# Patient Record
Sex: Male | Born: 1955 | Race: White | Hispanic: No | Marital: Married | Smoking: Former smoker
Health system: Southern US, Community
[De-identification: ages and names within clinical notes are randomized; demographics above are authoritative.]

## PROBLEM LIST (undated history)

## (undated) DIAGNOSIS — C801 Malignant (primary) neoplasm, unspecified: Secondary | ICD-10-CM

## (undated) DIAGNOSIS — K5150 Left sided colitis without complications: Secondary | ICD-10-CM

## (undated) DIAGNOSIS — K648 Other hemorrhoids: Secondary | ICD-10-CM

## (undated) DIAGNOSIS — K219 Gastro-esophageal reflux disease without esophagitis: Secondary | ICD-10-CM

## (undated) DIAGNOSIS — E559 Vitamin D deficiency, unspecified: Secondary | ICD-10-CM

## (undated) DIAGNOSIS — E785 Hyperlipidemia, unspecified: Secondary | ICD-10-CM

## (undated) DIAGNOSIS — D649 Anemia, unspecified: Secondary | ICD-10-CM

## (undated) HISTORY — DX: Other hemorrhoids: K64.8

## (undated) HISTORY — DX: Left sided colitis without complications: K51.50

## (undated) HISTORY — DX: Vitamin D deficiency, unspecified: E55.9

## (undated) HISTORY — PX: PROSTATE SURGERY: SHX751

## (undated) HISTORY — PX: UPPER GASTROINTESTINAL ENDOSCOPY: SHX188

## (undated) HISTORY — PX: COLONOSCOPY: SHX174

## (undated) HISTORY — DX: Anemia, unspecified: D64.9

## (undated) HISTORY — DX: Hyperlipidemia, unspecified: E78.5

---

## 2001-10-12 ENCOUNTER — Emergency Department (HOSPITAL_COMMUNITY): Admission: EM | Admit: 2001-10-12 | Discharge: 2001-10-13 | Payer: Self-pay

## 2001-10-18 ENCOUNTER — Emergency Department (HOSPITAL_COMMUNITY): Admission: EM | Admit: 2001-10-18 | Discharge: 2001-10-18 | Payer: Self-pay | Admitting: *Deleted

## 2004-05-06 ENCOUNTER — Ambulatory Visit (HOSPITAL_COMMUNITY): Admission: RE | Admit: 2004-05-06 | Discharge: 2004-05-06 | Payer: Self-pay | Admitting: Internal Medicine

## 2004-05-06 IMAGING — CR DG CHEST 2V
2 series · 2 of 2 positions shown · non-contrast
Comparison: none

CLINICAL DATA: Cough.  Routine physical.
 CHEST TWO VIEWS
 No comparisons.
 The heart is not enlarged.  There is mild hyperinflation.  There is no infiltrate.  There is blunting of the left costophrenic angle which is most likely due to some scarring.
 There is a 5 mm density in the right mid lung overlying the right eighth rib posteriorly.  This appears larger than a vessel than would be expected to be in this area.  This could represent a nodule.  I would suggest a CT scan of the chest with attention to this area.  This could be performed without intravenous contrast.  
 IMPRESSION
 1.  Hyperinflation and COPD.
 2.  Blunting of the left costophrenic angle.  
 3.  Possible 5 mm nodule in the right mid lung.  Recommend CT of the chest.

[view not recorded (1 of 2)]
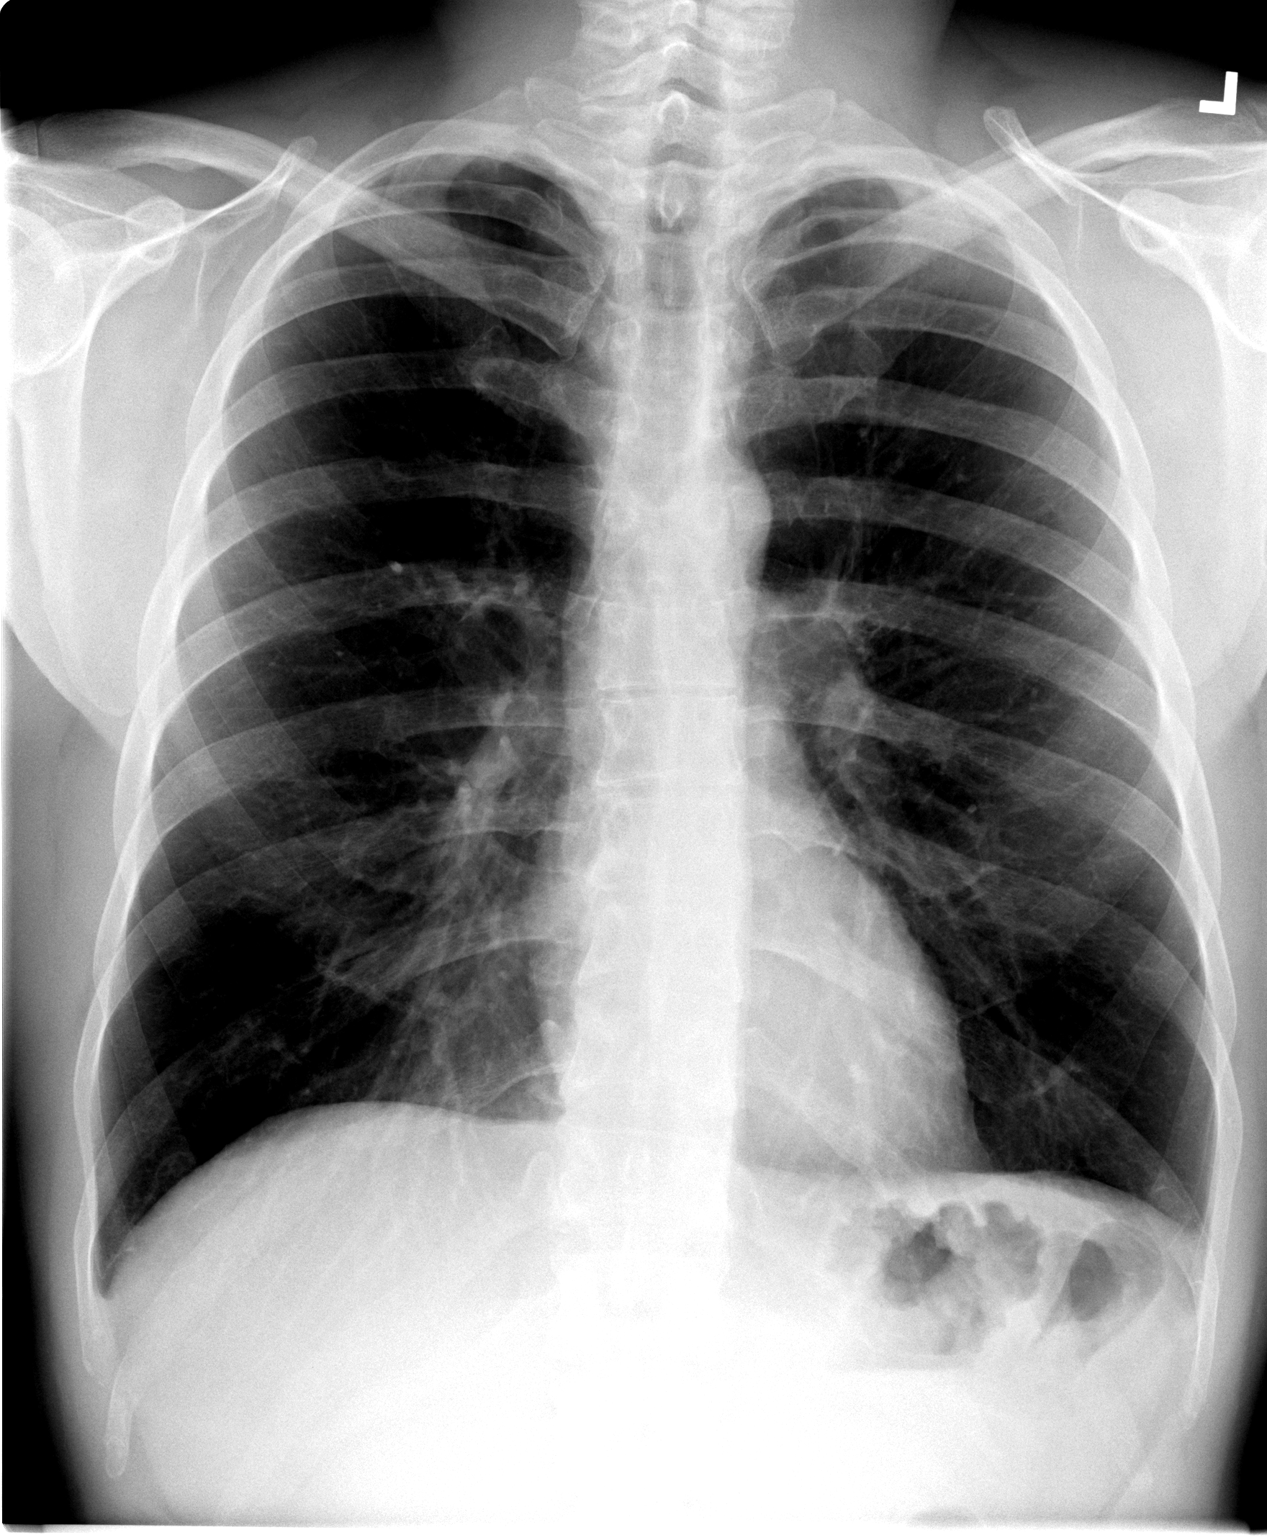

[view not recorded (2 of 2)]
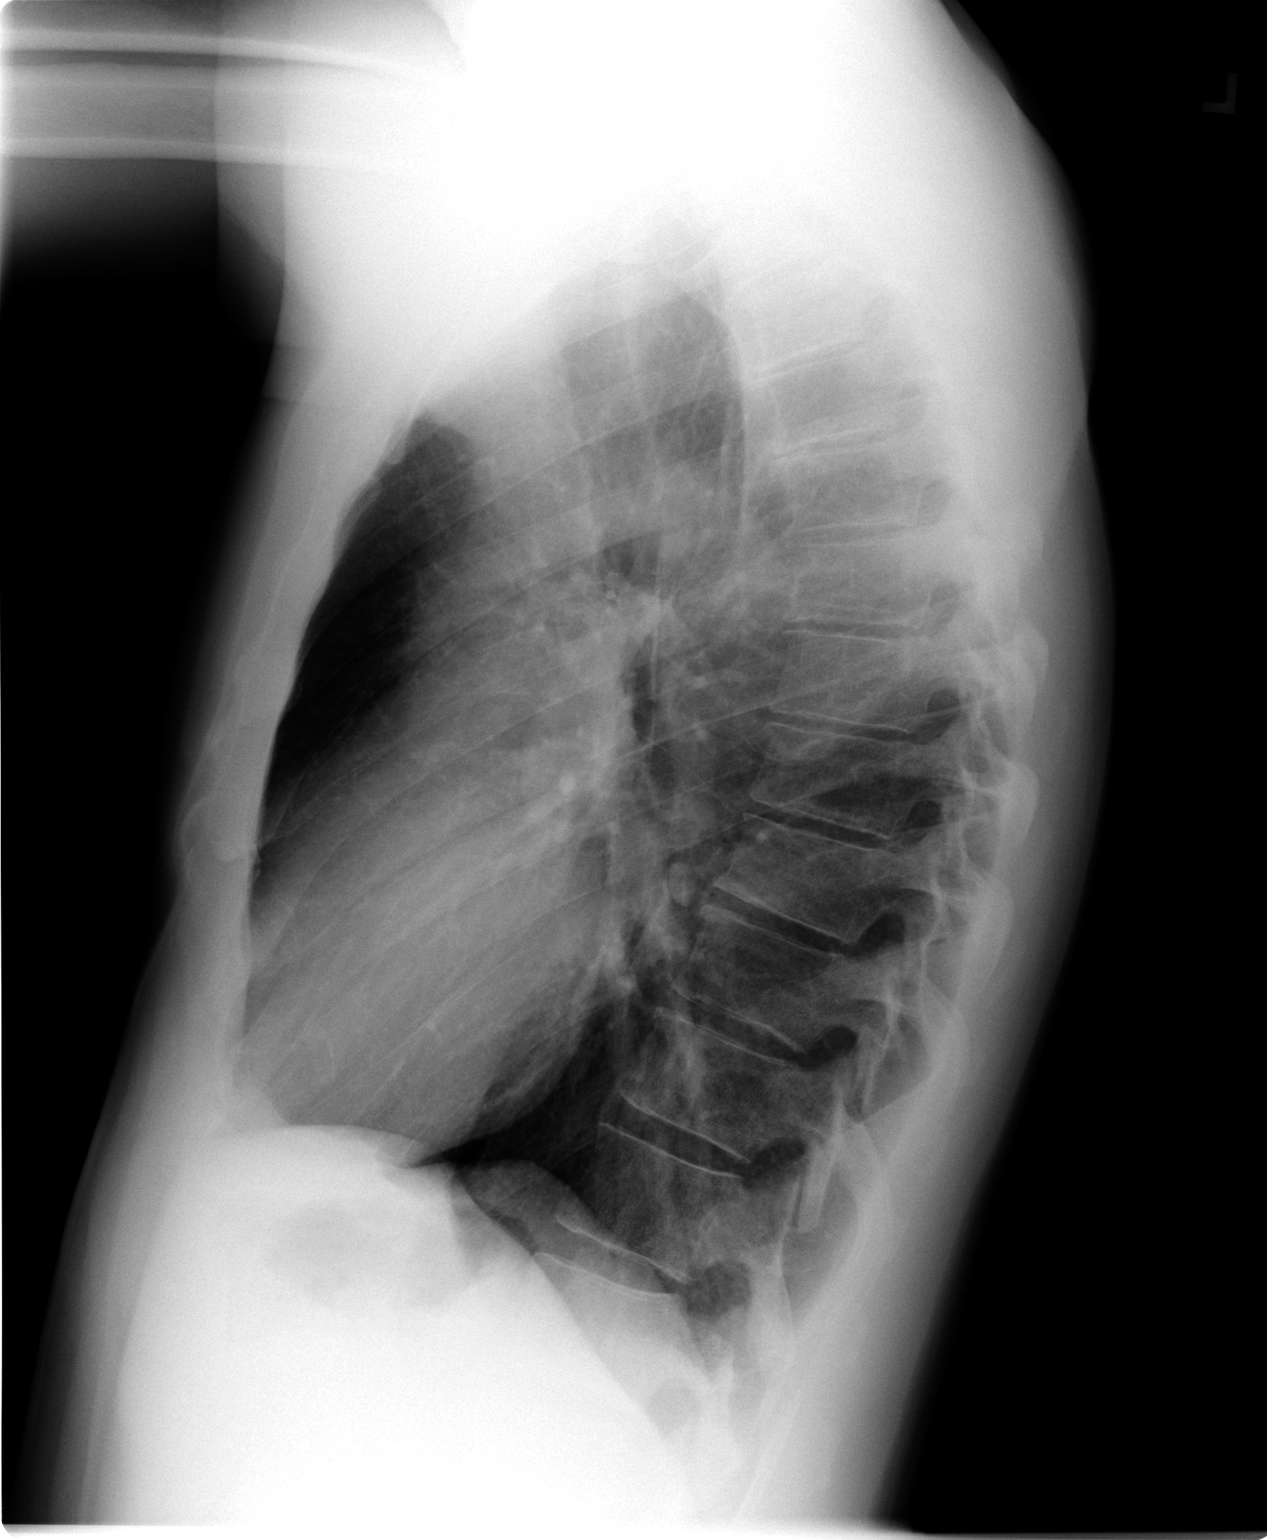

[2 of 2 positions shown; findings below may reference images not displayed]

## 2004-05-09 ENCOUNTER — Ambulatory Visit (HOSPITAL_COMMUNITY): Admission: RE | Admit: 2004-05-09 | Discharge: 2004-05-09 | Payer: Self-pay | Admitting: Internal Medicine

## 2004-05-09 IMAGING — CT CT CHEST W/O CM
1 of 2 series · 14 of 30 positions shown, 18 images · non-contrast
Comparison: none

CLINICAL DATA: Parenchymal nodule in the right mid lung on chest x-ray from [DATE].  
CT OF THE CHEST:
Comparison chest x-ray from [DATE].
Contiguous 5 mm axial images were obtained through the chest without IV contrast.  
There is no evidence of axillary, mediastinal, or hilar lymphadenopathy.  The heart size is normal.  There is no evidence for pericardial or pleural effusion.  
Lung windows demonstrate a 4 mm nodular opacity in the right mid lung, in very close proximity to the minor fissure.  This may actually be associated with the minor fissure itself.  Emphysema is noted in both lung apices.
Bones are unremarkable.

[Series 2: — · axial · 0.62mm/px · z∈[-296,-2]mm · 14 of 69 slices shown, 18 images]
[im 5/69  mediastinal]
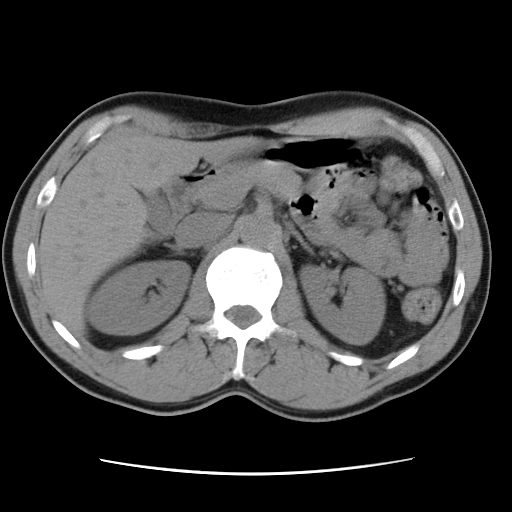
[im 5/69  lung]
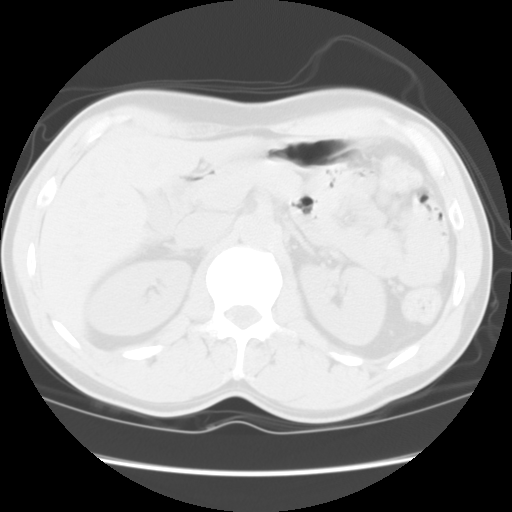
[im 10/69  lung]
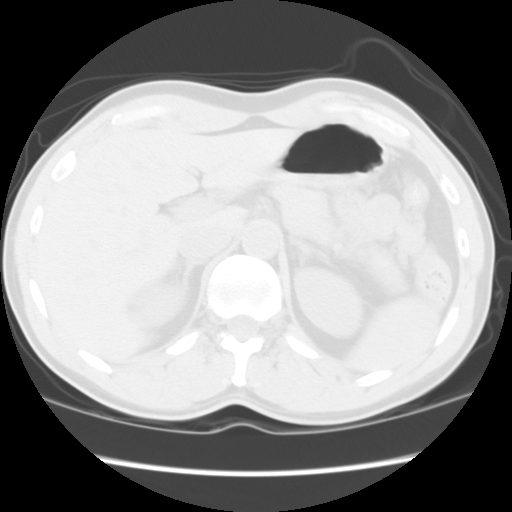
[im 15/69  lung]
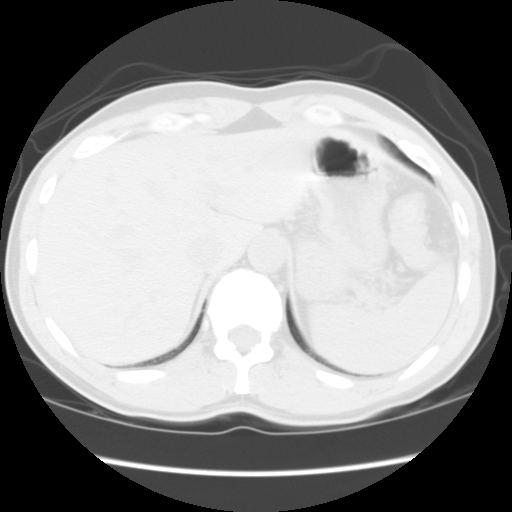
[im 20/69  lung]
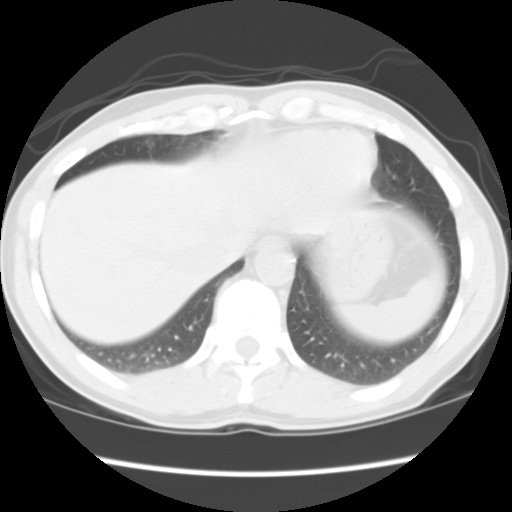
[im 25/69  mediastinal]
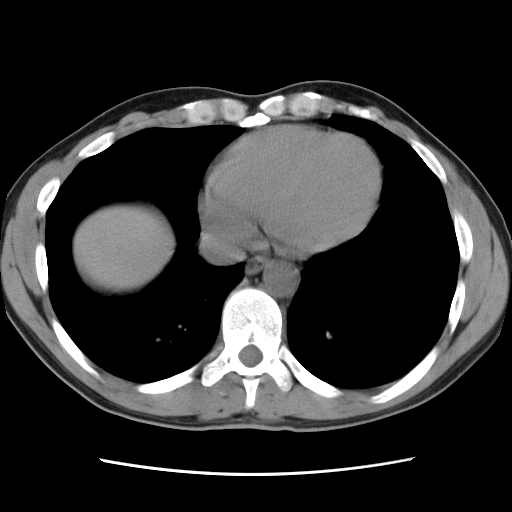
[im 25/69  lung]
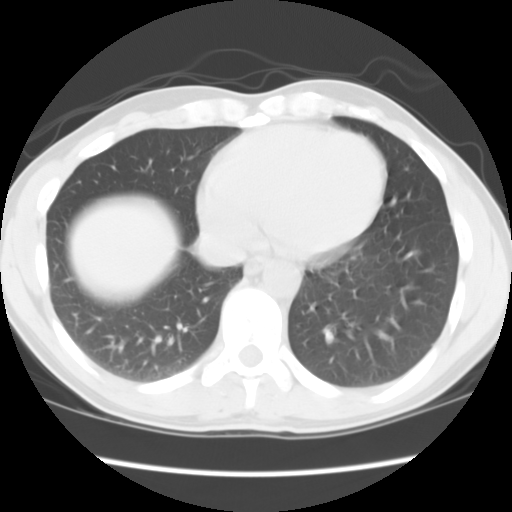
[im 30/69  lung]
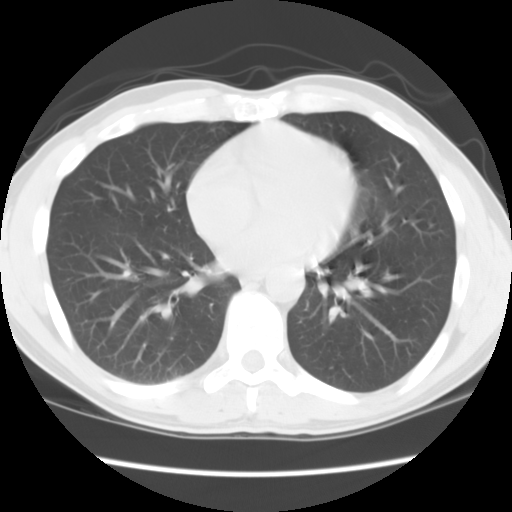
[im 33/69  lung]
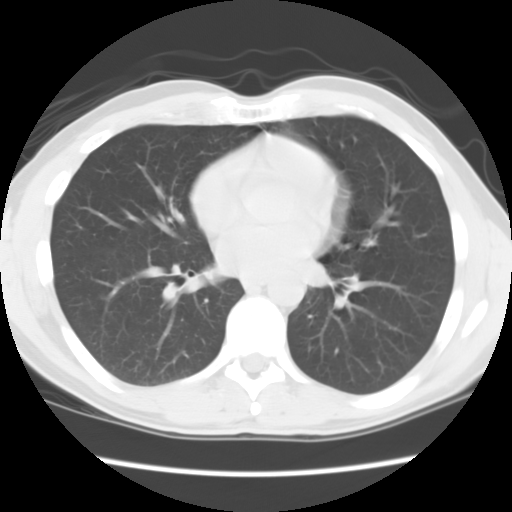
[im 35/69  lung]
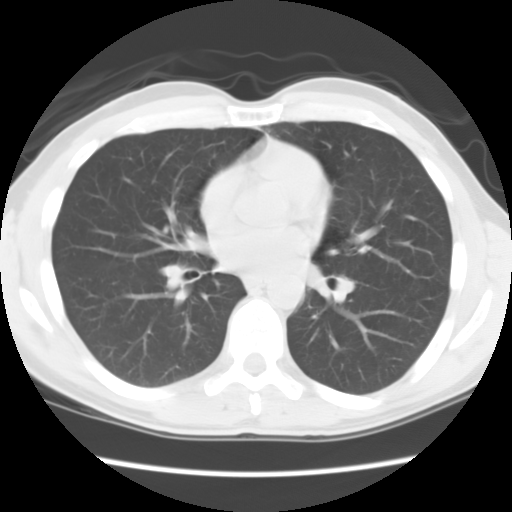
[im 39/69  mediastinal]
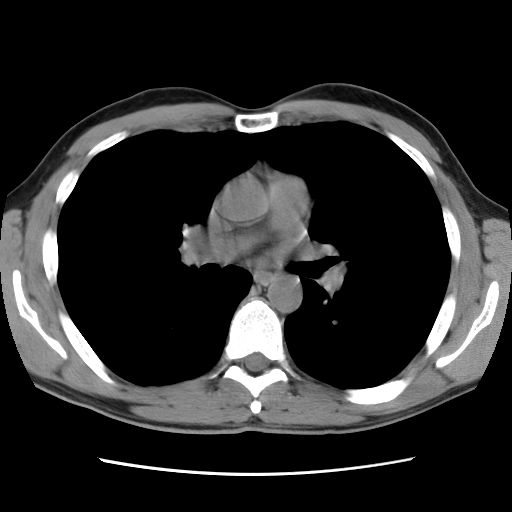
[im 39/69  lung]
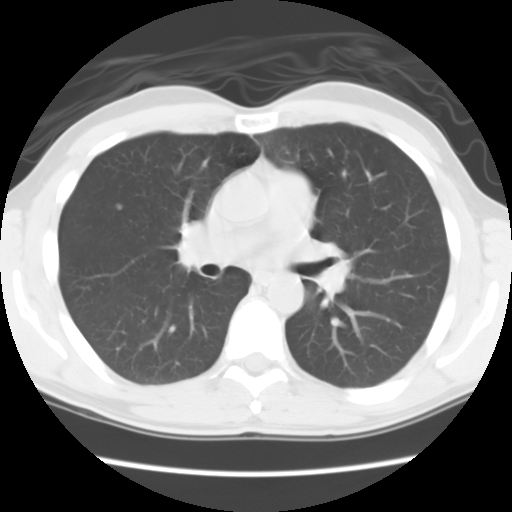
[im 44/69  lung]
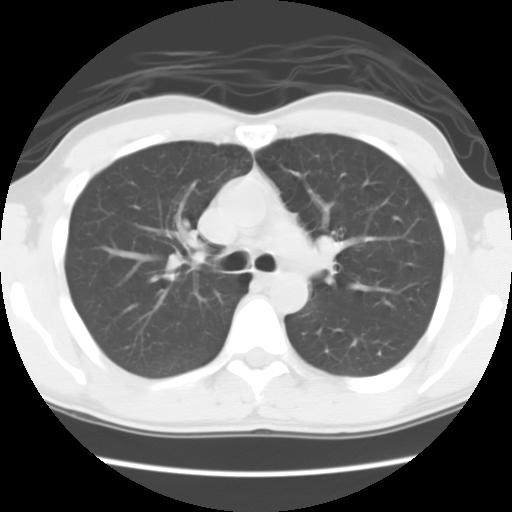
[im 49/69  lung]
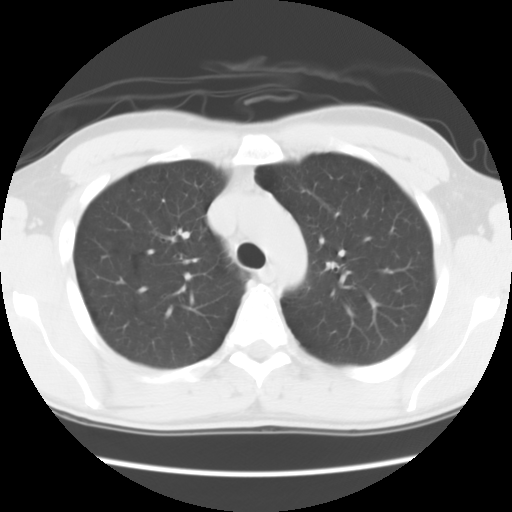
[im 54/69  lung]
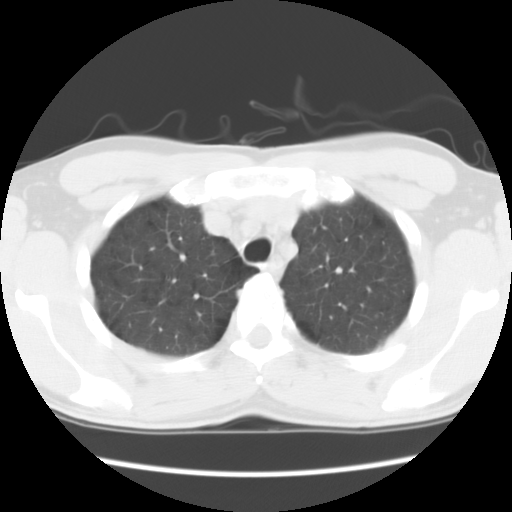
[im 59/69  mediastinal]
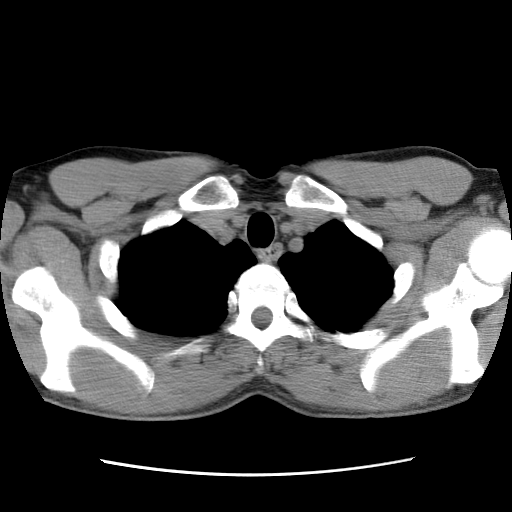
[im 59/69  lung]
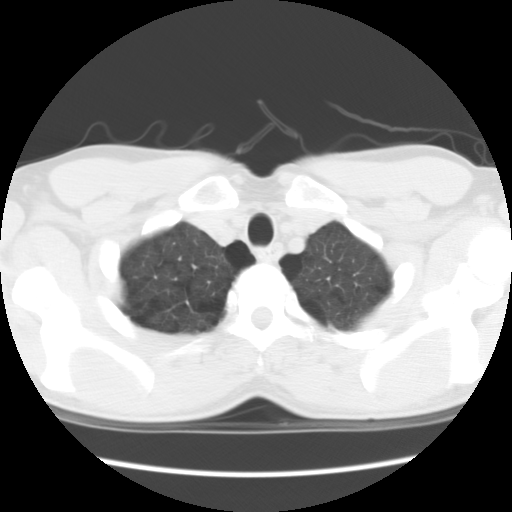
[im 64/69  lung]
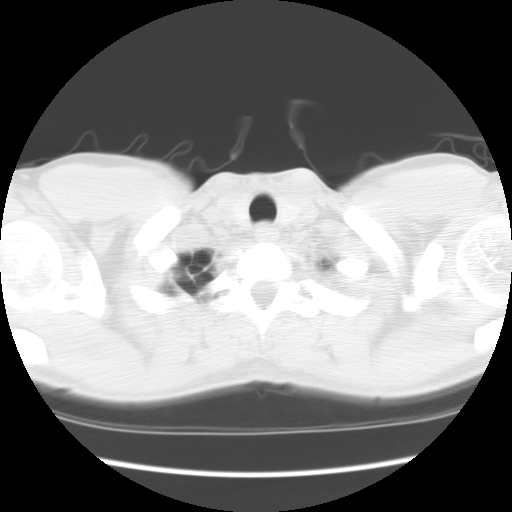

[14 of 30 positions shown; findings below may reference images not displayed]

IMPRESSION: There is a single tiny nodular density in the right mid lung with no associated calcification.  Emphysematous change in the upper lobes suggest a smoking history.  Even in the presence of a smoking history this is most likely benign, related to noncalcified granuloma or subpleural lymph node, but a follow-up CT scan in three months is recommended to insure that it remains stable as early bronchogenic neoplasm cannot be completely excluded.

## 2004-08-07 ENCOUNTER — Ambulatory Visit (HOSPITAL_COMMUNITY): Admission: RE | Admit: 2004-08-07 | Discharge: 2004-08-07 | Payer: Self-pay | Admitting: Internal Medicine

## 2004-08-07 IMAGING — CT CT CHEST W/O CM
1 of 2 series · 14 of 32 positions shown, 18 images · IV contrast (agent unspecified)
Comparison: none

CLINICAL DATA: Follow-up nodule in the right lung.
CHEST CT WITHOUT CONTRAST:
Comparison is made with the previous exam dated [DATE].
Spiral scanning was performed from the lung apex to the base of the diaphragm.  Both soft tissue and lung windows were obtained.
A normal noncontrasted appearance to the mediastinum is seen.  
The lung windows are notable for a focal nodular density identified in the right middle lobe which again appears to be closely opposed to the minor fissure.  On today?s exam this measures 4.4 mm and again shows no obvious calcification.  Given differences in scanning planes the size is felt to have remained stable since the previous exam and again this would be most compatible with a benign finding.  One further three month follow-up would be recommended to confirm stability.  The remainder of the lung fields demonstrate some early emphysematous changes with bleb formation in the upper lung zones bilaterally.  No other parenchymal abnormalities are seen.

[Series 2: — · axial · 0.61mm/px · z∈[-283,-23]mm · 14 of 63 slices shown, 18 images]
[im 5/63  mediastinal]
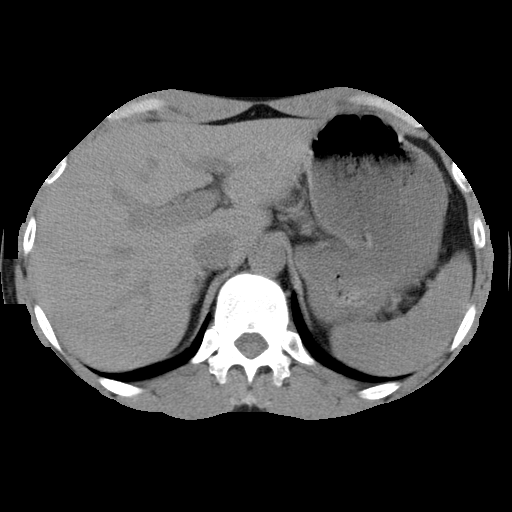
[im 5/63  lung]
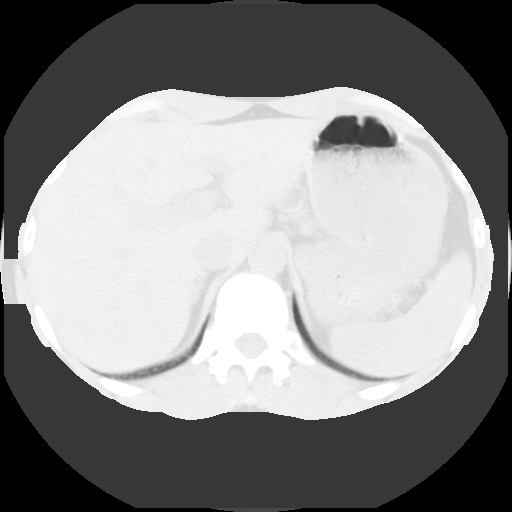
[im 10/63  lung]
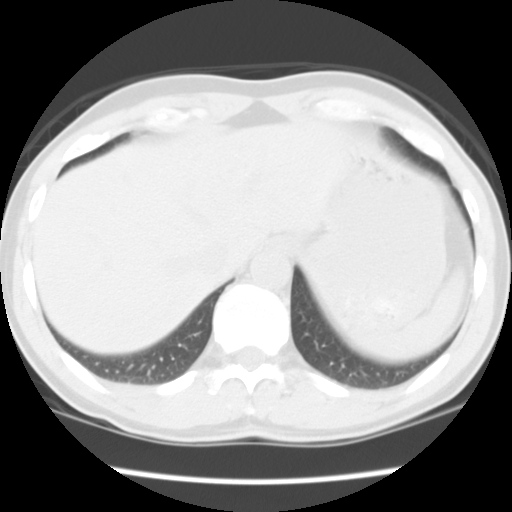
[im 15/63  lung]
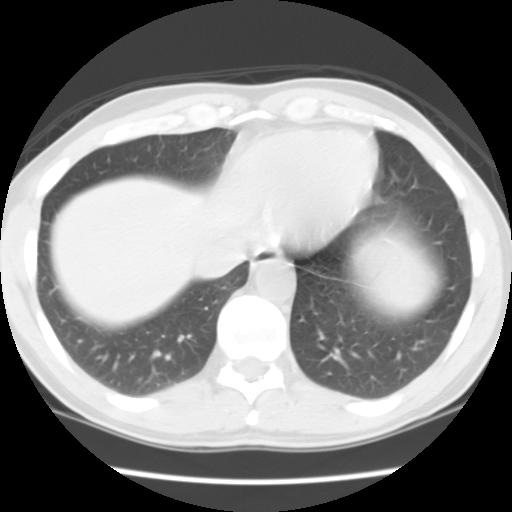
[im 20/63  lung]
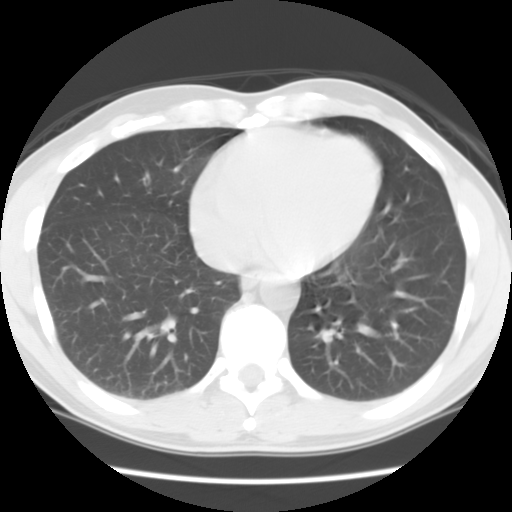
[im 24/63  mediastinal]
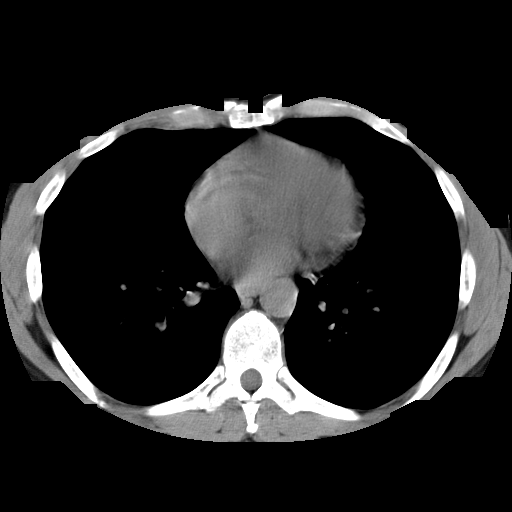
[im 24/63  lung]
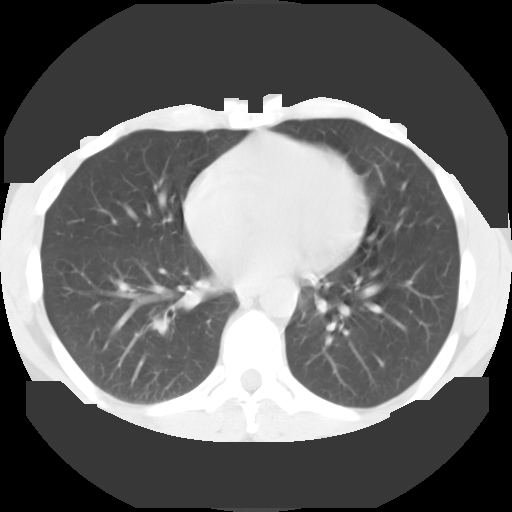
[im 29/63  lung]
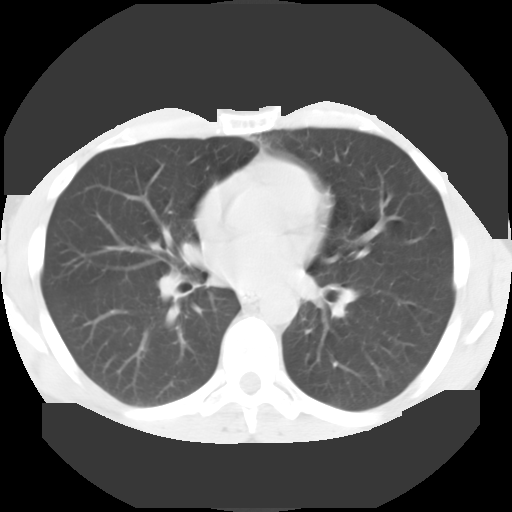
[im 30/63  lung]
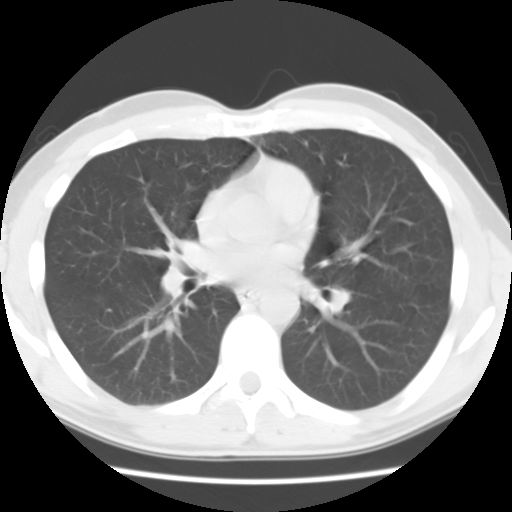
[im 32/63  lung]
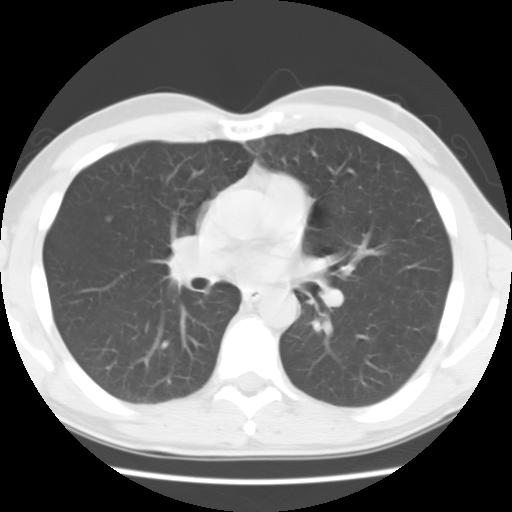
[im 34/63  mediastinal]
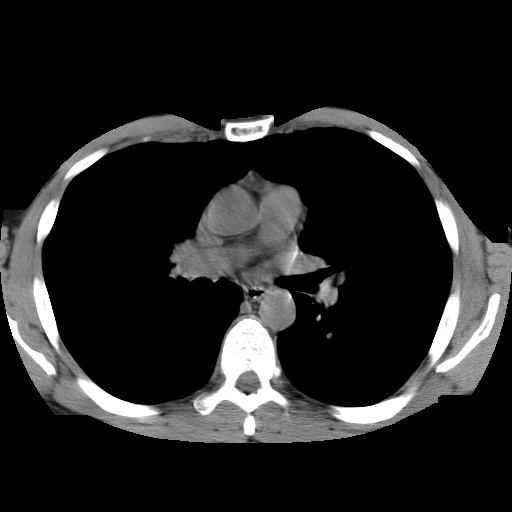
[im 34/63  lung]
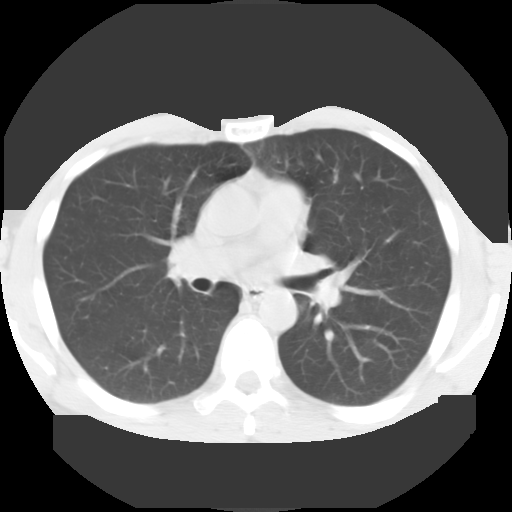
[im 39/63  lung]
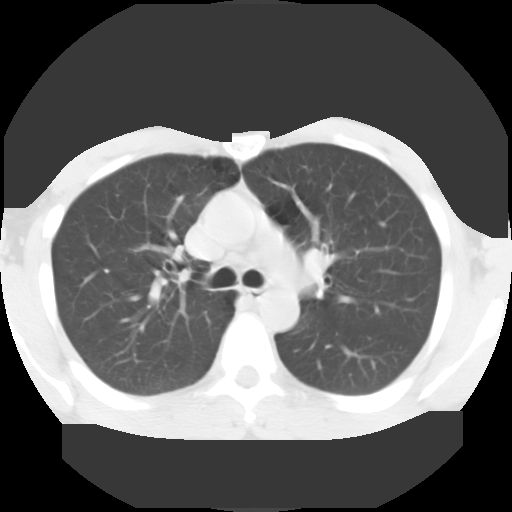
[im 43/63  lung]
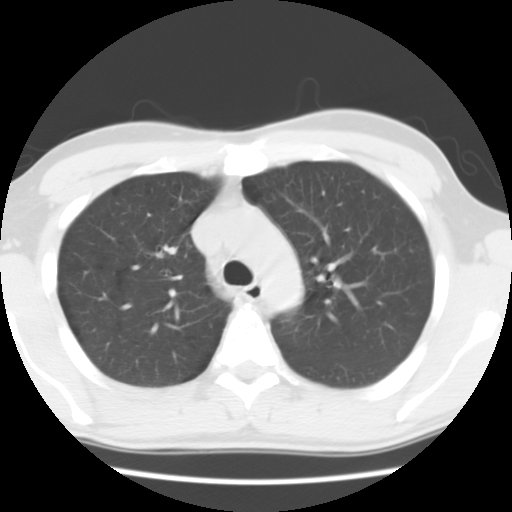
[im 48/63  lung]
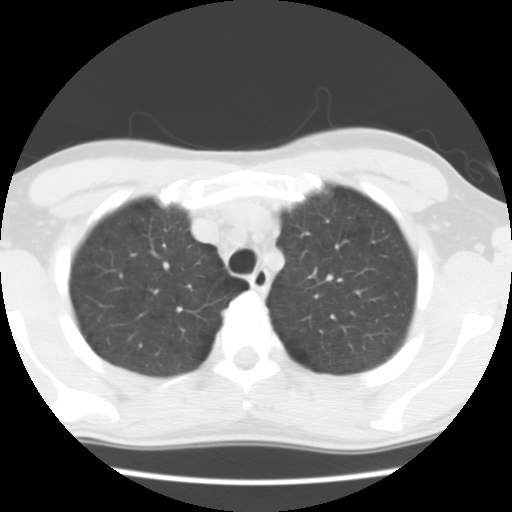
[im 53/63  mediastinal]
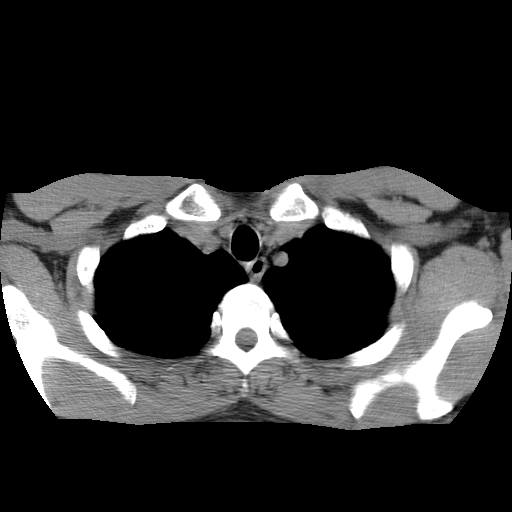
[im 53/63  lung]
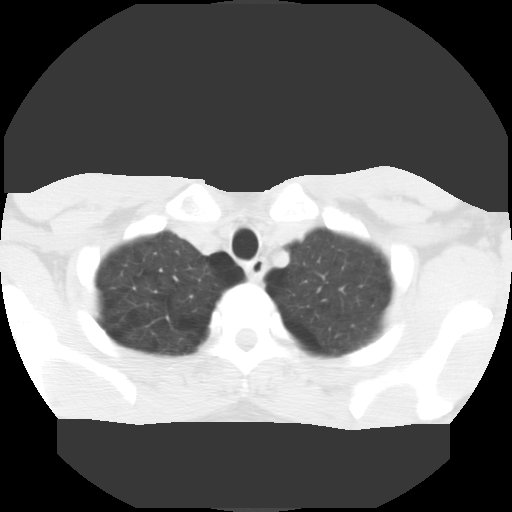
[im 58/63  lung]
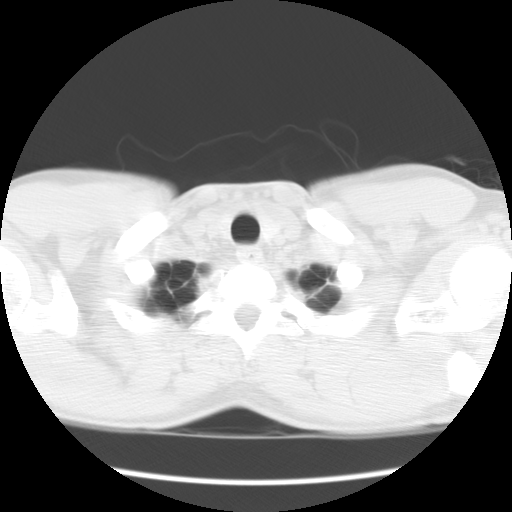

[14 of 32 positions shown; findings below may reference images not displayed]

IMPRESSION: Stable right middle lobe, non-calcified nodule most suggestive of a benign process.  One further three month follow-up is recommended to confirm stability.  Mild apical emphysematous changes.

## 2004-12-05 ENCOUNTER — Ambulatory Visit (HOSPITAL_COMMUNITY): Admission: RE | Admit: 2004-12-05 | Discharge: 2004-12-05 | Payer: Self-pay | Admitting: Internal Medicine

## 2004-12-05 IMAGING — CT CT CHEST W/O CM
1 series · 16 of 33 positions shown, 20 images · IV contrast (agent unspecified)
Comparison: [DATE].

CLINICAL DATA: Follow-up right mid lung nodule.
TECHNIQUE: Multi sized axial images were obtained through the chest, ordered and performed without contrast material.   
CT CHEST WITHOUT CONTRAST:

[Series 2: chest w/0 cm · axial · 0.66mm/px · z∈[-322,-12]mm · 16 of 68 slices shown, 20 images]
[im 3/68  mediastinal]
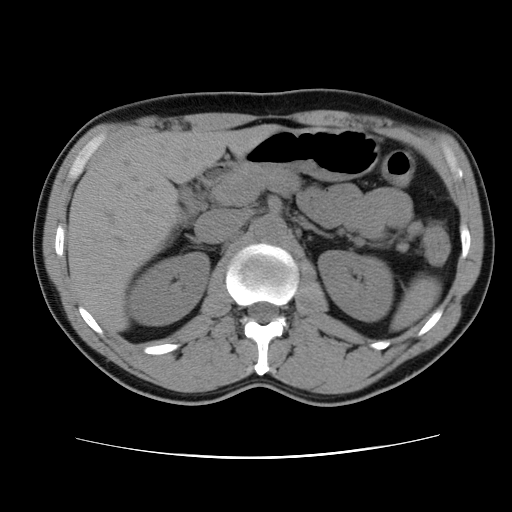
[im 3/68  lung]
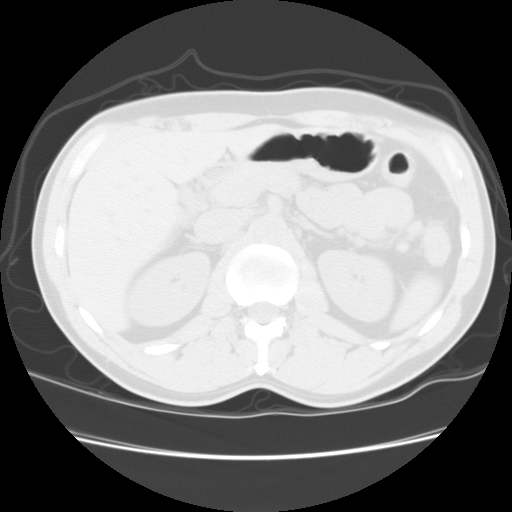
[im 8/68  lung]
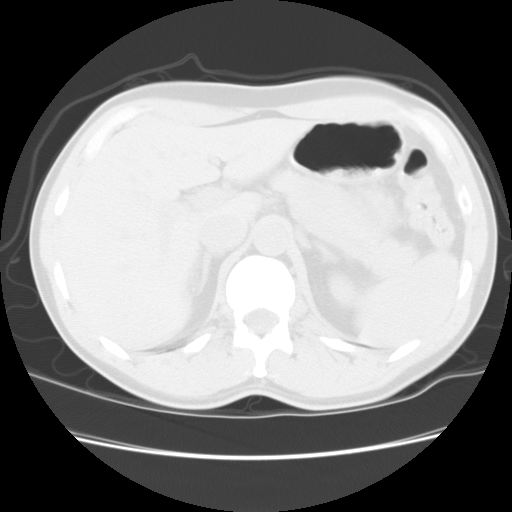
[im 13/68  lung]
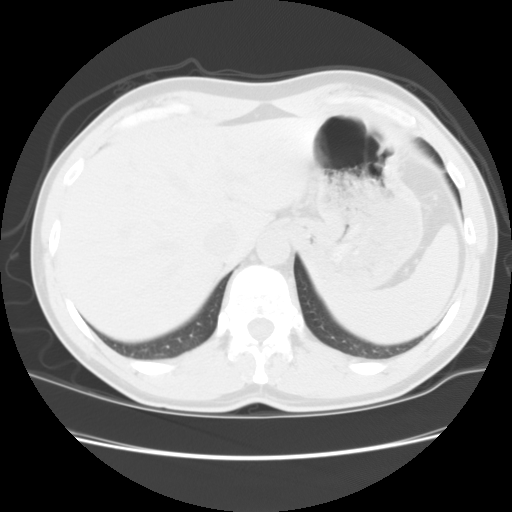
[im 15/68  lung]
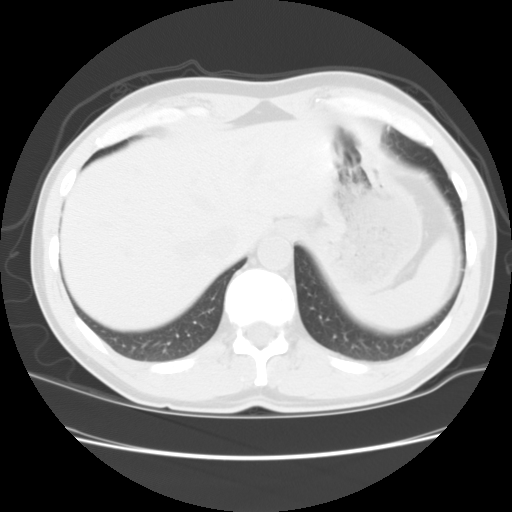
[im 20/68  mediastinal]
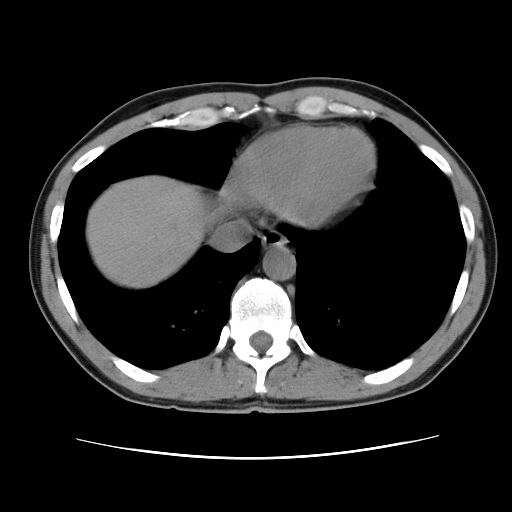
[im 20/68  lung]
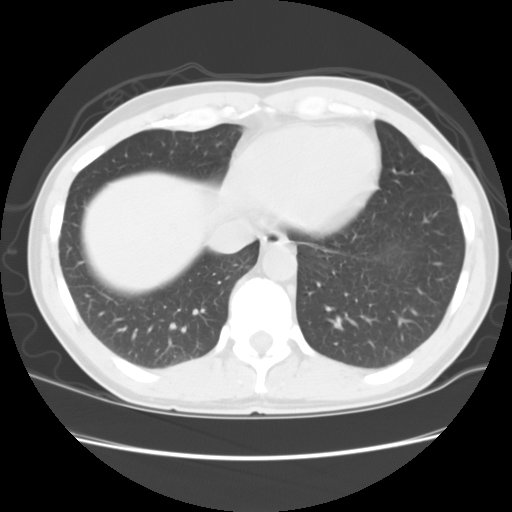
[im 25/68  lung]
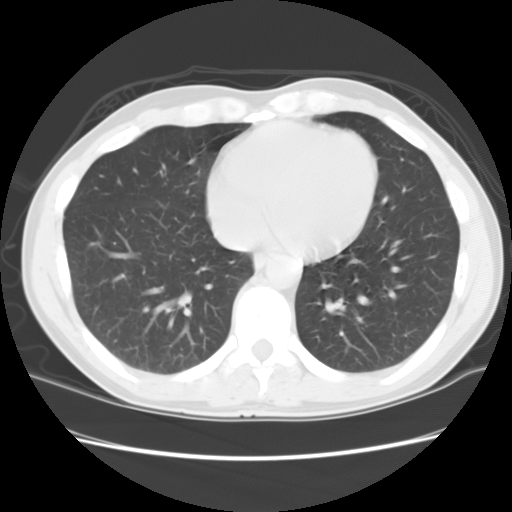
[im 28/68  lung]
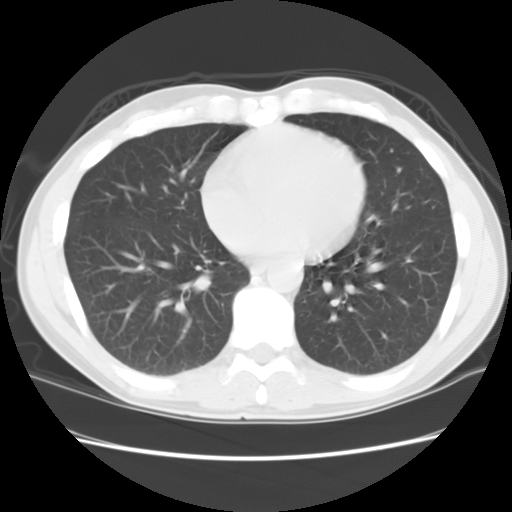
[im 33/68  lung]
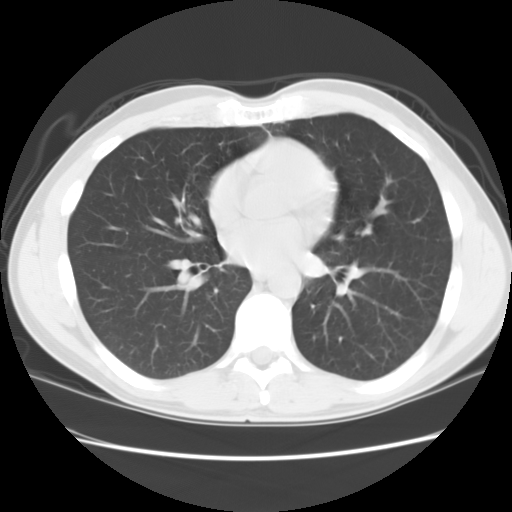
[im 36/68  mediastinal]
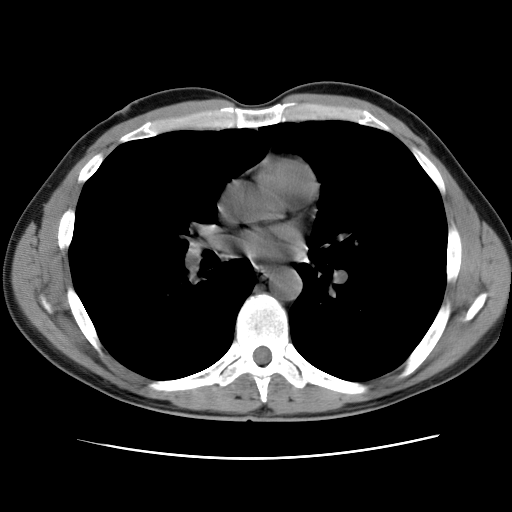
[im 36/68  lung]
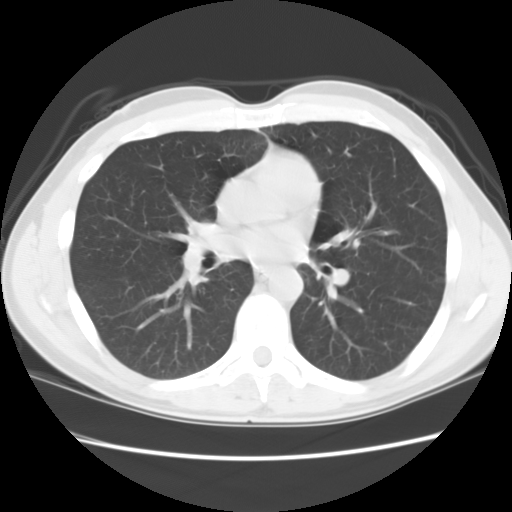
[im 40/68  lung]
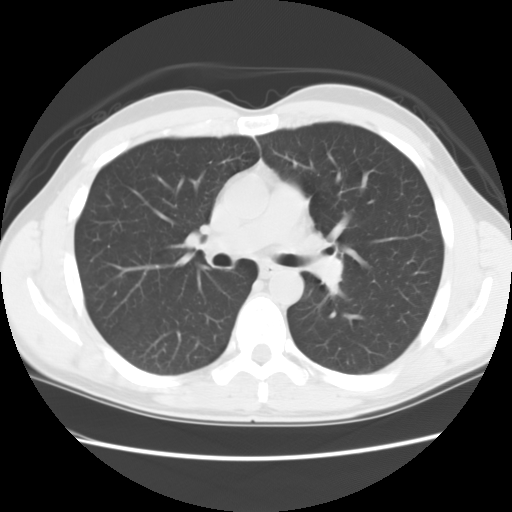
[im 43/68  lung]
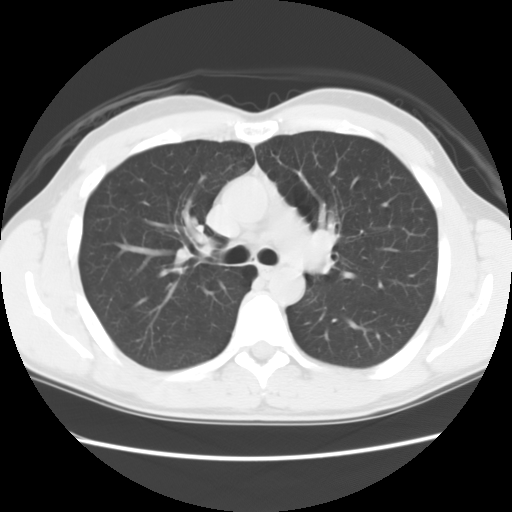
[im 48/68  lung]
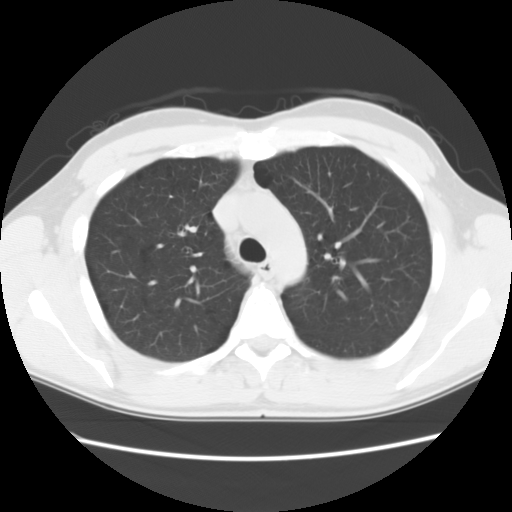
[im 53/68  mediastinal]
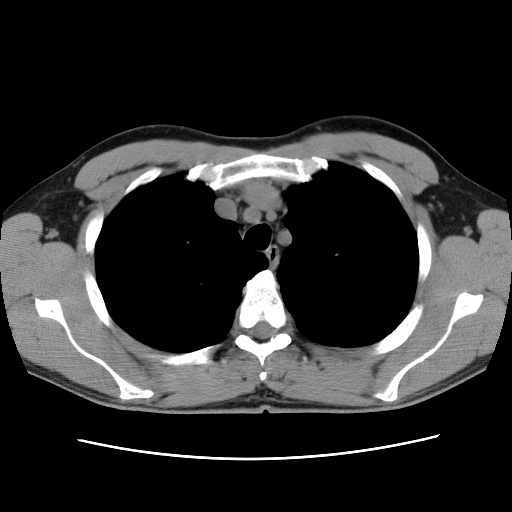
[im 53/68  lung]
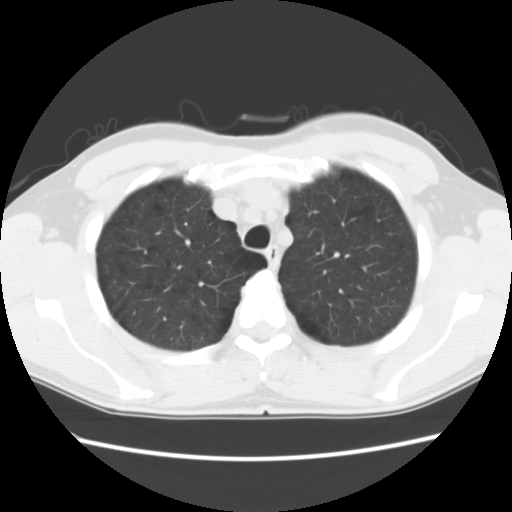
[im 55/68  lung]
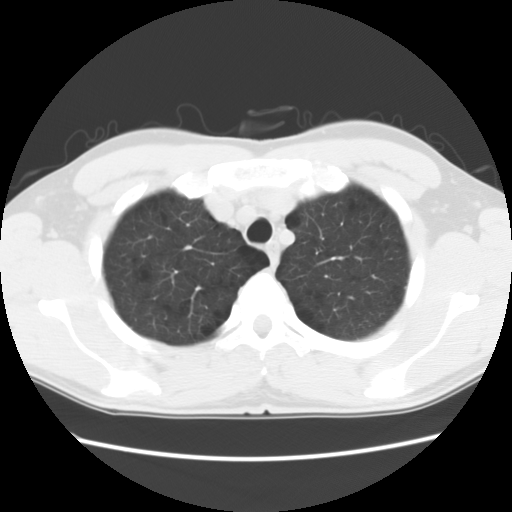
[im 60/68  lung]
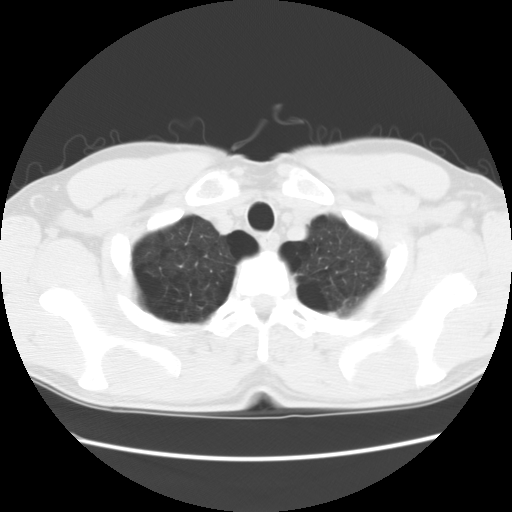
[im 65/68  lung]
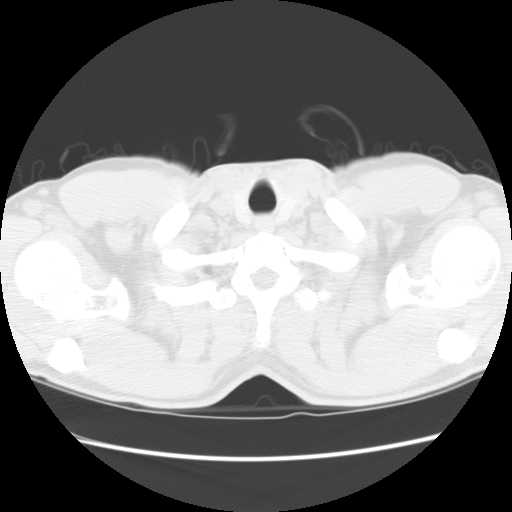

[16 of 33 positions shown; findings below may reference images not displayed]

There is a 4.3 mm noncalcified nodule in the right  mid lung which is unchanged.   No additional nodules are seen bilaterally.  The mediastinum, hilar regions and axilla have an unremarkable noncontrast appearance.   Both lungs are clear of infiltrate and effusion.  The visualized upper abdomen is unremarkable.   The osseous structures have a normal appearance.
IMPRESSION: Stable 4.3 mm noncalcified nodule in the right mid lung.  An additional follow-up with CT in 6 months is recommended for continued documentation of stability.

## 2005-01-20 ENCOUNTER — Ambulatory Visit: Payer: Self-pay | Admitting: Internal Medicine

## 2005-02-02 ENCOUNTER — Ambulatory Visit: Payer: Self-pay | Admitting: Internal Medicine

## 2005-02-02 ENCOUNTER — Encounter (INDEPENDENT_AMBULATORY_CARE_PROVIDER_SITE_OTHER): Payer: Self-pay | Admitting: Specialist

## 2005-05-18 ENCOUNTER — Ambulatory Visit (HOSPITAL_COMMUNITY): Admission: RE | Admit: 2005-05-18 | Discharge: 2005-05-18 | Payer: Self-pay | Admitting: Internal Medicine

## 2005-05-18 IMAGING — CT CT CHEST W/O CM
1 series · 15 of 33 positions shown, 19 images · IV contrast (agent unspecified)
Comparison: none

CLINICAL DATA: Reevaluate right mid-lobe lung nodule. 
CHEST CT WITHOUT CONTRAST:
TECHNIQUE: Multidetector CT imaging of the chest was performed following the standard protocol without IV contrast.
Comparison is made with the previous exam dated [DATE] and [DATE].
Spiral scanning was performed from the lung apex to the dome of the diaphragm.  Both soft tissue and lung windows were obtained.
The mediastinum and hilar regions have a normal noncontrasted appearance.  The adrenal glands, visualized portion of the liver, pancreas, and kidneys also have a normal noncontrasted appearance.  Lung windows demonstrate mild emphysematous changes predominantly in the upper lung zones.  Some mild scarring at the lung apices is seen and is stable.  Again noted in the right middle lobe is a solitary pulmonary nodule which measures 4.3 mm when measured in a similar plane as the previous CTs.  This represents no interval change in size or appearance dating back to [DATE] and would be compatible with a benign process.  No further follow-up is felt necessary.  No other focal parenchymal nodularity is seen.

[Series 2: chest w/ · axial · 0.66mm/px · z∈[-320,-30]mm · 15 of 68 slices shown, 19 images]
[im 5/68  mediastinal]
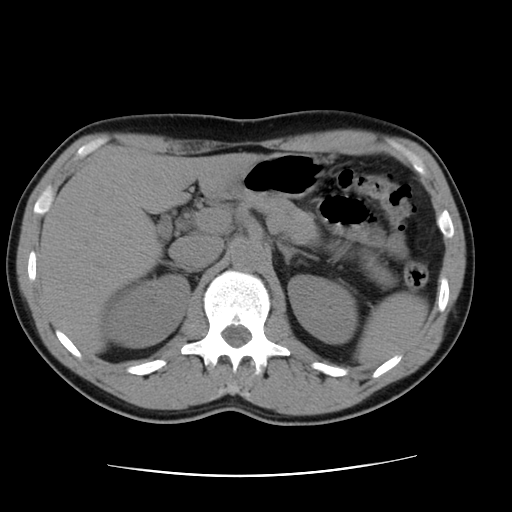
[im 5/68  lung]
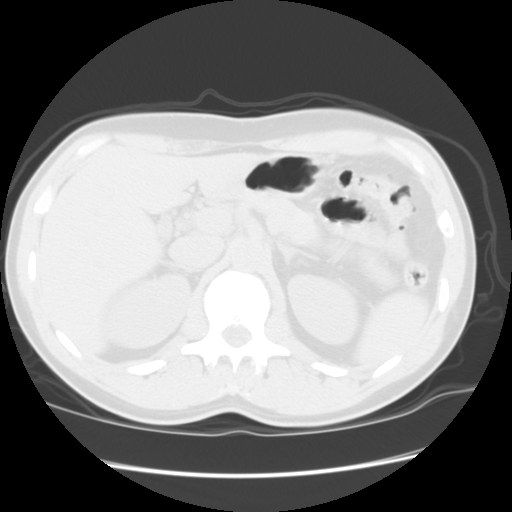
[im 10/68  lung]
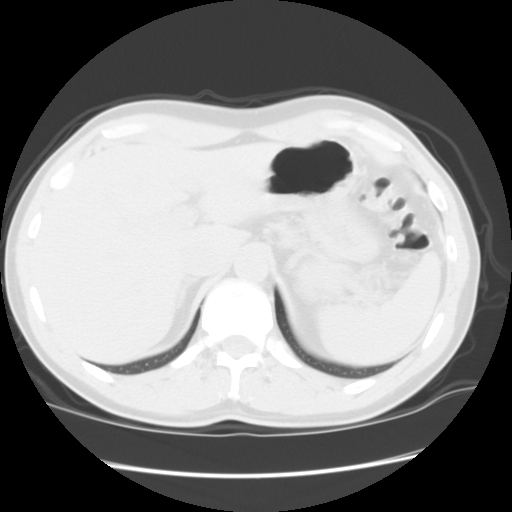
[im 14/68  lung]
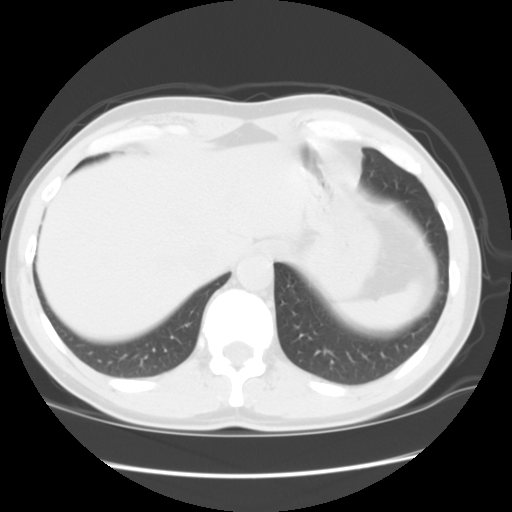
[im 18/68  lung]
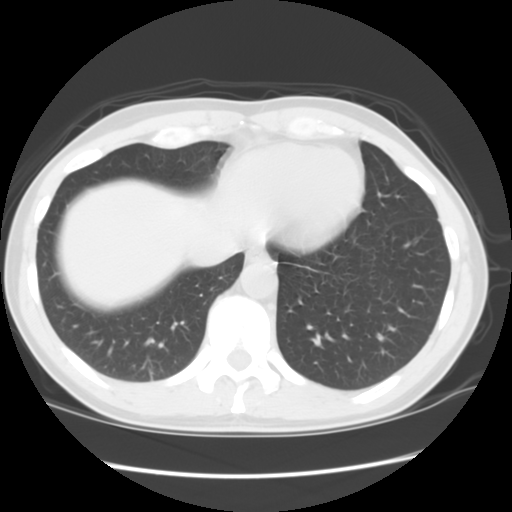
[im 23/68  mediastinal]
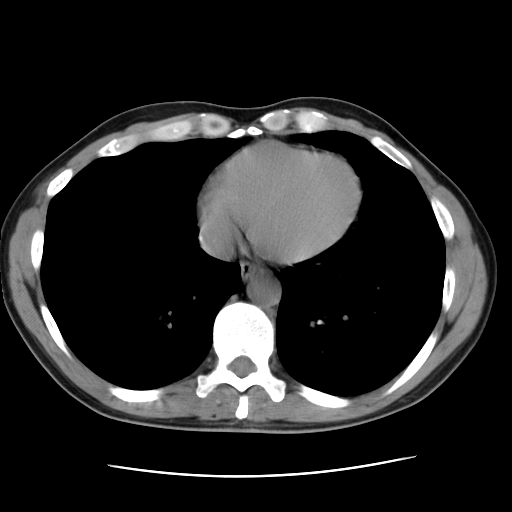
[im 23/68  lung]
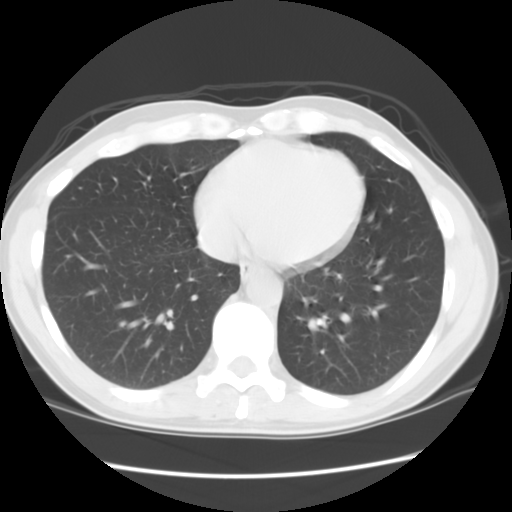
[im 27/68  lung]
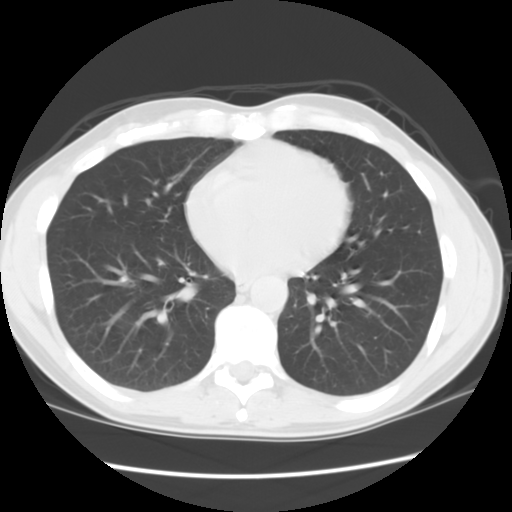
[im 30/68  lung]
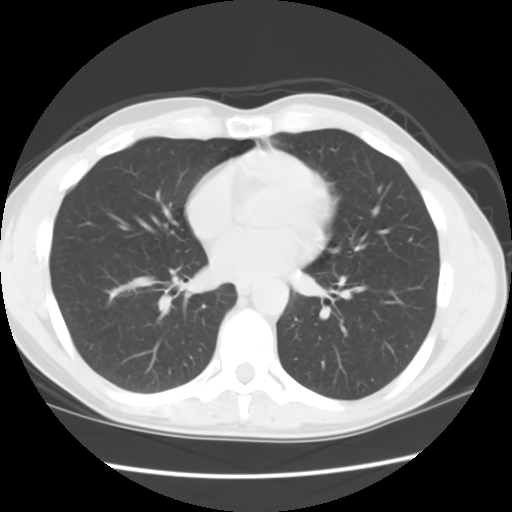
[im 35/68  lung]
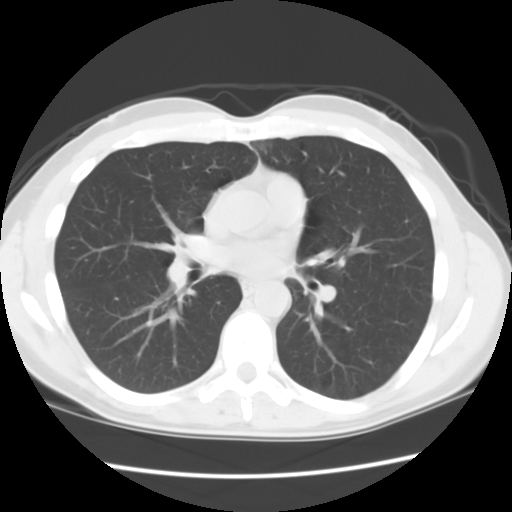
[im 38/68  mediastinal]
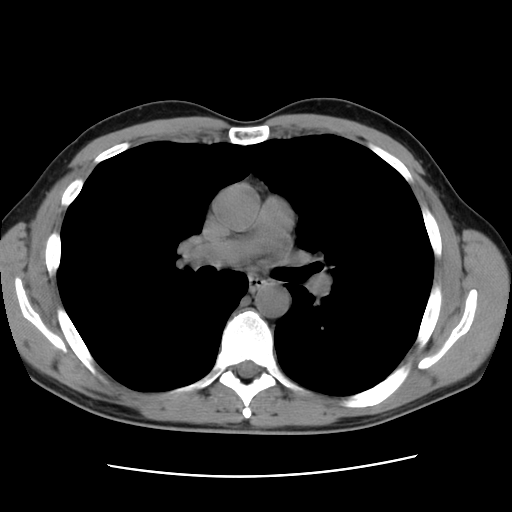
[im 38/68  lung]
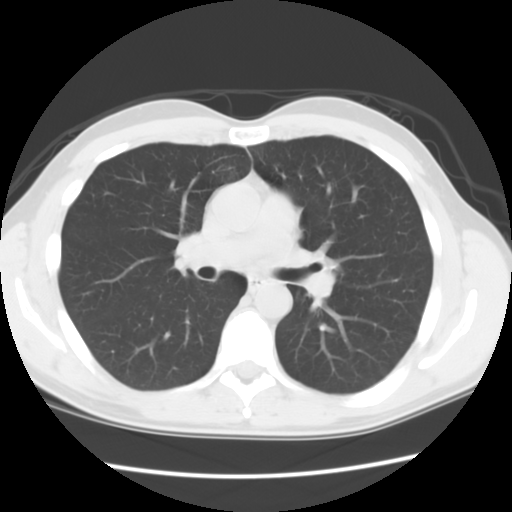
[im 41/68  lung]
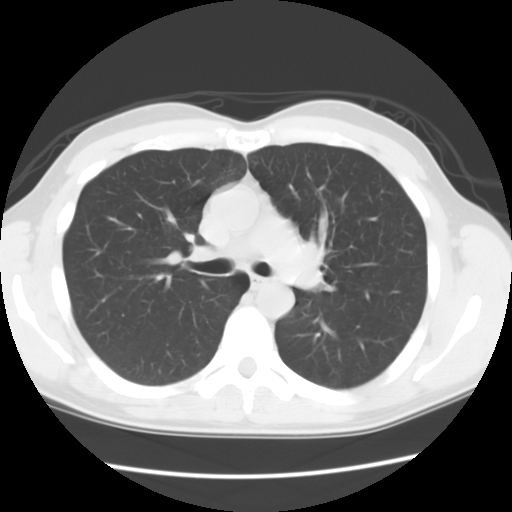
[im 45/68  lung]
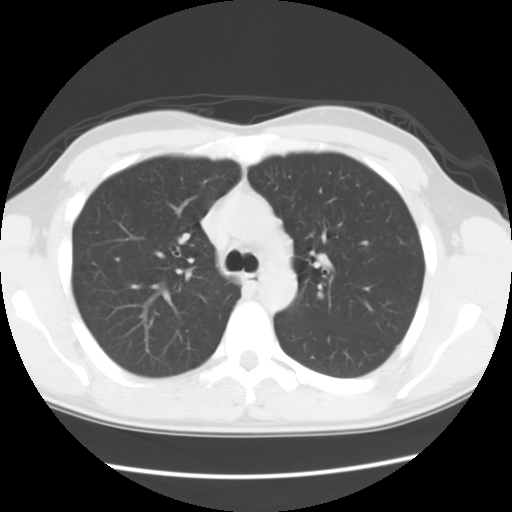
[im 50/68  lung]
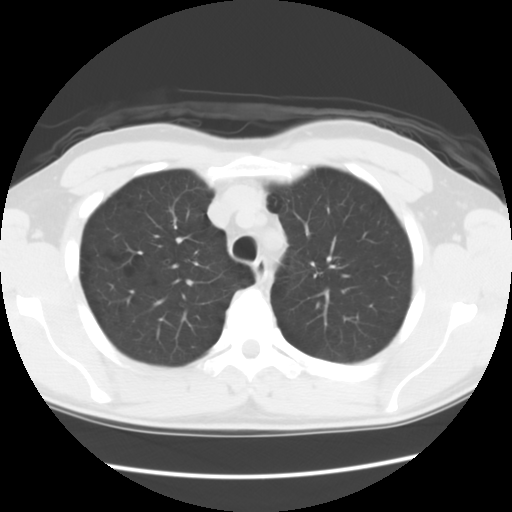
[im 54/68  mediastinal]
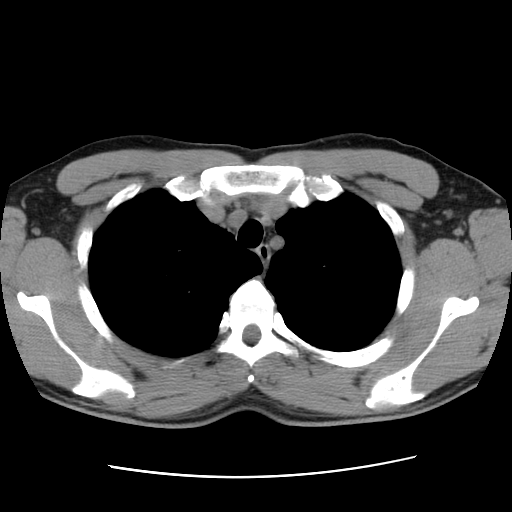
[im 54/68  lung]
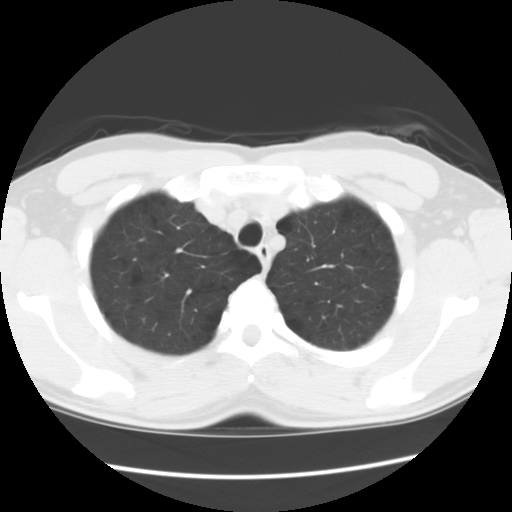
[im 58/68  lung]
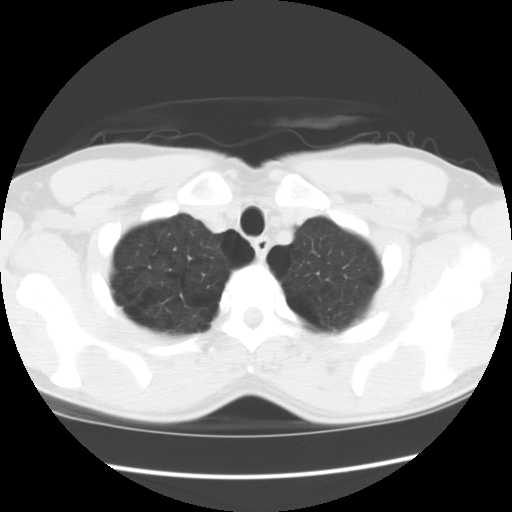
[im 63/68  lung]
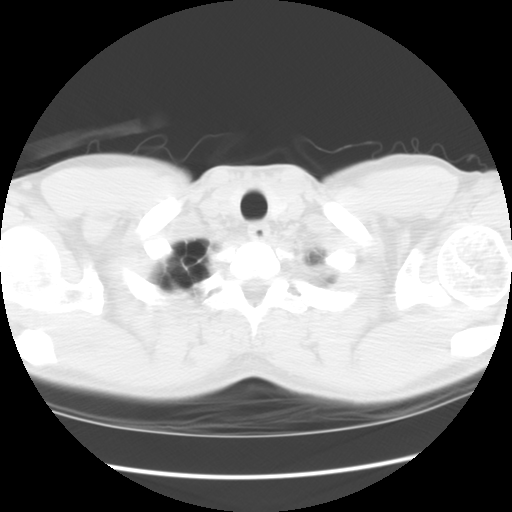

[15 of 33 positions shown; findings below may reference images not displayed]

IMPRESSION: Stable appearance to right middle lobe nodule compatible with a benign etiology.  Stable mild emphysematous changes.

## 2006-09-08 ENCOUNTER — Ambulatory Visit (HOSPITAL_COMMUNITY): Admission: RE | Admit: 2006-09-08 | Discharge: 2006-09-08 | Payer: Self-pay | Admitting: Internal Medicine

## 2007-01-12 IMAGING — CR DG CHEST 2V
2 series · 2 of 2 positions shown · non-contrast
Comparison: [DATE].

CLINICAL DATA: Annual physical.
 CHEST - 2 VIEW:

[view not recorded (1 of 2)]
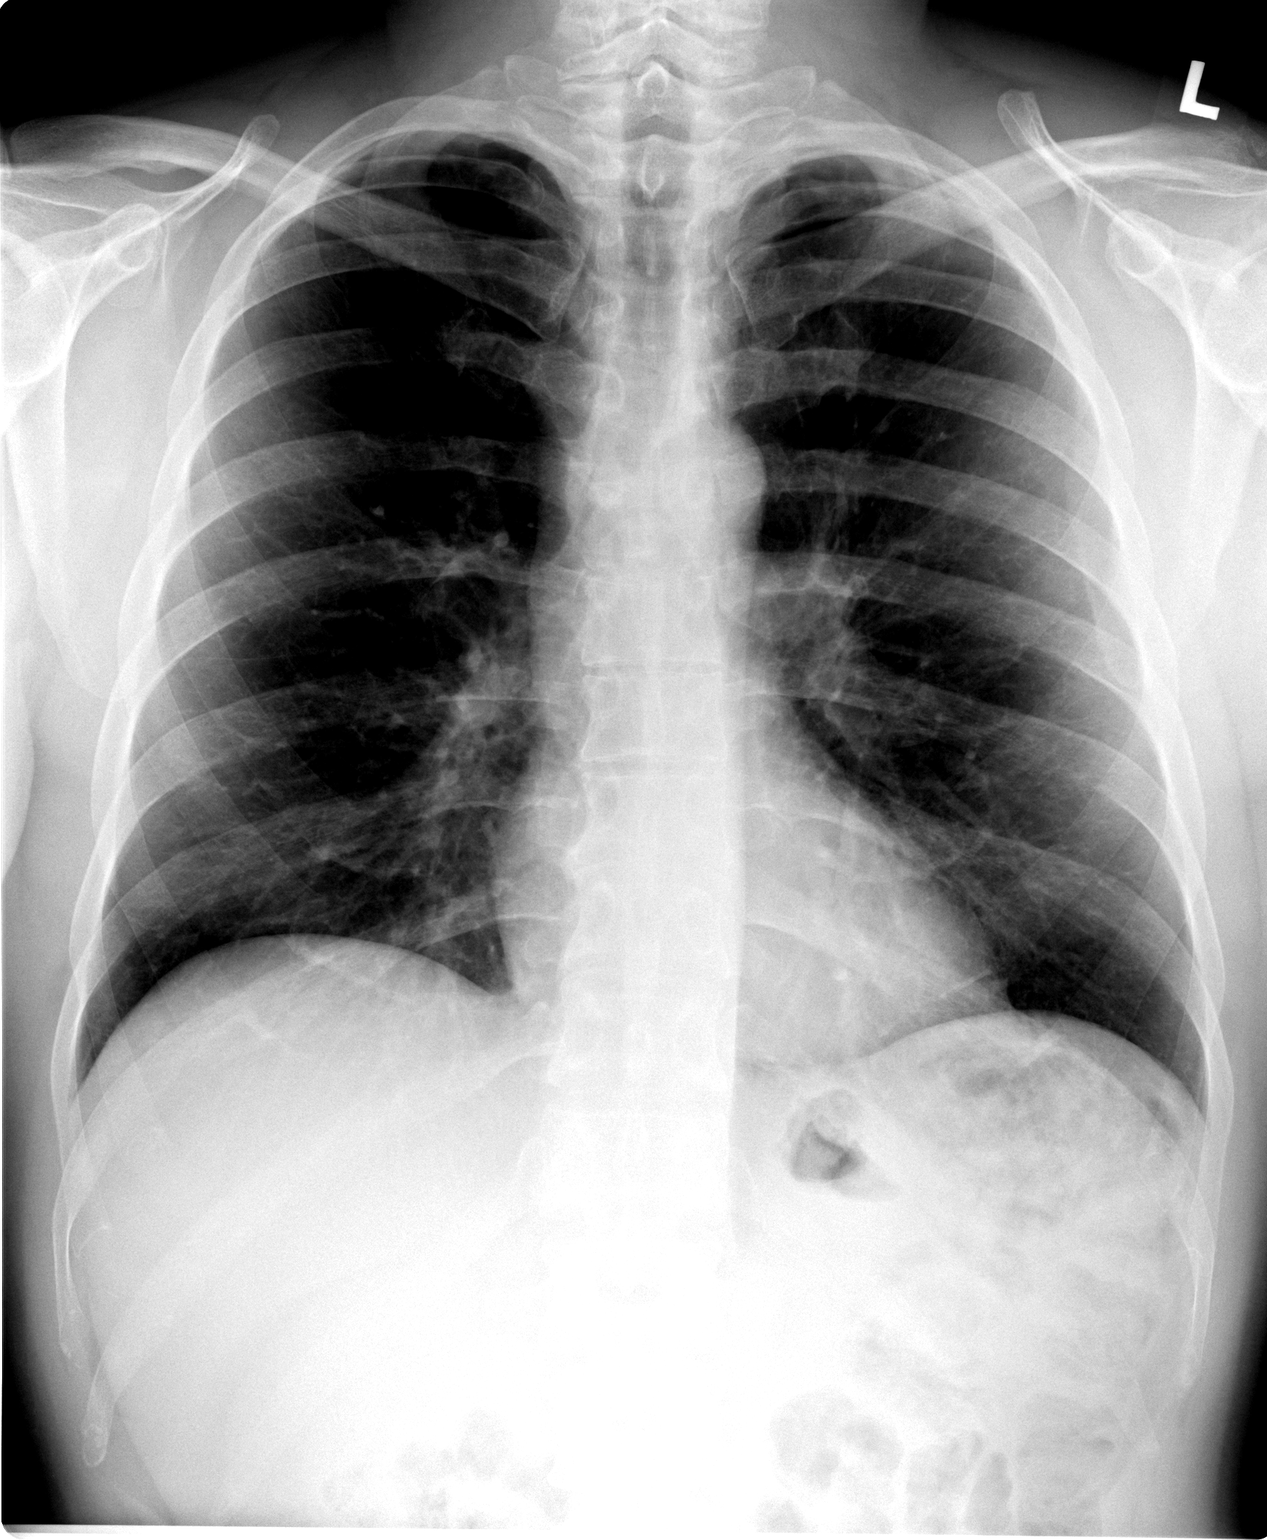

[view not recorded (2 of 2)]
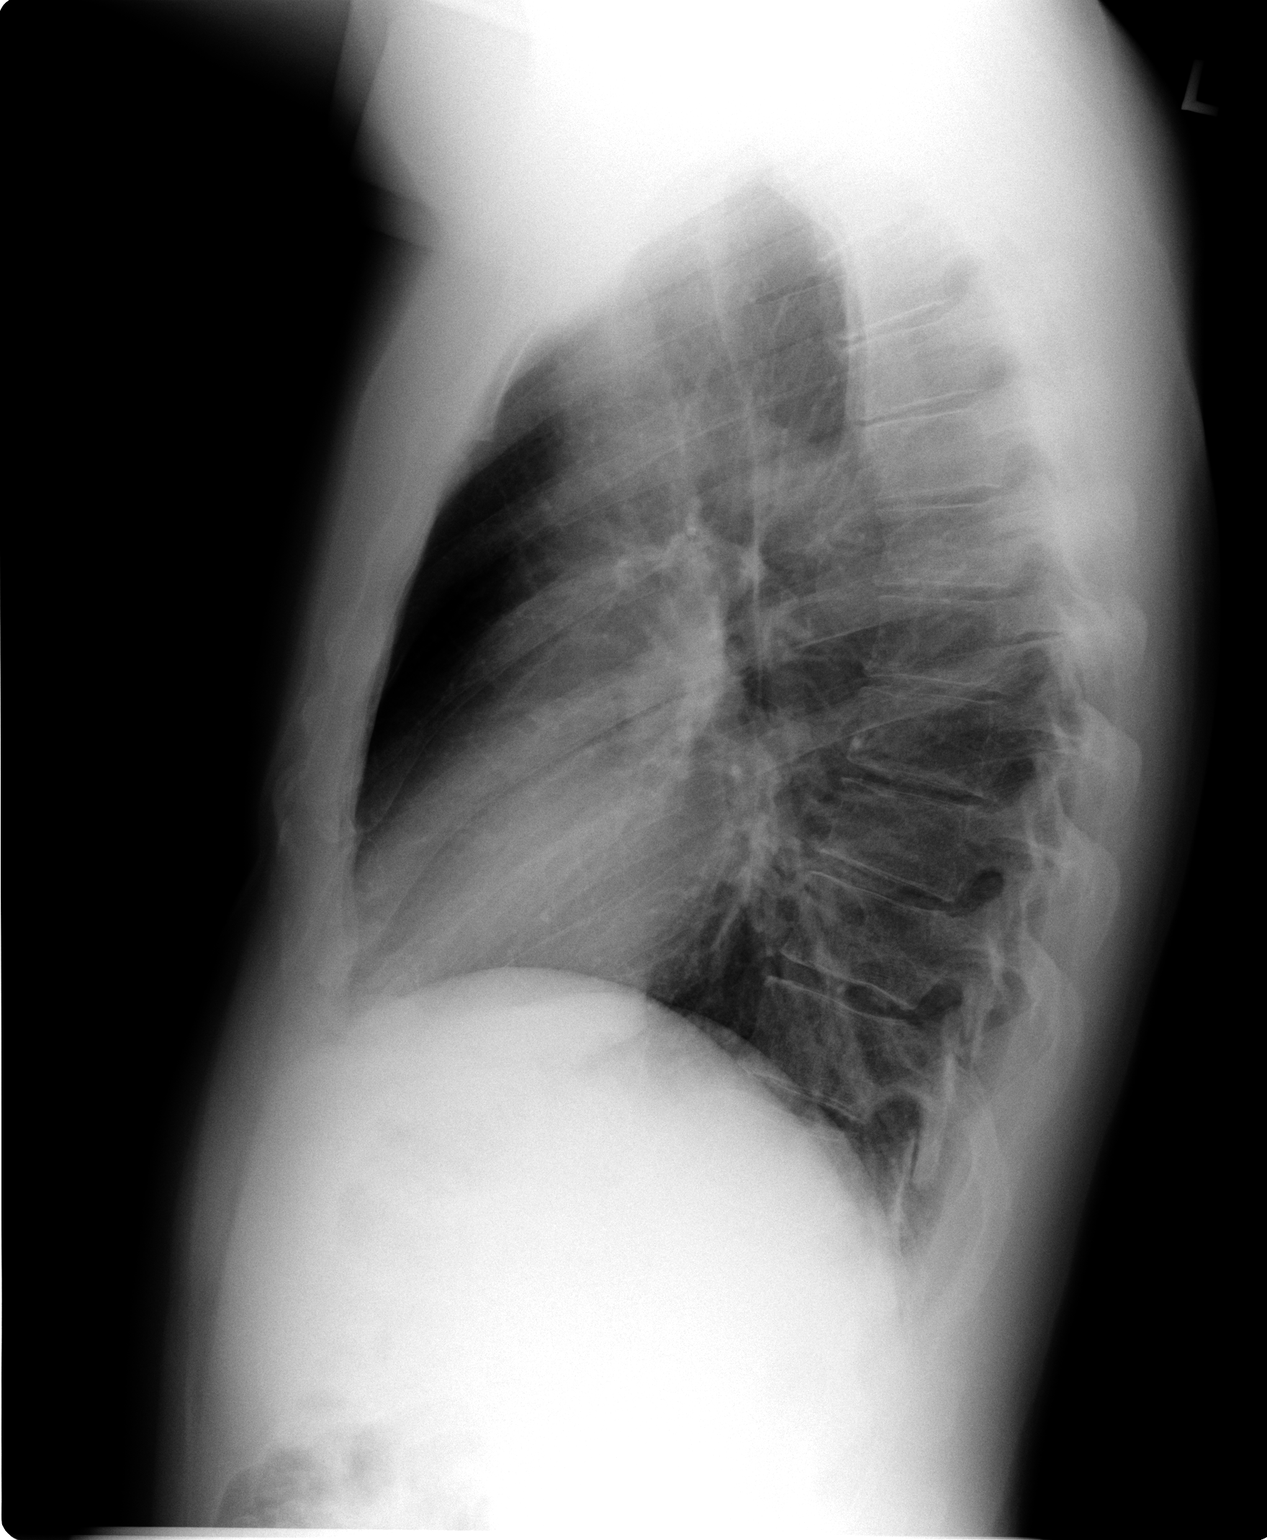

[2 of 2 positions shown; findings below may reference images not displayed]

FINDINGS: Heart size and mediastinal contours are normal. Both lungs are clear. There is no evidence of pleural effusion. No mass or adenopathy identified. No significant change is seen compared with prior study.
IMPRESSION: Stable chest. No active disease.

## 2009-01-29 ENCOUNTER — Encounter: Payer: Self-pay | Admitting: Internal Medicine

## 2009-02-04 ENCOUNTER — Ambulatory Visit: Payer: Self-pay | Admitting: Internal Medicine

## 2009-02-04 DIAGNOSIS — E782 Mixed hyperlipidemia: Secondary | ICD-10-CM

## 2009-02-04 DIAGNOSIS — D649 Anemia, unspecified: Secondary | ICD-10-CM | POA: Insufficient documentation

## 2009-02-04 DIAGNOSIS — E559 Vitamin D deficiency, unspecified: Secondary | ICD-10-CM

## 2009-02-11 ENCOUNTER — Ambulatory Visit: Payer: Self-pay | Admitting: Internal Medicine

## 2009-02-11 ENCOUNTER — Telehealth: Payer: Self-pay | Admitting: Internal Medicine

## 2009-02-11 ENCOUNTER — Ambulatory Visit (HOSPITAL_COMMUNITY): Admission: RE | Admit: 2009-02-11 | Discharge: 2009-02-11 | Payer: Self-pay | Admitting: Internal Medicine

## 2009-02-11 ENCOUNTER — Encounter: Payer: Self-pay | Admitting: Internal Medicine

## 2009-02-12 ENCOUNTER — Encounter: Payer: Self-pay | Admitting: Internal Medicine

## 2009-03-04 ENCOUNTER — Encounter: Payer: Self-pay | Admitting: Internal Medicine

## 2009-03-12 ENCOUNTER — Ambulatory Visit: Payer: Self-pay | Admitting: Internal Medicine

## 2009-03-12 DIAGNOSIS — K5150 Left sided colitis without complications: Secondary | ICD-10-CM

## 2012-04-13 ENCOUNTER — Telehealth: Payer: Self-pay | Admitting: Internal Medicine

## 2012-04-13 NOTE — Telephone Encounter (Signed)
Scheduled patient on 04/14/12 at 2:00 PM with Mike Gip, PA .Spoke with Aram Beecham and patient is out of town. Rescheduled him on 04/19/12 at 10:30 AM with Willette Cluster, NP. Aram Beecham to fax over records on patient.

## 2012-04-14 ENCOUNTER — Ambulatory Visit: Payer: Self-pay | Admitting: Physician Assistant

## 2012-04-18 ENCOUNTER — Encounter: Payer: Self-pay | Admitting: *Deleted

## 2012-04-19 ENCOUNTER — Encounter: Payer: Self-pay | Admitting: Nurse Practitioner

## 2012-04-19 ENCOUNTER — Ambulatory Visit (INDEPENDENT_AMBULATORY_CARE_PROVIDER_SITE_OTHER): Payer: BC Managed Care – PPO | Admitting: Nurse Practitioner

## 2012-04-19 ENCOUNTER — Encounter: Payer: Self-pay | Admitting: Internal Medicine

## 2012-04-19 VITALS — BP 104/64 | HR 72 | Ht 68.5 in | Wt 166.6 lb

## 2012-04-19 DIAGNOSIS — K649 Unspecified hemorrhoids: Secondary | ICD-10-CM

## 2012-04-19 DIAGNOSIS — K5150 Left sided colitis without complications: Secondary | ICD-10-CM

## 2012-04-19 DIAGNOSIS — K625 Hemorrhage of anus and rectum: Secondary | ICD-10-CM | POA: Insufficient documentation

## 2012-04-19 MED ORDER — MOVIPREP 100 G PO SOLR: 1.0000 | Freq: Once | ORAL | Status: AC

## 2012-04-19 MED ORDER — HYDROCORTISONE ACETATE 25 MG RE SUPP: RECTAL | Status: DC

## 2012-04-19 NOTE — Progress Notes (Signed)
04/19/2012 Jeffrey Gallagher [id number] Aug 06, 1955   HISTORY OF PRESENT ILLNESS: Patient is a 56 year old male diagnosed by Dr. Juanda Chance with left-sided colitis in 2010 after having upper endoscopy and colonoscopy for evaluation of iron deficiency anemia and rectal bleeding. His upper endoscopy was normal. Colonoscopy showed sigmoid colitis. Terminal ileum and rectum were normal. 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 Patient had resolution of symptoms with Lialda. He has not been back to the office in 3 years. Patient took the Lialda but didn't realize he needed to get refills.  He has actually done quite well from a gastrointestinal standpoint.  Patient now referred for evaluation of Hemoccult-positive stool. He does see a small amount of blood in his stool from time to time. No bowel changes except for occasional diarrhea. He has occasional lower abdominal tenderness. No abdominal pain. No joint pain. Labs from [facility] office September 2013 show normal hemoglobin at 14.4, normal MCV. Normal liver function studies.   Past Medical History  Diagnosis Date  . Anemia   . Hyperlipidemia   . Ulcerative colitis, left sided    History reviewed. No pertinent past surgical history.  reports that he quit smoking about 9 years ago. He has never used smokeless tobacco. He reports that he drinks alcohol. He reports that he does not use illicit drugs. family history includes Coronary artery disease in his father and Lung cancer in his mother.  There is no history of Colon cancer. Not on File    Outpatient Encounter Prescriptions as of 04/19/2012  Medication Sig Dispense Refill  . Ergocalciferol (VITAMIN D2) 2000 UNITS TABS Take by mouth. Take 3 tablets daily      . Multiple Vitamins-Minerals (MULTIVITAMIN PO) Take by mouth. Take 1 tab daily      . Omega-3 Fatty Acids (FISH OIL) 1000 MG CAPS Take 2 capsules by mouth daily.         REVIEW OF SYSTEMS  : All other systems reviewed  and negative except where noted in the History of Present Illness.   PHYSICAL EXAM: BP 104/64  Pulse 72  Ht 5' 8.5" (1.74 m)  Wt 166 lb 9.6 oz (75.569 kg)  BMI 24.96 kg/m2 General: Well developed white male in no acute distress Head: Normocephalic and atraumatic Eyes:  sclerae anicteric,conjunctive pink. Ears: Normal auditory acuity Neck: Supple, no masses.  Lungs: Clear throughout to auscultation Heart: Regular rate and rhythm Abdomen: Soft, nontender, non distended. No masses or hepatomegaly noted. Normal bowel sounds Rectal: No external lesions. Soft, light brown stool. On anoscopy there is a large internal hemorrhoid.  Musculoskeletal: Symmetrical with no gross deformities  Skin: No lesions on visible extremities Extremities: No edema  Neurological: Alert oriented x 4, grossly nonfocal Cervical Nodes:  No significant cervical adenopathy Psychological:  Alert and cooperative. Normal mood and affect  ASSESSMENT AND PLAN:  1. History of left-sided colitis in 2010. Patient has been off Mesalamine for at least two years. Except for occasional small volume rectum bleeding he has done okay from a GI standpoint Patient needs a colonoscopy to reevaluate his left-sided colitis. The risks, benefits, and alternatives to colonoscopy with possible biopsy and possible polypectomy were discussed with the patient and they consent to proceed.   2. Internal hemorrhoids, this could be the cause of, or at least contributing to rectal bleeding. Will treat with 7 days of Anusol-HC suppositories. Avoid straining/constipation.

## 2012-04-19 NOTE — Patient Instructions (Addendum)
We have scheduled the Colonoscopy with Dr. Lina Sar on 04-29-2012. We also sent a prescription for the Moviprep to [facility] , [address] [place], [place], [place]. We have provided ou a $20 rebate coupon.  Fill out your name and address on the back and mail the receipt from [facility] in and you will receive $20.00. Directions and brochure provided.

## 2012-04-20 ENCOUNTER — Encounter: Payer: Self-pay | Admitting: Nurse Practitioner

## 2012-04-20 NOTE — Progress Notes (Signed)
Please ask pt to pick up samples of Mesalamine  To take Asacal HD 800 mg, 2 po bid till  he colonoscopy,#36

## 2012-04-21 ENCOUNTER — Telehealth: Payer: Self-pay | Admitting: *Deleted

## 2012-04-21 MED ORDER — MESALAMINE 800 MG PO TBEC: DELAYED_RELEASE_TABLET | ORAL | Status: DC

## 2012-04-21 NOTE — Telephone Encounter (Signed)
Spoke with patient and samples up front for pick up. 

## 2012-04-21 NOTE — Telephone Encounter (Signed)
Message copied by Daphine Deutscher on Thu Apr 21, 2012  1:46 PM ------      Message from: Meredith Pel      Created: Thu Apr 21, 2012 11:57 AM       Rene Kocher, will you see if we have samples of mesalamine and ask patient to come by and get some. Please see brodie's comment to my office note. thanks      ----- Message -----         From: Hart Carwin, MD         Sent: 04/20/2012  10:46 PM           To: Meredith Pel, NP

## 2012-04-29 ENCOUNTER — Ambulatory Visit (AMBULATORY_SURGERY_CENTER): Payer: BC Managed Care – PPO | Admitting: Internal Medicine

## 2012-04-29 ENCOUNTER — Encounter: Payer: Self-pay | Admitting: Internal Medicine

## 2012-04-29 VITALS — BP 125/79 | HR 57 | Temp 98.3°F | Resp 15

## 2012-04-29 DIAGNOSIS — K649 Unspecified hemorrhoids: Secondary | ICD-10-CM

## 2012-04-29 DIAGNOSIS — K5150 Left sided colitis without complications: Secondary | ICD-10-CM

## 2012-04-29 DIAGNOSIS — D126 Benign neoplasm of colon, unspecified: Secondary | ICD-10-CM

## 2012-04-29 DIAGNOSIS — K5289 Other specified noninfective gastroenteritis and colitis: Secondary | ICD-10-CM

## 2012-04-29 DIAGNOSIS — K625 Hemorrhage of anus and rectum: Secondary | ICD-10-CM

## 2012-04-29 MED ORDER — SODIUM CHLORIDE 0.9 % IV SOLN: 500.0000 mL | INTRAVENOUS | Status: DC

## 2012-04-29 MED ORDER — HYDROCORTISONE ACETATE 25 MG RE SUPP: 25.0000 mg | Freq: Every evening | RECTAL | Status: DC | PRN

## 2012-04-29 NOTE — Op Note (Signed)
[facility] [address] [place], [address]   COLONOSCOPY PROCEDURE REPORT  PATIENT: Jeffrey, Gallagher  MR#: [id number] BIRTHDATE: [birth year] , 56  yrs. old GENDER: Male ENDOSCOPIST: Hart Carwin, MD REFERRED BY:  [name], M.D. PROCEDURE DATE:  04/29/2012 PROCEDURE:   Colonoscopy with biopsy ASA CLASS:   Class II INDICATIONS:hematochezia, hx of segmental left sided colitis on colon 02/2009, prior cplom 2006- hyperplastic polyp, pt remains asymptomatic. MEDICATIONS: MAC sedation, administered by CRNA and Propofol (Diprivan) 230 mg IV  DESCRIPTION OF PROCEDURE:   After the risks and benefits and of the procedure were explained, informed consent was obtained.  A digital rectal exam revealed no abnormalities of the rectum.    The LB [id number]  endoscope was introduced through the anus and advanced to the cecum, which was identified by both the appendix and ileocecal valve .  The quality of the prep was good, using MoviPrep .  The instrument was then slowly withdrawn as the colon was fully examined.     COLON FINDINGS: Small internal hemorrhoids were found. No evidence of colitis, biopsies taken from the right colon, sigmoid colon 30-50cm, and the rectum,there was no edema or friability Retroflexed views revealed no abnormalities.     The scope was then withdrawn from the patient and the procedure completed.  COMPLICATIONS: There were no complications. ENDOSCOPIC IMPRESSION: Small internal hemorrhoids  RECOMMENDATIONS: 1.  await biopsy results 2.  High fiber diet 3  .Anusol HC supp 1 hs   REPEAT EXAM:   .  10 years depending on biopsy results  cc:  _______________________________ eSignedHart Carwin, MD 04/29/2012 12:42 PM

## 2012-04-29 NOTE — Patient Instructions (Addendum)

## 2012-04-29 NOTE — Progress Notes (Signed)
Patient did not experience any of the following events: a burn prior to discharge; a fall within the facility; wrong site/side/patient/procedure/implant event; or a hospital transfer or hospital admission upon discharge from the facility. (G8907) Patient did not have preoperative order for IV antibiotic SSI prophylaxis. (G8918) Patient did not have preoperative order for IV antibiotic SSI prophylaxis. (G8918)  

## 2012-05-02 ENCOUNTER — Telehealth: Payer: Self-pay

## 2012-05-02 NOTE — Telephone Encounter (Signed)
  Follow up Call-  Call back number 04/29/2012  Post procedure Call Back phone  # [phone]  Permission to leave phone message Yes     Patient questions:  Do you have a fever, pain , or abdominal swelling? no Pain Score  0 *  Have you tolerated food without any problems? yes  Have you been able to return to your normal activities? yes  Do you have any questions about your discharge instructions: Diet   no Medications  no Follow up visit  no  Do you have questions or concerns about your Care? no  Actions: * If pain score is 4 or above: No action needed, pain <4.

## 2012-05-03 ENCOUNTER — Encounter: Payer: Self-pay | Admitting: Internal Medicine

## 2012-05-04 ENCOUNTER — Other Ambulatory Visit: Payer: Self-pay | Admitting: *Deleted

## 2012-05-04 MED ORDER — SULFASALAZINE 500 MG PO TABS: ORAL_TABLET | ORAL | Status: DC

## 2012-06-06 ENCOUNTER — Ambulatory Visit (HOSPITAL_COMMUNITY)
Admission: RE | Admit: 2012-06-06 | Discharge: 2012-06-06 | Disposition: A | Discharge status: Home / Self Care | Payer: BC Managed Care – PPO | Source: Ambulatory Visit | Attending: Internal Medicine | Admitting: Internal Medicine

## 2012-06-06 ENCOUNTER — Other Ambulatory Visit (HOSPITAL_COMMUNITY): Payer: Self-pay | Admitting: Internal Medicine

## 2012-06-06 DIAGNOSIS — R071 Chest pain on breathing: Secondary | ICD-10-CM

## 2012-06-06 DIAGNOSIS — R0789 Other chest pain: Secondary | ICD-10-CM | POA: Insufficient documentation

## 2012-06-06 DIAGNOSIS — M542 Cervicalgia: Secondary | ICD-10-CM

## 2012-06-06 IMAGING — CR DG CERVICAL SPINE COMPLETE 4+V
5 series · 5 of 5 positions shown · non-contrast
Comparison: None.

CLINICAL DATA: Right neck pain

CERVICAL SPINE - COMPLETE 4+ VIEW

[view not recorded (1 of 5)]
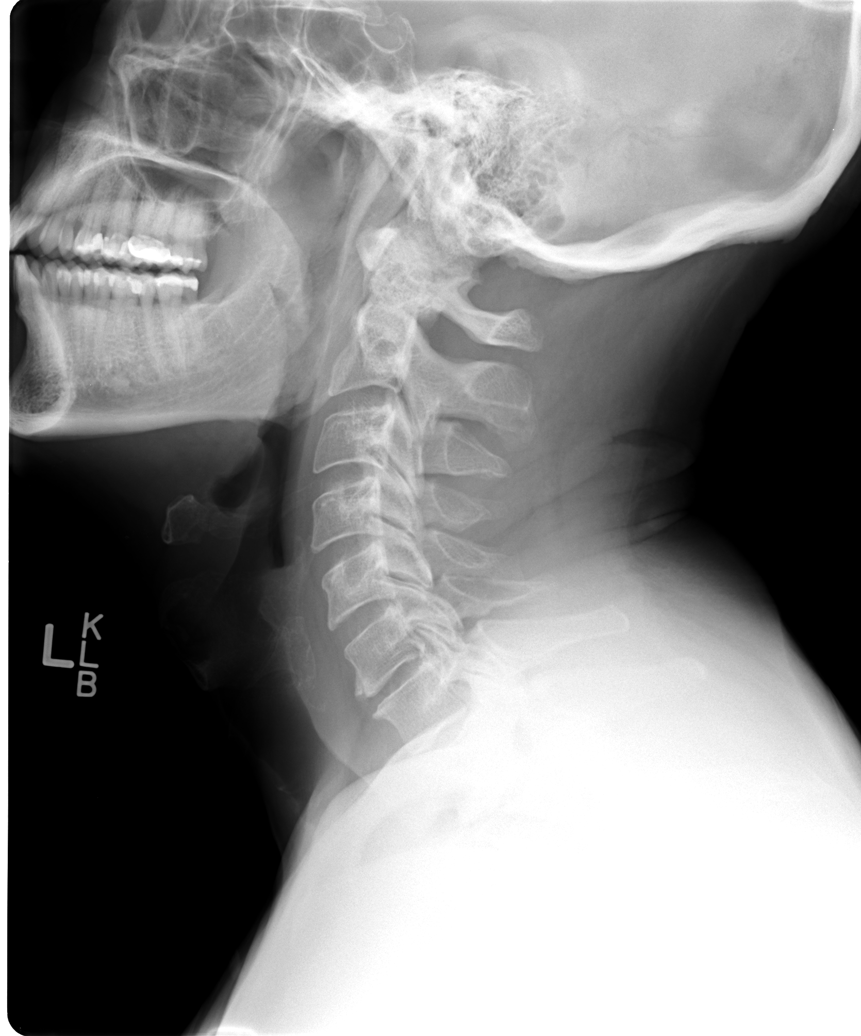

[view not recorded (2 of 5)]
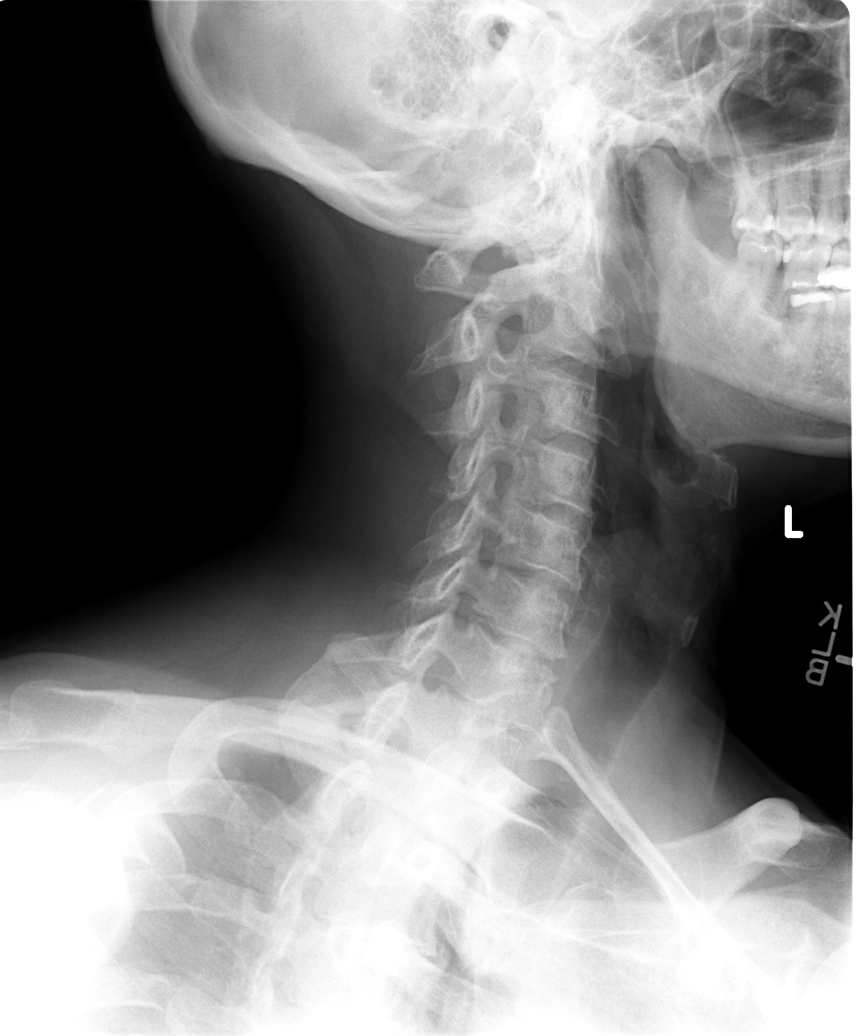

[view not recorded (3 of 5)]
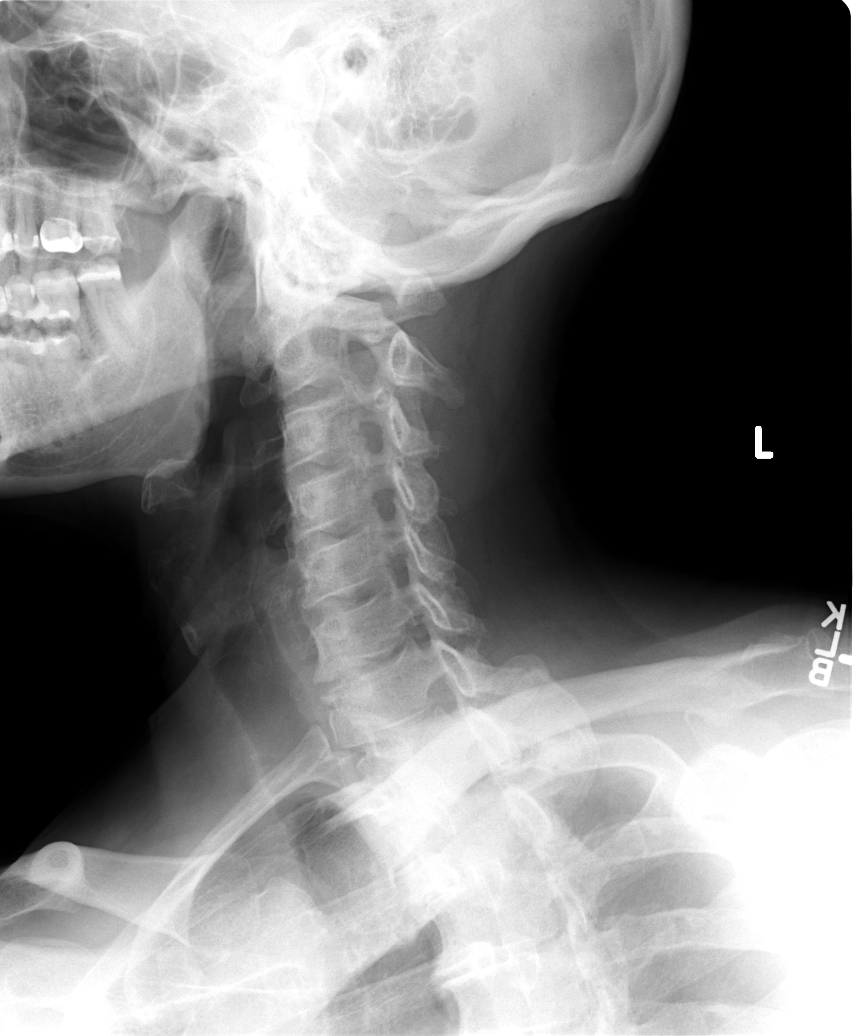

[view not recorded (4 of 5)]
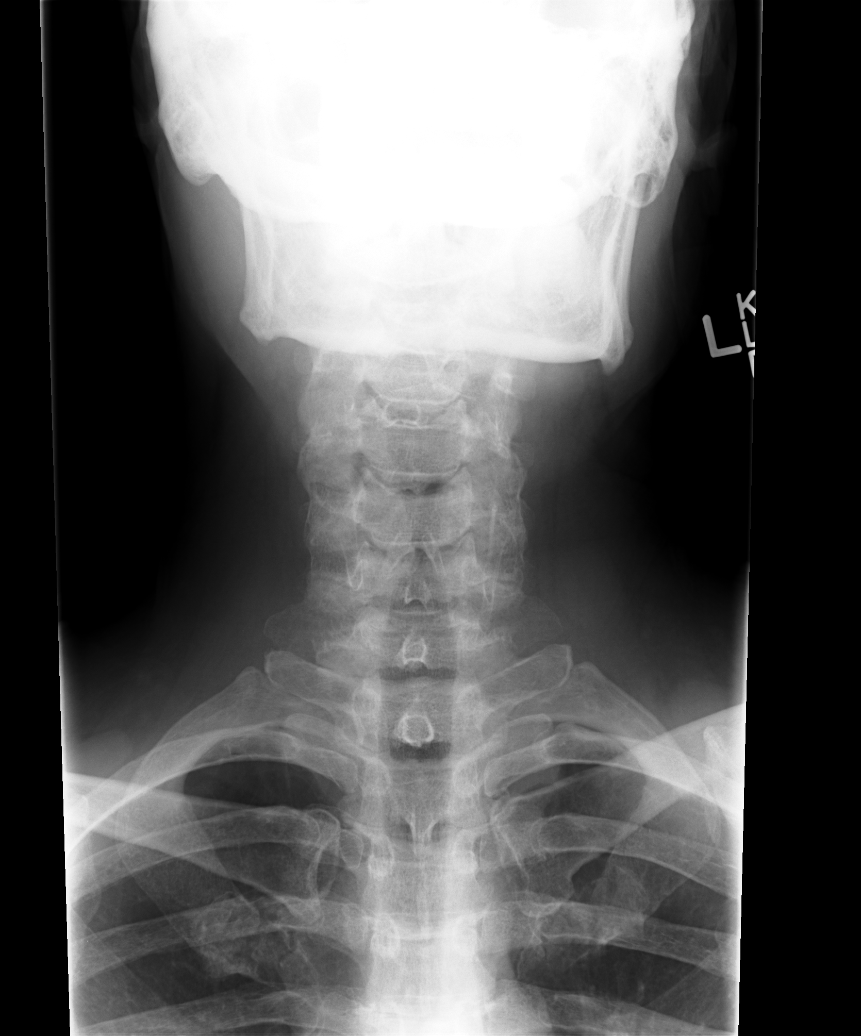

[view not recorded (5 of 5)]
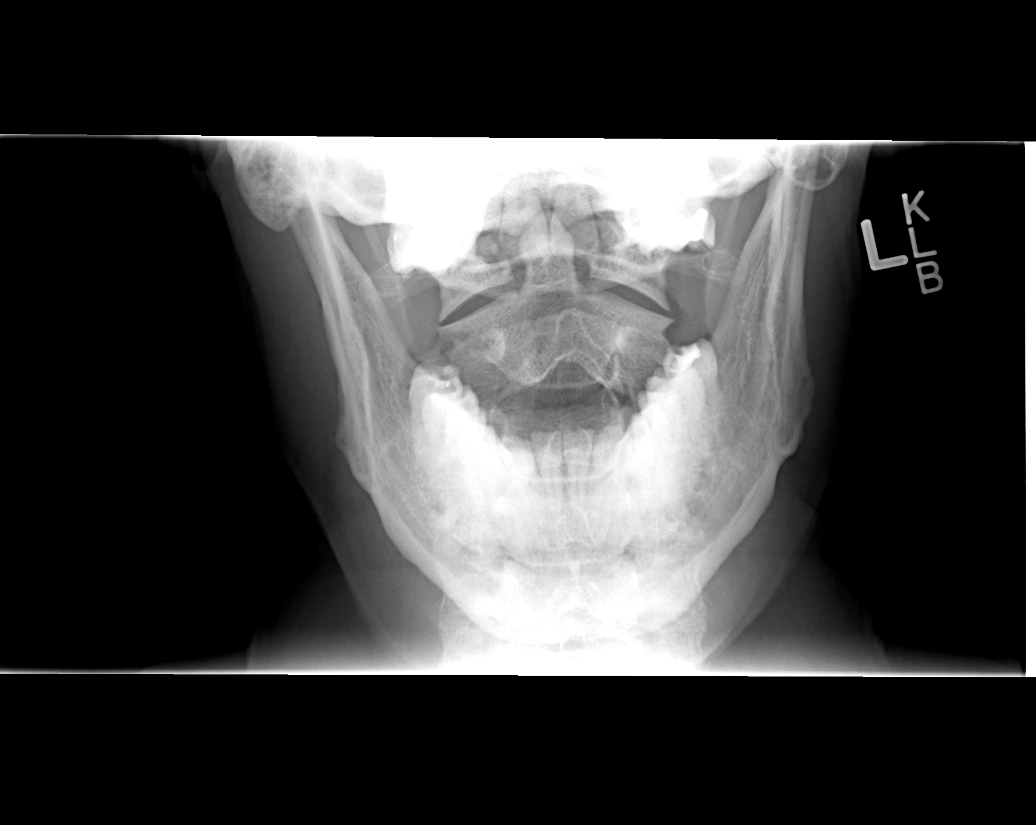

[5 of 5 positions shown; findings below may reference images not displayed]

FINDINGS: Cervical spine visualize to C7-T1 on the lateral view.

Exaggerated cervical lordosis.

No evidence of fracture or dislocation.  The vertebral body heights
are maintained.  The dens appears intact.  Lateral masses of C1 are
symmetric.

No prevertebral soft tissue swelling.

Mild degenerative changes at C5-6.

Bilateral neural foramina are essentially patent.

Visualized lung apices are clear.
IMPRESSION: No fracture or dislocation is seen.

Mild degenerative changes at C5-6.

## 2012-06-06 IMAGING — CR DG CHEST 2V
3 series · 3 of 3 positions shown · non-contrast
Comparison: [DATE].

CLINICAL DATA: Chest wall pain.

CHEST - 2 VIEW

[view not recorded (1 of 3)]
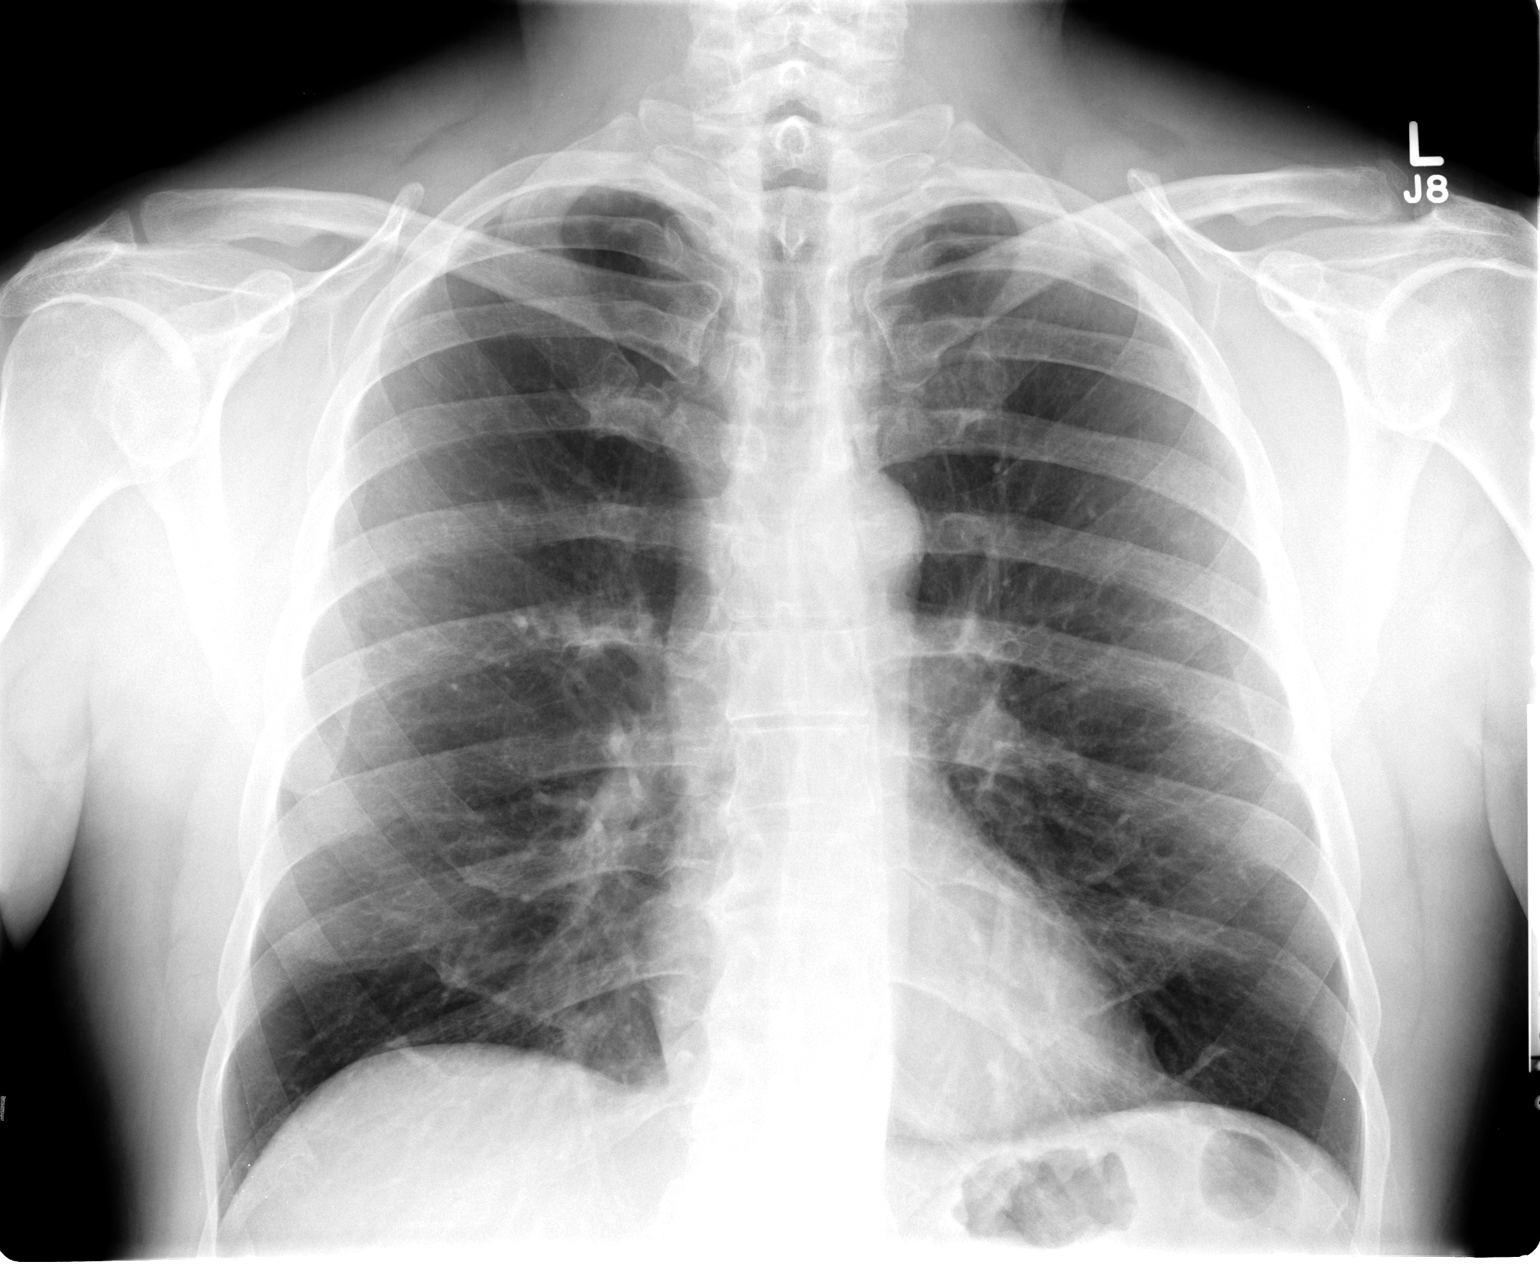

[view not recorded (2 of 3)]
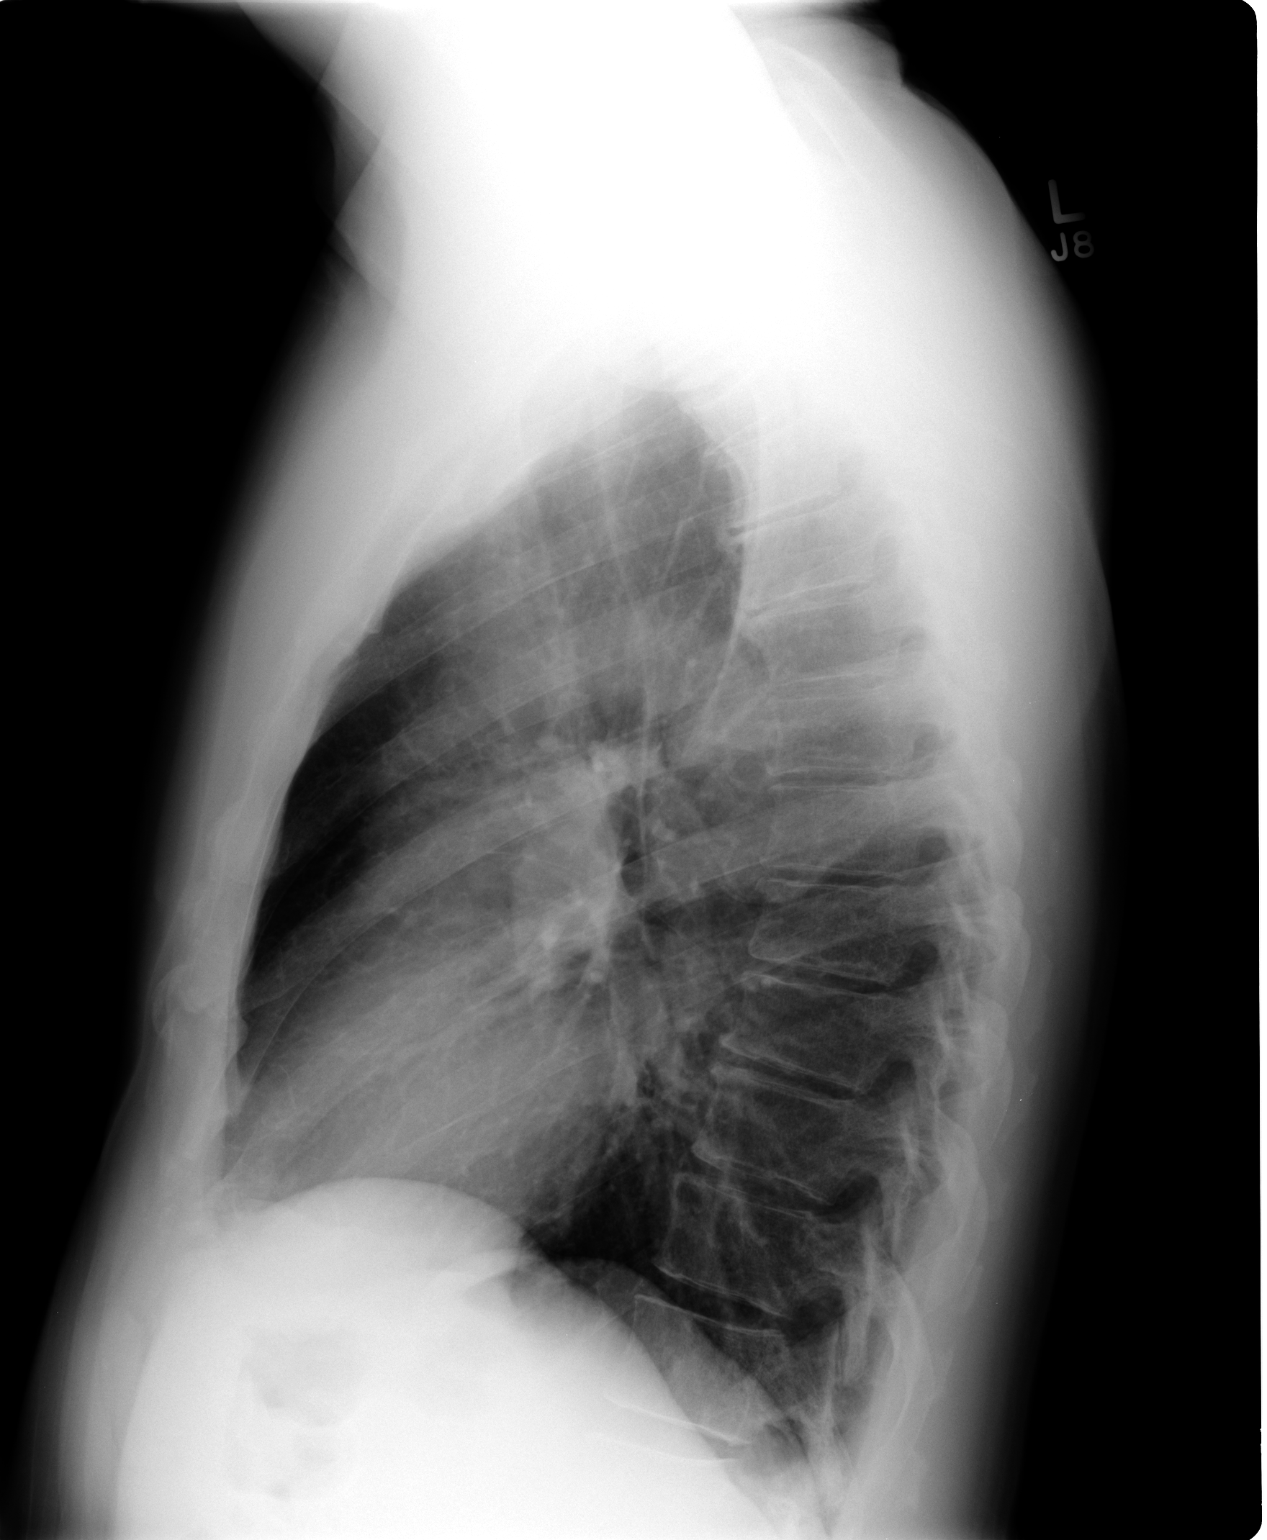

[view not recorded (3 of 3)]
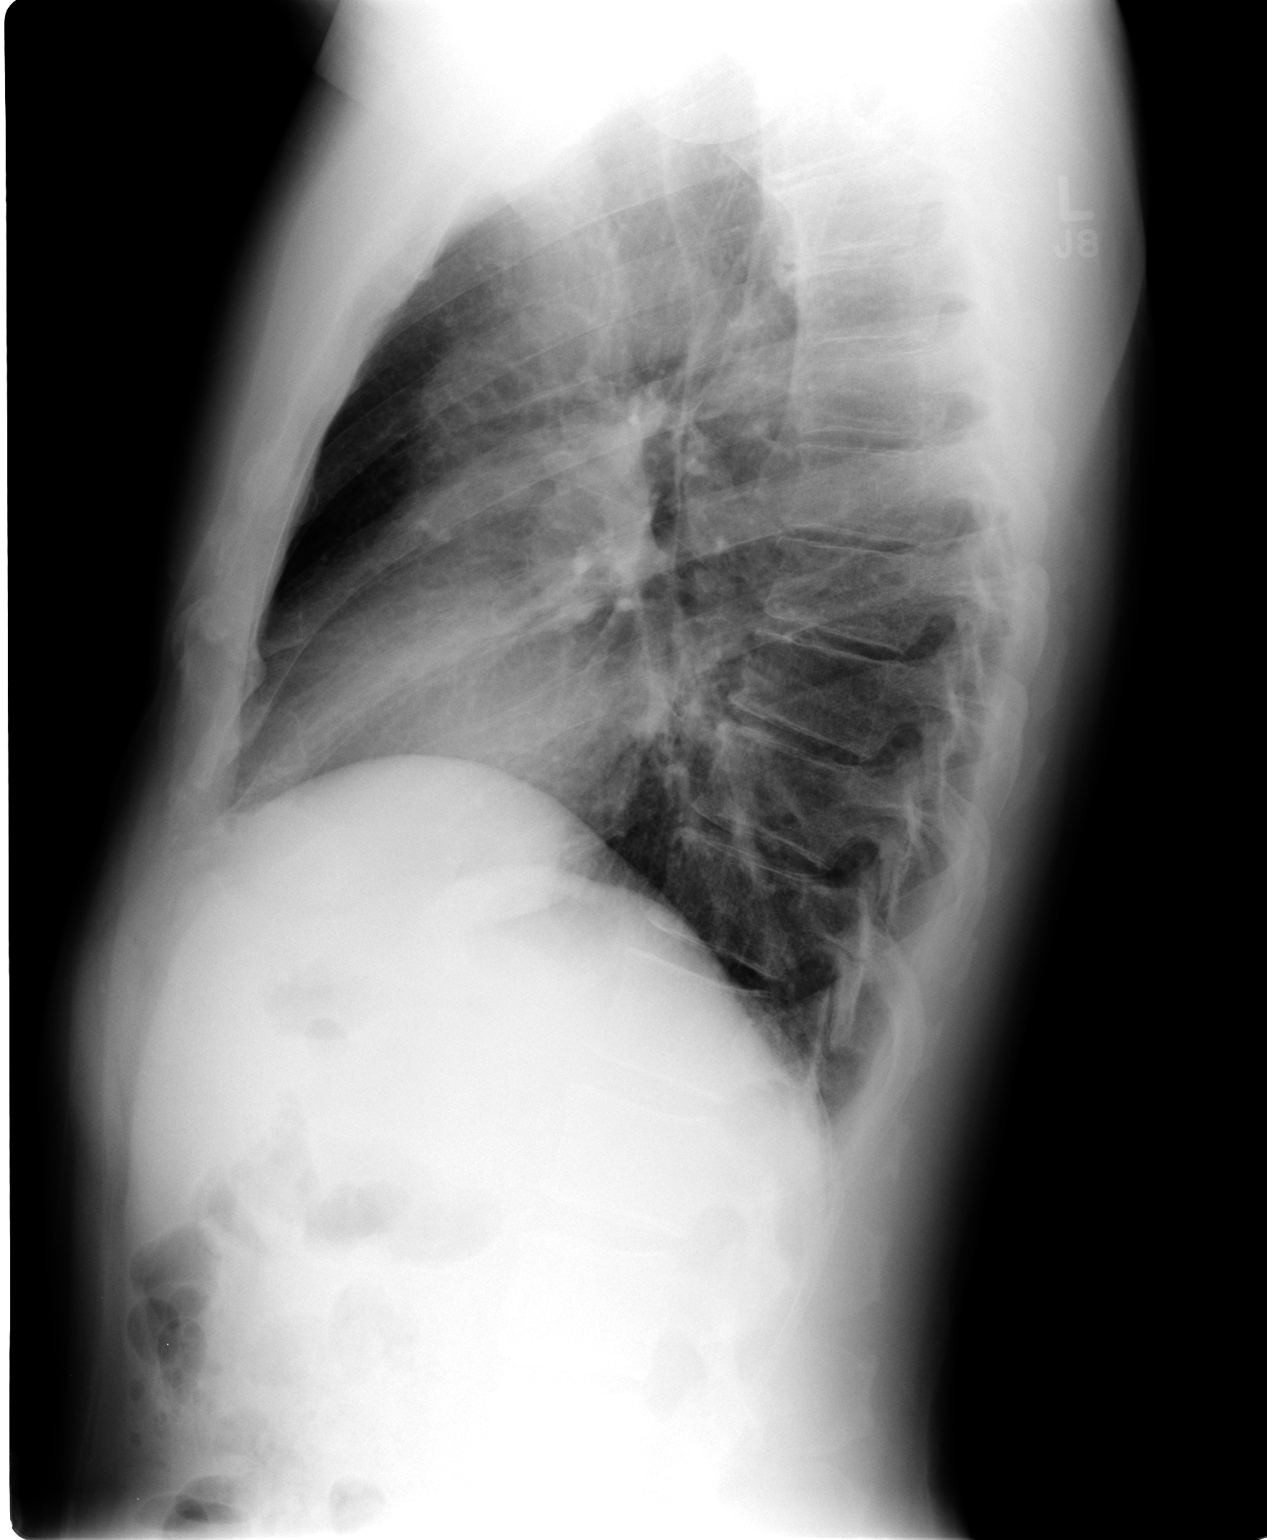

[3 of 3 positions shown; findings below may reference images not displayed]

FINDINGS: Cardiomediastinal silhouette appears normal.  No acute
pulmonary disease is noted.  Bony thorax is intact.
IMPRESSION: No acute cardiopulmonary abnormality seen.

## 2013-05-03 ENCOUNTER — Other Ambulatory Visit: Payer: Self-pay | Admitting: Dermatology

## 2014-03-14 ENCOUNTER — Telehealth: Payer: Self-pay | Admitting: Internal Medicine

## 2014-03-14 NOTE — Telephone Encounter (Signed)
Please put him on the wait list and we can call him if anybody cancels. thanx

## 2014-03-14 NOTE — Telephone Encounter (Signed)
Spoke with patient and he states he has been having constipation and bright, red blood in stool for 1 month now. States work is stressful also. He reports he is not drinking enough water and he get constipated. Offered OV with extender but he prefers to see Dr. Olevia Perches. Scheduled on next available on 03/27/14 at 3:00 PM.

## 2014-03-15 ENCOUNTER — Encounter: Payer: Self-pay | Admitting: *Deleted

## 2014-03-27 ENCOUNTER — Ambulatory Visit (INDEPENDENT_AMBULATORY_CARE_PROVIDER_SITE_OTHER): Payer: BC Managed Care – PPO | Admitting: Internal Medicine

## 2014-03-27 ENCOUNTER — Other Ambulatory Visit (INDEPENDENT_AMBULATORY_CARE_PROVIDER_SITE_OTHER): Payer: Self-pay

## 2014-03-27 ENCOUNTER — Encounter: Payer: Self-pay | Admitting: Internal Medicine

## 2014-03-27 VITALS — BP 110/76 | HR 72 | Ht 67.75 in | Wt 165.4 lb

## 2014-03-27 DIAGNOSIS — K5190 Ulcerative colitis, unspecified, without complications: Secondary | ICD-10-CM

## 2014-03-27 DIAGNOSIS — R195 Other fecal abnormalities: Secondary | ICD-10-CM

## 2014-03-27 LAB — COMPREHENSIVE METABOLIC PANEL
ALBUMIN: 3.9 g/dL (ref 3.5–5.2)
ALT: 23 U/L (ref 0–53)
AST: 20 U/L (ref 0–37)
Alkaline Phosphatase: 72 U/L (ref 39–117)
BUN: 19 mg/dL (ref 6–23)
CALCIUM: 9 mg/dL (ref 8.4–10.5)
CHLORIDE: 103 meq/L (ref 96–112)
CO2: 26 meq/L (ref 19–32)
Creatinine, Ser: 1.1 mg/dL (ref 0.4–1.5)
GFR: 75.53 mL/min (ref >60.00)
Glucose, Bld: 92 mg/dL (ref 70–99)
POTASSIUM: 3.8 meq/L (ref 3.5–5.1)
SODIUM: 137 meq/L (ref 135–145)
TOTAL PROTEIN: 7 g/dL (ref 6.0–8.3)
Total Bilirubin: 0.6 mg/dL (ref 0.2–1.2)

## 2014-03-27 LAB — IBC PANEL
Iron: 60 ug/dL (ref 42–165)
SATURATION RATIOS: 16.8 % — AB (ref 20.0–50.0)
Transferrin: 255.7 mg/dL (ref 212.0–360.0)

## 2014-03-27 LAB — SEDIMENTATION RATE: SED RATE: 11 mm/h (ref 0–22)

## 2014-03-27 MED ORDER — SULFASALAZINE 500 MG PO TABS: 500.0000 mg | ORAL_TABLET | Freq: Four times a day (QID) | ORAL | Status: DC

## 2014-03-27 MED ORDER — FOLIC ACID 1 MG PO TABS: 1.0000 mg | ORAL_TABLET | Freq: Every day | ORAL | Status: DC

## 2014-03-27 NOTE — Progress Notes (Signed)
Jeffrey Gallagher [facility] [birth year] [id number]  Note: This dictation was prepared with Dragon digital system. Any transcriptional errors that result from this procedure are unintentional.   History of Present Illness:  This is a 58 year old white male with a history of ulcerative colitis involving the left colon on colonoscopy in August 2010 and subsequently in October 2013 when he had colitis between 30-50 cm from the rectum. Biopsies showed crypt abscesses and inflammatory changes consistent with inflammatory bowel disease. He had a hyperplastic polyp on colonoscopy in 2006. He was on sulfasalazine 1 g twice a day following colonoscopy but has run out of it and has not been on any medications. For past 2 years until recently when he found some old Sulfasalazine at home and took them over period of several weeks, resulting in disappearance of the blood.Marland Kitchen His weight has been stable. There has been no fever or abdominal pain. He says that he has been constipated but at the same time, that his  that his stools are  runny. He feels very bloated. He has no insurance.so our diagnostic workup needs to be cost effective.    Past Medical History  Diagnosis Date  . Anemia   . Hyperlipidemia   . Ulcerative colitis, left sided   . Vitamin D deficiency   . Internal hemorrhoids     History reviewed. No pertinent past surgical history.  No Known Allergies  Family history and social history have been reviewed.  Review of Systems:   The remainder of the 10 point ROS is negative except as outlined in the H&P  Physical Exam: General Appearance Well developed, in no distress Eyes  Non icteric  HEENT  Non traumatic, normocephalic  Mouth No lesion, tongue papillated, no cheilosis Neck Supple without adenopathy, thyroid not enlarged, no carotid bruits, no JVD Lungs Clear to auscultation bilaterally COR Normal S1, normal S2, regular rhythm, no murmur, quiet precordium Abdomen soft mildly tender in left lower and  mild middle quadrant. Normoactive bowel sounds. Voluntary guarding. Liver edge at costal margin  Rectal small amount of heme positive stool. No gross blood Extremities  No pedal edema Skin No lesions Neurological Alert and oriented x 3 Psychological Normal mood and affect  Assessment and Plan:   Problem #15 58 year old white male with inflammatory bowel disease involving the left colon. He had a recent exacerbation while  not taking his medications. We will put him back on sulfasalazine 1 g twice a day and folic acid 1 mg daily and check his blood count and iron studies as well as sedimentation rate. He is up-to-date on the colonoscopy. He needs to call us in 6-8 weeks to give [place] an update and if he is not better we will start a steroid taper. He may also  need iron supplements depending on his blood count today.    Delfin Edis 03/27/2014

## 2014-03-27 NOTE — Patient Instructions (Signed)
Your physician has requested that you go to the basement for the following lab work before leaving today: Sed Rate, CBC, IBC, CMET  We have sent the following medications to your pharmacy for you to pick up at your convenience: azulfadine Folic acid  Please purchase the following medications over the counter and take as directed: Milk of Magnesia as needed for constipation  CC:Dr Melford Aase

## 2014-03-28 LAB — CBC WITH DIFFERENTIAL/PLATELET
BASOS ABS: 0 10*3/uL (ref 0.0–0.1)
Basophils Relative: 0.4 % (ref 0.0–3.0)
EOS ABS: 0.3 10*3/uL (ref 0.0–0.7)
Eosinophils Relative: 4.1 % (ref 0.0–5.0)
HCT: 39.3 % (ref 39.0–52.0)
HEMOGLOBIN: 13.5 g/dL (ref 13.0–17.0)
LYMPHS PCT: 25.4 % (ref 12.0–46.0)
Lymphs Abs: 1.7 10*3/uL (ref 0.7–4.0)
MCHC: 34.3 g/dL (ref 30.0–36.0)
MCV: 93.2 fl (ref 78.0–100.0)
MONOS PCT: 8.1 % (ref 3.0–12.0)
Monocytes Absolute: 0.5 10*3/uL (ref 0.1–1.0)
NEUTROS PCT: 62 % (ref 43.0–77.0)
Neutro Abs: 4.1 10*3/uL (ref 1.4–7.7)
Platelets: 323 10*3/uL (ref 150.0–400.0)
RBC: 4.21 Mil/uL — ABNORMAL LOW (ref 4.22–5.81)
RDW: 12.6 % (ref 11.5–15.5)
WBC: 6.5 10*3/uL (ref 4.0–10.5)

## 2014-04-23 ENCOUNTER — Telehealth: Payer: Self-pay | Admitting: Internal Medicine

## 2014-04-23 NOTE — Telephone Encounter (Signed)
Please start Prednisone taper as follows: Prednisone 10mg ,#200, 2 refills, 4 po qd x 1 week,3 po qd x 2 weeks, 21/2 po qd x 2 weeks, 2 po qd x 2 weeks, 11`/2 po qd x 2 weeks, 1 po qd x 2 weeks, 1/2 po qd x 2 weeks,  Then call with an update.

## 2014-04-23 NOTE — Telephone Encounter (Signed)
Patient states he has been on Sulfasalazine one tablet QID for about a month. He is doing better. No pain but still has some blood in stool. Having 3-4 bowel movements/day. Appetite is good. He just wanted to let Dr. Olevia Perches in case he needs to do something else. Please, advise.

## 2014-04-24 MED ORDER — PREDNISONE 10 MG PO TABS: ORAL_TABLET | ORAL | Status: DC

## 2014-04-24 NOTE — Telephone Encounter (Signed)
Patient notified of recommendation. Rx sent to pharmacy. 

## 2014-04-30 ENCOUNTER — Telehealth: Payer: Self-pay | Admitting: Internal Medicine

## 2014-04-30 NOTE — Telephone Encounter (Signed)
Patient just wants to be sure he is to continue Sulfasalzine with Prednisone. Informed him that he is to take both.

## 2014-07-23 ENCOUNTER — Telehealth: Payer: Self-pay | Admitting: Internal Medicine

## 2014-07-23 NOTE — Telephone Encounter (Signed)
Patient calling with update. He is taking Prednisone 5 mg po daily and Sulfasalazine now. He has about 10 tablets left. He is still seeing bright, red blood off and on. Blood is on stools and sometimes in toilet. Please, advise.

## 2014-11-23 ENCOUNTER — Ambulatory Visit (INDEPENDENT_AMBULATORY_CARE_PROVIDER_SITE_OTHER): Payer: Self-pay | Admitting: Internal Medicine

## 2014-11-23 ENCOUNTER — Encounter: Payer: Self-pay | Admitting: Internal Medicine

## 2014-11-23 VITALS — BP 120/78 | HR 72 | Temp 97.7°F | Resp 16 | Ht 69.0 in | Wt 174.0 lb

## 2014-11-23 DIAGNOSIS — E559 Vitamin D deficiency, unspecified: Secondary | ICD-10-CM

## 2014-11-23 DIAGNOSIS — IMO0001 Reserved for inherently not codable concepts without codable children: Secondary | ICD-10-CM | POA: Insufficient documentation

## 2014-11-23 DIAGNOSIS — R030 Elevated blood-pressure reading, without diagnosis of hypertension: Secondary | ICD-10-CM

## 2014-11-23 DIAGNOSIS — K5150 Left sided colitis without complications: Secondary | ICD-10-CM

## 2014-11-23 DIAGNOSIS — R972 Elevated prostate specific antigen [PSA]: Secondary | ICD-10-CM

## 2014-11-23 DIAGNOSIS — E782 Mixed hyperlipidemia: Secondary | ICD-10-CM

## 2014-11-23 DIAGNOSIS — Z79899 Other long term (current) drug therapy: Secondary | ICD-10-CM

## 2014-11-23 LAB — CBC WITH DIFFERENTIAL/PLATELET
Basophils Absolute: 0 10*3/uL (ref 0.0–0.1)
Basophils Relative: 0 % (ref 0–1)
EOS ABS: 0.1 10*3/uL (ref 0.0–0.7)
EOS PCT: 2 % (ref 0–5)
HCT: 39.4 % (ref 39.0–52.0)
HEMOGLOBIN: 13.1 g/dL (ref 13.0–17.0)
LYMPHS ABS: 2.3 10*3/uL (ref 0.7–4.0)
Lymphocytes Relative: 33 % (ref 12–46)
MCH: 31.4 pg (ref 26.0–34.0)
MCHC: 33.2 g/dL (ref 30.0–36.0)
MCV: 94.5 fL (ref 78.0–100.0)
MONOS PCT: 12 % (ref 3–12)
MPV: 9.7 fL (ref 8.6–12.4)
Monocytes Absolute: 0.9 10*3/uL (ref 0.1–1.0)
Neutro Abs: 3.8 10*3/uL (ref 1.7–7.7)
Neutrophils Relative %: 53 % (ref 43–77)
PLATELETS: 333 10*3/uL (ref 150–400)
RBC: 4.17 MIL/uL — AB (ref 4.22–5.81)
RDW: 13.5 % (ref 11.5–15.5)
WBC: 7.1 10*3/uL (ref 4.0–10.5)

## 2014-11-23 MED ORDER — MESALAMINE 1.2 G PO TBEC: DELAYED_RELEASE_TABLET | ORAL | Status: DC

## 2014-11-23 NOTE — Patient Instructions (Signed)

## 2014-11-23 NOTE — Progress Notes (Signed)
   Subjective:    Patient ID: Jeffrey Gallagher, male    DOB: Feb 02, 1956, 59 y.o.   MRN: [id number]  HPI Very nice 59 yo DWM presents for check-up having been lost to f/u for 2-3 years for lack of insurance coverage. He does have remote hx/o elevated BP in 2007 and has been monitored expectantly over the years. He also has hx/o mild ulcerative colitis since 2013 and has been followed by Dr Delfin Edis. Also in 2013 he had an elevated PSA and was seen by Dr Risa Grill & repeat PSA returned Nl.   Medication Sig  . Multiple Vitamins-Minerals  Take by mouth. Take 1 tab daily  . sulfaSALAzine (AZULFIDINE) 500 MG tablet Take 1 tablet (500 mg total) by mouth 4 (four) times daily.  Marland Kitchen VITAMIN D 2000 UNITS  Take by mouth. Take 3 tablets daily  . folic acid  1 MG tablet Take 1 tablet (1 mg total) by mouth daily.  . magnesium 30 MG tablet Take 30 mg by mouth daily.   No Known Allergies   Past Medical History  Diagnosis Date  . Anemia   . Hyperlipidemia   . Ulcerative colitis, left sided   . Vitamin D deficiency   . Internal hemorrhoids     Review of Systems  In addition to the HPI above,  No Fever-chills,  No Headache, No changes with Vision or hearing,  No problems swallowing food or Liquids,  No Chest pain or productive Cough or Shortness of Breath,  No Abdominal pain, No Nausea or Vomitting, Bowel movements are regular,  No Blood in stool or Urine,  No dysuria,  No new skin rashes or bruises,  No new joints pains-aches,  No new weakness, tingling, numbness in any extremity,  No recent weight loss,  No polyuria, polydypsia or polyphagia,  No significant Mental Stressors.  A full 10 point Review of Systems was done, except as stated above, all other Review of Systems were negative     Objective:   Physical Exam  BP 120/78 mmHg  Pulse 72  Temp(Src) 97.7 F (36.5 C)  Resp 16  Ht 5\' 9"  (1.753 m)  Wt 174 lb (78.926 kg)  BMI 25.68 kg/m2  HEENT - neg.  Neck - supple. No Br N JVD. Chest -  clear = BS. Cor -  RRR w/o sig mgr. PP nl w/o edema. Abd - soft - benign w/o masses/tenderness & BS nl. MS - FROM w/o deformities. Neuro - WNL w/o focal abn. Skin - clear    Assessment & Plan:   1. ULCERATIVE COLITIS, LEFT SIDED  - given Sx's mesalamine (LIALDA) 1.2 G EC tablet; 1 to 2 tabs daily for colitis  2. Elevated PSA  - PSA  3. Medication management  - CBC with Differential/Platelet  4. Elevated BP   5. Vitamin D deficiency   6. Mixed hyperlipidemia

## 2014-11-24 LAB — PSA: PSA: 1.55 ng/mL (ref <=4.00)

## 2015-12-05 ENCOUNTER — Encounter: Payer: Self-pay | Admitting: Internal Medicine

## 2016-01-02 ENCOUNTER — Encounter: Payer: Self-pay | Admitting: Internal Medicine

## 2016-01-02 ENCOUNTER — Ambulatory Visit (INDEPENDENT_AMBULATORY_CARE_PROVIDER_SITE_OTHER): Payer: Self-pay | Admitting: Internal Medicine

## 2016-01-02 VITALS — BP 112/80 | HR 64 | Temp 97.5°F | Resp 16 | Ht 68.75 in | Wt 180.6 lb

## 2016-01-02 DIAGNOSIS — R972 Elevated prostate specific antigen [PSA]: Secondary | ICD-10-CM

## 2016-01-02 DIAGNOSIS — E782 Mixed hyperlipidemia: Secondary | ICD-10-CM

## 2016-01-02 DIAGNOSIS — IMO0001 Reserved for inherently not codable concepts without codable children: Secondary | ICD-10-CM

## 2016-01-02 DIAGNOSIS — R030 Elevated blood-pressure reading, without diagnosis of hypertension: Secondary | ICD-10-CM

## 2016-01-02 DIAGNOSIS — E559 Vitamin D deficiency, unspecified: Secondary | ICD-10-CM

## 2016-01-02 DIAGNOSIS — Z79899 Other long term (current) drug therapy: Secondary | ICD-10-CM | POA: Insufficient documentation

## 2016-01-02 LAB — CBC WITH DIFFERENTIAL/PLATELET
BASOS PCT: 0 %
Basophils Absolute: 0 cells/uL (ref 0–200)
EOS PCT: 2 %
Eosinophils Absolute: 108 cells/uL (ref 15–500)
HEMATOCRIT: 39.1 % (ref 38.5–50.0)
HEMOGLOBIN: 13.1 g/dL — AB (ref 13.2–17.1)
LYMPHS ABS: 1944 {cells}/uL (ref 850–3900)
Lymphocytes Relative: 36 %
MCH: 30.8 pg (ref 27.0–33.0)
MCHC: 33.5 g/dL (ref 32.0–36.0)
MCV: 91.8 fL (ref 80.0–100.0)
MONO ABS: 486 {cells}/uL (ref 200–950)
MPV: 9.4 fL (ref 7.5–12.5)
Monocytes Relative: 9 %
NEUTROS ABS: 2862 {cells}/uL (ref 1500–7800)
NEUTROS PCT: 53 %
Platelets: 293 10*3/uL (ref 140–400)
RBC: 4.26 MIL/uL (ref 4.20–5.80)
RDW: 13.7 % (ref 11.0–15.0)
WBC: 5.4 10*3/uL (ref 3.8–10.8)

## 2016-01-02 NOTE — Progress Notes (Signed)
Patient ID: Jeffrey Gallagher, male   DOB: 01/27/1956, 60 y.o.   MRN: [id number]  [facility] ADULT & ADOLESCENT INTERNAL MEDICINE                       Unk Pinto, M.D.        Uvaldo Bristle. Silverio Lay, P.A.-C       Starlyn Skeans, P.A.-C   [facility]                [address] [place], [place] [id number] Telephone [phone] Telefax [phone] ______________________________________________________________________     This very nice 60 y.o. DWM presents for follow up with hx/o elevated BP, Cholesterol, abn. Glucose  and Vitamin D Deficiency. Patient also has hx/o of mild Ulcerative Colitis in the past.      Patient is monitored proactively for labile HTN 1st noted in 2007 & BP has been controlled at home. Today's BP is 112/80 mmHg. Patient has had no complaints of any cardiac type chest pain, palpitations, dyspnea/orthopnea/PND, dizziness, claudication, or dependent edema.     Hyperlipidemia is not controlled with diet. Last Lipids were elevated with T Chol 213, HDL 54, TG 142 and elevated LDL 131 in 2013.     Also, the patient has history of elevated glucose in the past and has had no symptoms of reactive hypoglycemia, diabetic polys, paresthesias or visual blurring.  Last A1c was 5.6% in Sept 2013.     Further, the patient also has history of Vitamin D Deficiency and supplements vitamin D without any suspected side-effects. Last vitamin D was 88 in Sept 2013.      Medication Sig  . VITAMIN D Take 6,000 Int'l Units by mouth daily.  . mesalamine (LIALDA) 1.2 G EC tablet 1 to 2 tabs daily for colitis  . Multiple Vitamins-Minerals  Take by mouth. Take 1 tab daily   NKA PMHx:   Past Medical History  Diagnosis Date  . Anemia   . Hyperlipidemia   . Ulcerative colitis, left sided ([place])   . Vitamin D deficiency   . Internal hemorrhoids    No past surgical history on file.   FHx:    Reviewed / unchanged  SHx:    Reviewed / unchanged   Systems Review:  Constitutional: Denies fever, chills, wt changes, headaches, insomnia, fatigue, night sweats, change in appetite. Eyes: Denies redness, blurred vision, diplopia, discharge, itchy, watery eyes.  ENT: Denies discharge, congestion, post nasal drip, epistaxis, sore throat, earache, hearing loss, dental pain, tinnitus, vertigo, sinus pain, snoring.  CV: Denies chest pain, palpitations, irregular heartbeat, syncope, dyspnea, diaphoresis, orthopnea, PND, claudication or edema. Respiratory: denies cough, dyspnea, DOE, pleurisy, hoarseness, laryngitis, wheezing.  Gastrointestinal: Denies dysphagia, odynophagia, heartburn, reflux, water brash, abdominal pain or cramps, nausea, vomiting, bloating, diarrhea, constipation, hematemesis, melena, hematochezia  or hemorrhoids. Genitourinary: Denies dysuria, frequency, urgency, nocturia, hesitancy, discharge, hematuria or flank pain. Musculoskeletal: Denies arthralgias, myalgias, stiffness, jt. swelling, pain, limping or strain/sprain.  Skin: Denies pruritus, rash, hives, warts, acne, eczema or change in skin lesion(s). Neuro: No weakness, tremor, incoordination, spasms, paresthesia or pain. Psychiatric: Denies confusion, memory loss or sensory loss. Endo: Denies change in weight, skin or hair change.  Heme/Lymph: No excessive bleeding, bruising or enlarged lymph nodes.  Physical Exam  BP 112/80 mmHg  Pulse 64  Temp(Src) 97.5 F (36.4 C)  Resp 16  Ht 5' 8.75" (1.746  m)  Wt 180 lb 9.6 oz (81.92 kg)  BMI 26.87 kg/m2  Appears well nourished and in no distress. Eyes: PERRLA, EOMs, conjunctiva no swelling or erythema. Sinuses: No frontal/maxillary tenderness ENT/Mouth: EAC's clear, TM's nl w/o erythema, bulging. Nares clear w/o erythema, swelling, exudates. Oropharynx clear without erythema or exudates. Oral hygiene is good. Tongue normal, non obstructing. Hearing intact.  Neck: Supple. Thyroid nl. Car 2+/2+ without bruits, nodes or  JVD. Chest: Respirations nl with BS clear & equal w/o rales, rhonchi, wheezing or stridor.  Cor: Heart sounds normal w/ regular rate and rhythm without sig. murmurs, gallops, clicks, or rubs. Peripheral pulses normal and equal  without edema.  Abdomen: Soft & bowel sounds normal. Non-tender w/o guarding, rebound, hernias, masses, or organomegaly.  Lymphatics: Unremarkable.  Musculoskeletal: Full ROM all peripheral extremities, joint stability, 5/5 strength, and normal gait.  Skin: Warm, dry without exposed rashes, lesions or ecchymosis apparent.  Neuro: Cranial nerves intact, reflexes equal bilaterally. Sensory-motor testing grossly intact. Tendon reflexes grossly intact.  Pysch: Alert & oriented x 3.  Insight and judgement nl & appropriate. No ideations.  Assessment and Plan:  1. Elevated BP, hx/o  - Continue monitor blood pressure at home. Continue diet/meds same.  2. Elevated PSA  - PSA  3. Mixed hyperlipidemia   - Continue diet/meds, exercise,& lifestyle modifications. Continue monitor periodic cholesterol/liver & renal functions   4. Vitamin D deficiency   - Continue supplementation.  5. Hx/o elevated Glucose   - Hemoglobin A1c - CBC with Differential/Platelet   Recommended regular exercise, BP monitoring, weight control, and discussed med and SE's. Recommended labs to assess and monitor clinical status. Further disposition pending results of labs. Over 30 minutes of exam, counseling, chart review was performed

## 2016-01-02 NOTE — Patient Instructions (Signed)

## 2016-01-03 LAB — HEMOGLOBIN A1C
Hgb A1c MFr Bld: 5.4 % (ref <5.7)
MEAN PLASMA GLUCOSE: 108 mg/dL

## 2016-01-03 LAB — PSA: PSA: 1.73 ng/mL (ref <=4.00)

## 2016-12-31 ENCOUNTER — Encounter: Payer: Self-pay | Admitting: Internal Medicine

## 2017-01-31 NOTE — Progress Notes (Signed)
ADULT & ADOLESCENT INTERNAL MEDICINE   Unk Pinto, M.D.      Uvaldo Bristle. Silverio Lay, P.A.-C [facility]                [address] [place], [place] [address] Telephone [phone] Telefax [phone] Annual  Screening/Preventative Visit  & Comprehensive Evaluation & Examination     This very nice 61 y.o.  DWM who presents for a Screening/Preventative Visit & comprehensive evaluation and management of multiple medical co-morbidities.  Patient is followed expectantly for labile HTN, Prediabetes, Hyperlipidemia and Vitamin D Deficiency.Patient does have hx/o Ulcerative Colitis predating since colonoscopies  in 2010 and 2013 and recc f/u colon in 10 years (2023). Patient has been taking Lialda daily w/o exascerbation of sx's.      Patient has been followed for labile HTN circa 2007. Today's BP is at goal 126/74. Patient denies any cardiac symptoms as chest pain, palpitations, shortness of breath, dizziness or ankle swelling.     Patient has hx/o mixed HLD. Last lipids in Sept 2012 showed T Chol 193, Tg 137, HDL 55 and elev LDL 111.      Patient has hx/o abnormal elevated glucoses in the past. Patient denies reactive hypoglycemic symptoms, visual blurring, diabetic polys or paresthesias. Last A1c was at goal: Lab Results  Component Value Date   HGBA1C 5.4 01/02/2016       Finally, patient has history of Vitamin D Deficiency "44" on treatment in 2008 and last vitamin D was "18" in 2013. Current Outpatient Prescriptions on File Prior to Visit  Medication Sig  . aspirin 81 MG  Take  daily.  Marland Kitchen VITAMIN D Take 6,000 Int'l Units  daily.  . mesalamine (LIALDA) 1.2 G EC 1  daily for colitis  . Multi-Vit-Minerals  Take 1 tab daily  . PROBIOTIC Take 1 cap daily.   Past Medical History:  Diagnosis Date  . Anemia   . Hyperlipidemia   . Internal hemorrhoids   . Ulcerative colitis, left sided ([place])   . Vitamin D deficiency    Health  Maintenance  Topic Date Due  . Hepatitis C Screening  [birth year], 1956  . HIV Screening  07/25/1971  . TETANUS/TDAP  07/25/1975  . INFLUENZA VACCINE  02/10/2017  . COLONOSCOPY  04/30/2019    There is no immunization history on file for this patient. No past surgical history on file. Family History  Problem Relation Age of Onset  . Colon cancer Neg Hx   . Coronary artery disease Father   . Lung cancer Mother    Social History   Social History  . Marital status: Divorced    Spouse name: N/A  . Number of children: 0  . Years of education: N/A   Occupational History  .  [employer]   Social History Main Topics  . Smoking status: Former Smoker    Quit date: 04/20/2003  . Smokeless tobacco: Never Used  . Alcohol use 4.2 oz/week    7 Cans of beer per week     Comment: 2 drinks a day  . Drug use: No  . Sexual activity: Not on file    ROS Constitutional: Denies fever, chills, weight loss/gain, headaches, insomnia,  night sweats or change in appetite. Does c/o fatigue. Eyes: Denies redness, blurred vision, diplopia, discharge, itchy or watery eyes.  ENT: Denies discharge, congestion, post nasal drip, epistaxis, sore throat, earache,  hearing loss, dental pain, Tinnitus, Vertigo, Sinus pain or snoring.  Cardio: Denies chest pain, palpitations, irregular heartbeat, syncope, dyspnea, diaphoresis, orthopnea, PND, claudication or edema Respiratory: denies cough, dyspnea, DOE, pleurisy, hoarseness, laryngitis or wheezing.  Gastrointestinal: Denies dysphagia, heartburn, reflux, water brash, pain, cramps, nausea, vomiting, bloating, diarrhea, constipation, hematemesis, melena, hematochezia, jaundice or hemorrhoids Genitourinary: Denies dysuria, frequency, urgency, nocturia, hesitancy, discharge, hematuria or flank pain Musculoskeletal: Denies arthralgia, myalgia, stiffness, Jt. Swelling, pain, limp or strain/sprain. Denies Falls. Skin: Denies puritis, rash, hives, warts, acne, eczema  or change in skin lesion Neuro: No weakness, tremor, incoordination, spasms, paresthesia or pain Psychiatric: Denies confusion, memory loss or sensory loss. Denies Depression. Endocrine: Denies change in weight, skin, hair change, nocturia, and paresthesia, diabetic polys, visual blurring or hyper / hypo glycemic episodes.  Heme/Lymph: No excessive bleeding, bruising or enlarged lymph nodes.  Physical Exam  BP 126/74   Pulse 80   Temp (!) 97.5 F (36.4 C)   Resp 18   Ht 5' 8.75" (1.746 m)   Wt 174 lb (78.9 kg)   BMI 25.88 kg/m   General Appearance: Well nourished and well groomed and in no apparent distress.  Eyes: PERRLA, EOMs, conjunctiva no swelling or erythema, normal fundi and vessels. Sinuses: No frontal/maxillary tenderness ENT/Mouth: EACs patent / TMs  nl. Nares clear without erythema, swelling, mucoid exudates. Oral hygiene is good. No erythema, swelling, or exudate. Tongue normal, non-obstructing. Tonsils not swollen or erythematous. Hearing normal.  Neck: Supple, thyroid normal. No bruits, nodes or JVD. Respiratory: Respiratory effort normal.  BS equal and clear bilateral without rales, rhonci, wheezing or stridor. Cardio: Heart sounds are normal with regular rate and rhythm and no murmurs, rubs or gallops. Peripheral pulses are normal and equal bilaterally without edema. No aortic or femoral bruits. Chest: symmetric with normal excursions and percussion.  Abdomen: Soft, with Nl bowel sounds. Nontender, no guarding, rebound, hernias, masses, or organomegaly.  Lymphatics: Non tender without lymphadenopathy.  Genitourinary: No hernias.Testes nl. DRE - prostate nl for age - smooth & firm w/o nodules. Musculoskeletal: Full ROM all peripheral extremities, joint stability, 5/5 strength, and normal gait. Skin: Warm and dry without rashes, lesions, cyanosis, clubbing or  ecchymosis.  Neuro: Cranial nerves intact, reflexes equal bilaterally. Normal muscle tone, no cerebellar  symptoms. Sensation intact.  Pysch: Alert and oriented x 3 with normal affect, insight and judgment appropriate.   Assessment and Plan  1. Annual Preventative/Screening Exam   2. Elevated BP without diagnosis of hypertension  3. Hyperlipidemia  - Lipid panel  4. Vitamin D deficiency  - VITAMIN D 25 Hydroxy  5. Elevated PSA  - PSA  6. Screening for colorectal cancer  - POC Hemoccult Bld/Stl  7. Medication management  - VITAMIN D 25 Hydroxy        Patient was counseled in prudent diet, weight control to achieve/maintain BMI less than 25, BP monitoring, regular exercise and medications as discussed.  Discussed med effects and SE's. Routine screening labs and tests limited due to self insured status. Over 40 minutes of exam, counseling, chart review and high complex critical decision making was performed

## 2017-01-31 NOTE — Patient Instructions (Signed)

## 2017-02-01 ENCOUNTER — Encounter: Payer: Self-pay | Admitting: Internal Medicine

## 2017-02-01 ENCOUNTER — Ambulatory Visit (INDEPENDENT_AMBULATORY_CARE_PROVIDER_SITE_OTHER): Payer: Self-pay | Admitting: Internal Medicine

## 2017-02-01 VITALS — BP 126/74 | HR 80 | Temp 97.5°F | Resp 18 | Ht 68.75 in | Wt 174.0 lb

## 2017-02-01 DIAGNOSIS — Z1212 Encounter for screening for malignant neoplasm of rectum: Secondary | ICD-10-CM

## 2017-02-01 DIAGNOSIS — Z1211 Encounter for screening for malignant neoplasm of colon: Secondary | ICD-10-CM

## 2017-02-01 DIAGNOSIS — E782 Mixed hyperlipidemia: Secondary | ICD-10-CM

## 2017-02-01 DIAGNOSIS — Z0001 Encounter for general adult medical examination with abnormal findings: Secondary | ICD-10-CM

## 2017-02-01 DIAGNOSIS — E559 Vitamin D deficiency, unspecified: Secondary | ICD-10-CM

## 2017-02-01 DIAGNOSIS — R972 Elevated prostate specific antigen [PSA]: Secondary | ICD-10-CM

## 2017-02-01 DIAGNOSIS — R030 Elevated blood-pressure reading, without diagnosis of hypertension: Secondary | ICD-10-CM

## 2017-02-01 DIAGNOSIS — Z79899 Other long term (current) drug therapy: Secondary | ICD-10-CM

## 2017-02-01 LAB — LIPID PANEL
CHOLESTEROL: 215 mg/dL — AB (ref <200)
HDL: 39 mg/dL — AB (ref >40)
LDL Cholesterol: 139 mg/dL — ABNORMAL HIGH (ref <100)
TRIGLYCERIDES: 185 mg/dL — AB (ref <150)
Total CHOL/HDL Ratio: 5.5 Ratio — ABNORMAL HIGH (ref <5.0)
VLDL: 37 mg/dL — AB (ref <30)

## 2017-02-02 LAB — PSA: PSA: 1.2 ng/mL (ref <=4.0)

## 2017-02-02 LAB — VITAMIN D 25 HYDROXY (VIT D DEFICIENCY, FRACTURES): Vit D, 25-Hydroxy: 82 ng/mL (ref 30–100)

## 2017-03-17 ENCOUNTER — Other Ambulatory Visit: Payer: Self-pay | Admitting: Dermatology

## 2017-06-28 ENCOUNTER — Other Ambulatory Visit: Payer: Self-pay | Admitting: Dermatology

## 2018-02-23 ENCOUNTER — Ambulatory Visit (INDEPENDENT_AMBULATORY_CARE_PROVIDER_SITE_OTHER): Payer: Self-pay | Admitting: Internal Medicine

## 2018-02-23 VITALS — BP 116/72 | HR 80 | Temp 97.5°F | Resp 16 | Ht 69.0 in | Wt 177.0 lb

## 2018-02-23 DIAGNOSIS — E559 Vitamin D deficiency, unspecified: Secondary | ICD-10-CM

## 2018-02-23 DIAGNOSIS — R030 Elevated blood-pressure reading, without diagnosis of hypertension: Secondary | ICD-10-CM

## 2018-02-23 DIAGNOSIS — E782 Mixed hyperlipidemia: Secondary | ICD-10-CM

## 2018-02-23 DIAGNOSIS — Z79899 Other long term (current) drug therapy: Secondary | ICD-10-CM

## 2018-02-23 NOTE — Progress Notes (Signed)
This very nice 62 y.o. DWM presents for 3 month follow up with labileHTN, HLD, Pre-Diabetes and Vitamin D Deficiency. Patient does have hx/o Ulcerative Colitis predating since 2010 and 2013 and recc f/u colon in 10 years (2023). Patient has been taking Lialda daily w/o exascerbation of sx's.      Patient is followed expectantly for labile HTN since 2007  & BP has been controlled at home. Today's BP is at goal - 116/72. Patient has had no complaints of any cardiac type chest pain, palpitations, dyspnea / orthopnea / PND, dizziness, claudication, or dependent edema.     Hyperlipidemia is not controlled with diet & meds. Patient denies myalgias or other med SE's. Last Lipids were  Lab Results  Component Value Date   CHOL 223 (H) 02/23/2018   HDL 40 (L) 02/23/2018   LDLCALC 145 (H) 02/23/2018   TRIG 250 (H) 02/23/2018   CHOLHDL 5.6 (H) 02/23/2018      Also, the patient has history of abnormal elevated glucoses in the past and has had no symptoms of reactive hypoglycemia, diabetic polys, paresthesias or visual blurring.  Last A1c was Normal & at goal: Lab Results  Component Value Date   HGBA1C 5.4 01/02/2016      Further, the patient also has history of Vitamin D Deficiency "(41".on treatment.2008) and supplements vitamin D without any suspected side-effects. Last vitamin D was at goal: Lab Results  Component Value Date   VD25OH 82 02/01/2017   Current Outpatient Medications on File Prior to Visit  Medication Sig  . aspirin 81 MG tablet Take 81 mg by mouth daily.  . Cholecalciferol (VITAMIN D PO) Take 6,000 Int'l Units by mouth daily.  . mesalamine (LIALDA) 1.2 G EC tablet 1 to 2 tabs daily for colitis  . Multiple Vitamins-Minerals (MULTIVITAMIN PO) Take by mouth. Take 1 tab daily  . Probiotic Product (PROBIOTIC PO) Take 1 capsule by mouth daily.   No current facility-administered medications on file prior to visit.    No Known Allergies  PMHx:   Past Medical History:  Diagnosis  Date  . Anemia   . Hyperlipidemia   . Internal hemorrhoids   . Ulcerative colitis, left sided ([place])   . Vitamin D deficiency    Immunization History  Administered Date(s) Administered  . Td 07/13/2001   History reviewed. No pertinent surgical history. FHx:    Reviewed / unchanged  SHx:    Reviewed / unchanged   Systems Review:  Constitutional: Denies fever, chills, wt changes, headaches, insomnia, fatigue, night sweats, change in appetite. Eyes: Denies redness, blurred vision, diplopia, discharge, itchy, watery eyes.  ENT: Denies discharge, congestion, post nasal drip, epistaxis, sore throat, earache, hearing loss, dental pain, tinnitus, vertigo, sinus pain, snoring.  CV: Denies chest pain, palpitations, irregular heartbeat, syncope, dyspnea, diaphoresis, orthopnea, PND, claudication or edema. Respiratory: denies cough, dyspnea, DOE, pleurisy, hoarseness, laryngitis, wheezing.  Gastrointestinal: Denies dysphagia, odynophagia, heartburn, reflux, water brash, abdominal pain or cramps, nausea, vomiting, bloating, diarrhea, constipation, hematemesis, melena, hematochezia  or hemorrhoids. Genitourinary: Denies dysuria, frequency, urgency, nocturia, hesitancy, discharge, hematuria or flank pain. Musculoskeletal: Denies arthralgias, myalgias, stiffness, jt. swelling, pain, limping or strain/sprain.  Skin: Denies pruritus, rash, hives, warts, acne, eczema or change in skin lesion(s). Neuro: No weakness, tremor, incoordination, spasms, paresthesia or pain. Psychiatric: Denies confusion, memory loss or sensory loss. Endo: Denies change in weight, skin or hair change.  Heme/Lymph: No excessive bleeding, bruising or enlarged lymph nodes.  Physical Exam  BP  116/72   Pulse 80   Temp (!) 97.5 F (36.4 C)   Resp 16   Ht 5\' 9"  (1.753 m)   Wt 177 lb (80.3 kg)   BMI 26.14 kg/m   Appears  well nourished, well groomed  and in no distress.  Eyes: PERRLA, EOMs, conjunctiva no swelling or  erythema. Sinuses: No frontal/maxillary tenderness ENT/Mouth: EAC's clear, TM's nl w/o erythema, bulging. Nares clear w/o erythema, swelling, exudates. Oropharynx clear without erythema or exudates. Oral hygiene is good. Tongue normal, non obstructing. Hearing intact.  Neck: Supple. Thyroid not palpable. Car 2+/2+ without bruits, nodes or JVD. Chest: Respirations nl with BS clear & equal w/o rales, rhonchi, wheezing or stridor.  Cor: Heart sounds normal w/ regular rate and rhythm without sig. murmurs, gallops, clicks or rubs. Peripheral pulses normal and equal  without edema.  Abdomen: Soft & bowel sounds normal. Non-tender w/o guarding, rebound, hernias, masses or organomegaly.  Lymphatics: Unremarkable.  Musculoskeletal: Full ROM all peripheral extremities, joint stability, 5/5 strength and normal gait.  Skin: Warm, dry without exposed rashes, lesions or ecchymosis apparent.  Neuro: Cranial nerves intact, reflexes equal bilaterally. Sensory-motor testing grossly intact. Tendon reflexes grossly intact.  Pysch: Alert & oriented x 3.  Insight and judgement nl & appropriate. No ideations.  Assessment and Plan:  1. Elevated BP without diagnosis of hypertension  - Continue monitor blood pressure at home.  - Continue DASH diet.  Reminder to go to the ER if any CP,  SOB, nausea, dizziness, severe HA, changes vision/speech.   2. Hyperlipidemia, mixed  - Continue diet/meds, exercise,& lifestyle modifications.  - Continue monitor periodic cholesterol/liver   - Lipid panel  3. Vitamin D deficiency  4. Medication management    - Continue diet, exercise, lifestyle modifications.  - Continue supplementation.       Discussed  regular exercise, BP monitoring, weight control to achieve/maintain BMI less than 25 and discussed med and SE's. Patient requested to defer extensive  lab testing due to lack of insurance. Over 30 minutes of exam, counseling, chart review was performed.

## 2018-02-23 NOTE — Patient Instructions (Signed)

## 2018-02-24 ENCOUNTER — Other Ambulatory Visit: Payer: Self-pay | Admitting: Internal Medicine

## 2018-02-24 DIAGNOSIS — E782 Mixed hyperlipidemia: Secondary | ICD-10-CM

## 2018-02-24 LAB — LIPID PANEL
CHOL/HDL RATIO: 5.6 (calc) — AB (ref <5.0)
CHOLESTEROL: 223 mg/dL — AB (ref <200)
HDL: 40 mg/dL — ABNORMAL LOW (ref >40)
LDL CHOLESTEROL (CALC): 145 mg/dL — AB
Non-HDL Cholesterol (Calc): 183 mg/dL (calc) — ABNORMAL HIGH (ref <130)
Triglycerides: 250 mg/dL — ABNORMAL HIGH (ref <150)

## 2018-02-24 MED ORDER — ROSUVASTATIN CALCIUM 40 MG PO TABS: ORAL_TABLET | ORAL | 1 refills | Status: DC

## 2018-02-27 ENCOUNTER — Encounter: Payer: Self-pay | Admitting: Internal Medicine

## 2018-07-27 ENCOUNTER — Other Ambulatory Visit: Payer: Self-pay | Admitting: Dermatology

## 2019-03-15 ENCOUNTER — Ambulatory Visit (INDEPENDENT_AMBULATORY_CARE_PROVIDER_SITE_OTHER): Payer: Self-pay | Admitting: Internal Medicine

## 2019-03-15 ENCOUNTER — Other Ambulatory Visit: Payer: Self-pay

## 2019-03-15 VITALS — BP 116/72 | HR 76 | Temp 97.4°F | Resp 16 | Ht 69.0 in | Wt 175.0 lb

## 2019-03-15 DIAGNOSIS — Z125 Encounter for screening for malignant neoplasm of prostate: Secondary | ICD-10-CM

## 2019-03-15 DIAGNOSIS — R7309 Other abnormal glucose: Secondary | ICD-10-CM

## 2019-03-15 DIAGNOSIS — E559 Vitamin D deficiency, unspecified: Secondary | ICD-10-CM

## 2019-03-15 DIAGNOSIS — Z79899 Other long term (current) drug therapy: Secondary | ICD-10-CM

## 2019-03-15 DIAGNOSIS — E782 Mixed hyperlipidemia: Secondary | ICD-10-CM

## 2019-03-15 DIAGNOSIS — R0989 Other specified symptoms and signs involving the circulatory and respiratory systems: Secondary | ICD-10-CM

## 2019-03-15 NOTE — Patient Instructions (Signed)

## 2019-03-15 NOTE — Progress Notes (Signed)
This very nice 63 y.o.male  for HTN, HLD, Prediabetes and Vitamin D Deficiency. Patient has hx/o Ulcerative Colitis predating since 2010 and had been on on Lialda and had last Colonoscopy in 2013 with  10 year recommended follow-up in 2023.   Over the last year he has been off of the Lialda & has had no significant GI complaints.      Patient is followed since 2007 for labile HTN. Patient's BP has been controlled at home.  Today's BP is at goal - 116/72. Patient denies any cardiac symptoms as chest pain, palpitations, shortness of breath, dizziness or ankle swelling.     Patient's hyperlipidemia was controlled in the past with diet and medications. Patient denies myalgias or other medication SE's. Patient has been off of his Crestor and last lipids were not at goal: Lab Results  Component Value Date   CHOL 223 (H) 02/23/2018   HDL 40 (L) 02/23/2018   LDLCALC 145 (H) 02/23/2018   TRIG 250 (H) 02/23/2018   CHOLHDL 5.6 (H) 02/23/2018      Patient is screened proactively for glucose intolerance and patient denies reactive hypoglycemic symptoms, visual blurring, diabetic polys or paresthesias. Last A1c was normal 3 years ago. Lab Results  Component Value Date   HGBA1C 5.4 01/02/2016       Finally, patient has history of Vitamin D Deficiency  and last vitamin D was at goal: Lab Results  Component Value Date   VD25OH 82 02/01/2017   Current Outpatient Medications on File Prior to Visit  Medication Sig  . aspirin 81 MG tablet Take 81 mg by mouth daily.  . Cholecalciferol (VITAMIN D PO) Take 6,000 Int'l Units by mouth daily.  . Multiple Vitamins-Minerals (MULTIVITAMIN PO) Take by mouth. Take 1 tab daily  . Probiotic Product (PROBIOTIC PO) Take 1 capsule by mouth daily.   No current facility-administered medications on file prior to visit.    Past Medical History:  Diagnosis Date  . Anemia   . Hyperlipidemia   . Internal hemorrhoids   . Ulcerative colitis, left sided ([place])   .  Vitamin D deficiency    Health Maintenance  Topic Date Due  . Hepatitis C Screening  11-23-1955  . HIV Screening  07/25/1971  . TETANUS/TDAP  07/14/2011  . INFLUENZA VACCINE  02/11/2019  . COLONOSCOPY  04/30/2019   Immunization History  Administered Date(s) Administered  . Td 07/13/2001   Last Colon - 04/29/2012 Colon - 7 yr f/u due Oct 2020  No past surgical history on file.   Family History  Problem Relation Age of Onset  . Colon cancer Neg Hx   . Coronary artery disease Father   . Lung cancer Mother    Social History   Socioeconomic History  . Marital status: Divorced    Spouse name: Not on file  . Number of children: 0  . Years of education: Not on file  . Highest education level: Not on file  Occupational History    Employer: [employer]  Tobacco Use  . Smoking status: Former Smoker    Quit date: 04/20/2003    Years since quitting: 15.9  . Smokeless tobacco: Never Used  Substance and Sexual Activity  . Alcohol use: Yes    Alcohol/week: 7.0 standard drinks    Types: 7 Cans of beer per week    Comment: 2 drinks a day  . Drug use: No  . Sexual activity: Not on file  ROS Constitutional: Denies fever, chills, weight loss/gain, headaches, insomnia,  night sweats or change in appetite. Does c/o fatigue. Eyes: Denies redness, blurred vision, diplopia, discharge, itchy or watery eyes.  ENT: Denies discharge, congestion, post nasal drip, epistaxis, sore throat, earache, hearing loss, dental pain, Tinnitus, Vertigo, Sinus pain or snoring.  Cardio: Denies chest pain, palpitations, irregular heartbeat, syncope, dyspnea, diaphoresis, orthopnea, PND, claudication or edema Respiratory: denies cough, dyspnea, DOE, pleurisy, hoarseness, laryngitis or wheezing.  Gastrointestinal: Denies dysphagia, heartburn, reflux, water brash, pain, cramps, nausea, vomiting, bloating, diarrhea, constipation, hematemesis, melena, hematochezia, jaundice or hemorrhoids  Genitourinary: Denies dysuria, frequency, urgency, nocturia, hesitancy, discharge, hematuria or flank pain Musculoskeletal: Denies arthralgia, myalgia, stiffness, Jt. Swelling, pain, limp or strain/sprain. Denies Falls. Skin: Denies puritis, rash, hives, warts, acne, eczema or change in skin lesion Neuro: No weakness, tremor, incoordination, spasms, paresthesia or pain Psychiatric: Denies confusion, memory loss or sensory loss. Denies Depression. Endocrine: Denies change in weight, skin, hair change, nocturia, and paresthesia, diabetic polys, visual blurring or hyper / hypo glycemic episodes.  Heme/Lymph: No excessive bleeding, bruising or enlarged lymph nodes.  Physical Exam  BP 116/72   Pulse 76   Temp (!) 97.4 F (36.3 C)   Resp 16   Ht 5\' 9"  (1.753 m)   Wt 175 lb (79.4 kg)   BMI 25.84 kg/m   General Appearance: Well nourished and well groomed and in no apparent distress.  Eyes: PERRLA, EOMs, conjunctiva no swelling or erythema, normal fundi and vessels. Sinuses: No frontal/maxillary tenderness ENT/Mouth: EACs patent / TMs  nl. Nares clear without erythema, swelling, mucoid exudates. Oral hygiene is good. No erythema, swelling, or exudate. Tongue normal, non-obstructing. Tonsils not swollen or erythematous. Hearing normal.  Neck: Supple, thyroid not palpable. No bruits, nodes or JVD. Respiratory: Respiratory effort normal.  BS equal and clear bilateral without rales, rhonci, wheezing or stridor. Cardio: Heart sounds are normal with regular rate and rhythm and no murmurs, rubs or gallops. Peripheral pulses are normal and equal bilaterally without edema. No aortic or femoral bruits. Chest: symmetric with normal excursions and percussion.  Abdomen: Soft, with Nl bowel sounds. Nontender, no guarding, rebound, hernias, masses, or organomegaly.  Lymphatics: Non tender without lymphadenopathy.  Musculoskeletal: Full ROM all peripheral extremities, joint stability, 5/5 strength, and normal  gait. Skin: Warm and dry without rashes, lesions, cyanosis, clubbing or  ecchymosis.  Neuro: Cranial nerves intact, reflexes equal bilaterally. Normal muscle tone, no cerebellar symptoms. Sensation intact.  Pysch: Alert and oriented X 3 with normal affect, insight and judgment appropriate.   Assessment and Plan  1. Labile hypertension  - COMPLETE METABOLIC PANEL WITH GFR  2. Hyperlipidemia, mixed  - Lipid panel - rosuvastatin (CRESTOR) 40 MG tablet; Take 1/2 to 1 tablet daily or as directed for Cholesterol  Dispense: 30 tablet; Refill: 1  3. Abnormal glucose  - COMPLETE METABOLIC PANEL WITH GFR  4. Vitamin D deficiency  5. Prostate cancer screening  - PSA  6. Medication management  - COMPLETE METABOLIC PANEL WITH GFR - Lipid panel - PSA       Patient was counseled in prudent diet, weight control to achieve/maintain BMI less than 25, BP monitoring, regular exercise and medications as discussed.  Discussed med effects and SE's. Labs were limited due to loss of Insurance.  Over 25 minutes of exam, counseling, chart review and high complex critical decision making was performed   Kirtland Bouchard, MD

## 2019-03-16 ENCOUNTER — Other Ambulatory Visit: Payer: Self-pay | Admitting: Internal Medicine

## 2019-03-16 DIAGNOSIS — E782 Mixed hyperlipidemia: Secondary | ICD-10-CM

## 2019-03-16 LAB — COMPLETE METABOLIC PANEL WITH GFR
AG Ratio: 2 (calc) (ref 1.0–2.5)
ALT: 16 U/L (ref 9–46)
AST: 16 U/L (ref 10–35)
Albumin: 4.3 g/dL (ref 3.6–5.1)
Alkaline phosphatase (APISO): 61 U/L (ref 35–144)
BUN: 22 mg/dL (ref 7–25)
CO2: 25 mmol/L (ref 20–32)
Calcium: 9.3 mg/dL (ref 8.6–10.3)
Chloride: 106 mmol/L (ref 98–110)
Creat: 1.08 mg/dL (ref 0.70–1.25)
GFR, Est African American: 85 mL/min/{1.73_m2} (ref >=60)
GFR, Est Non African American: 73 mL/min/{1.73_m2} (ref >=60)
Globulin: 2.2 g/dL (calc) (ref 1.9–3.7)
Glucose, Bld: 89 mg/dL (ref 65–99)
Potassium: 4.5 mmol/L (ref 3.5–5.3)
Sodium: 138 mmol/L (ref 135–146)
Total Bilirubin: 0.5 mg/dL (ref 0.2–1.2)
Total Protein: 6.5 g/dL (ref 6.1–8.1)

## 2019-03-16 LAB — PSA: PSA: 2.1 ng/mL (ref <=4.0)

## 2019-03-16 LAB — LIPID PANEL
Cholesterol: 245 mg/dL — ABNORMAL HIGH (ref <200)
HDL: 41 mg/dL (ref >=40)
LDL Cholesterol (Calc): 173 mg/dL (calc) — ABNORMAL HIGH
Non-HDL Cholesterol (Calc): 204 mg/dL (calc) — ABNORMAL HIGH (ref <130)
Total CHOL/HDL Ratio: 6 (calc) — ABNORMAL HIGH (ref <5.0)
Triglycerides: 162 mg/dL — ABNORMAL HIGH (ref <150)

## 2019-03-17 MED ORDER — ROSUVASTATIN CALCIUM 40 MG PO TABS: ORAL_TABLET | ORAL | 1 refills | Status: DC

## 2019-04-20 ENCOUNTER — Encounter: Payer: Self-pay | Admitting: Gastroenterology

## 2019-07-17 ENCOUNTER — Ambulatory Visit: Payer: Self-pay | Admitting: *Deleted

## 2019-07-17 ENCOUNTER — Ambulatory Visit: Payer: Self-pay | Attending: Internal Medicine

## 2019-07-17 DIAGNOSIS — U071 COVID-19: Secondary | ICD-10-CM | POA: Insufficient documentation

## 2019-07-17 DIAGNOSIS — Z20822 Contact with and (suspected) exposure to covid-19: Secondary | ICD-10-CM

## 2019-07-17 NOTE — Telephone Encounter (Signed)
Pt called with having a fever of 102. Also having chills, headache. He wants to know what can he take for his fever. Advised to alternate Tylenol with ibuprofen. Hydrated and cough and deep breaths. He was tested for covid today.  Also advised to quarantine at least until he gets the test results. He voiced understanding.

## 2019-07-18 LAB — NOVEL CORONAVIRUS, NAA: SARS-CoV-2, NAA: DETECTED — AB

## 2019-12-05 ENCOUNTER — Other Ambulatory Visit: Payer: Self-pay

## 2019-12-05 ENCOUNTER — Encounter: Payer: Self-pay | Admitting: Adult Health Nurse Practitioner

## 2019-12-05 ENCOUNTER — Ambulatory Visit (INDEPENDENT_AMBULATORY_CARE_PROVIDER_SITE_OTHER): Payer: Self-pay | Admitting: Adult Health Nurse Practitioner

## 2019-12-05 VITALS — BP 118/76 | HR 55 | Temp 97.7°F | Ht 69.0 in | Wt 176.0 lb

## 2019-12-05 DIAGNOSIS — Z79899 Other long term (current) drug therapy: Secondary | ICD-10-CM

## 2019-12-05 DIAGNOSIS — E782 Mixed hyperlipidemia: Secondary | ICD-10-CM

## 2019-12-05 DIAGNOSIS — R0989 Other specified symptoms and signs involving the circulatory and respiratory systems: Secondary | ICD-10-CM

## 2019-12-05 LAB — LIPID PANEL
Cholesterol: 216 mg/dL — ABNORMAL HIGH (ref <200)
HDL: 48 mg/dL (ref >=40)
LDL Cholesterol (Calc): 141 mg/dL (calc) — ABNORMAL HIGH
Non-HDL Cholesterol (Calc): 168 mg/dL (calc) — ABNORMAL HIGH (ref <130)
Total CHOL/HDL Ratio: 4.5 (calc) (ref <5.0)
Triglycerides: 148 mg/dL (ref <150)

## 2019-12-05 NOTE — Progress Notes (Signed)
Assessment and Plan:  Jeffrey Gallagher was seen today for hyperlipidemia.  Diagnoses and all orders for this visit:  Hyperlipidemia, mixed Not taking rosuvastatin at this time Continue diet and lifestyles modification  -     Lipid panel  Labile hypertension Continue current medications: Monitor blood pressure at home; call if consistently over 130/80 Continue DASH diet.   Reminder to go to the ER if any CP, SOB, nausea, dizziness, severe HA, changes vision/speech, left arm numbness and tingling and jaw pain.  Medication management Continued   Further disposition pending results of labs. Discussed med's effects and SE's.   Over 20 minutes of face to face interview, exam, counseling, chart review, and critical decision making was performed.   Future Appointments  Date Time Provider [place]  04/16/2020  2:00 PM Unk Pinto, MD [facility] None    ------------------------------------------------------------------------------------------------------------------   HPI 64 y.o.male presents for evaluation of cholesterol medication.  He started taking half tablet of rosuvastatin 40mg .  He reports he did well with the medication.  He also made dietary changes in conjunction with this.  He stopped drinking a gallon of milk and decreased his red meat intakes.  He was to return in March time frame to re-check labs.  He has continued with his dietary and lifestyle changes to improve his cholesterol in hopes of staying of statin/cholesrol medication.  Past Medical History:  Diagnosis Date  . Anemia   . Hyperlipidemia   . Internal hemorrhoids   . Ulcerative colitis, left sided ([place])   . Vitamin D deficiency      Not on File  Current Outpatient Medications on File Prior to Visit  Medication Sig  . aspirin 81 MG tablet Take 81 mg by mouth daily.  . Cholecalciferol (VITAMIN D PO) Take 6,000 Int'l Units by mouth daily.  . Multiple Vitamins-Minerals (MULTIVITAMIN PO) Take by mouth. Take  1 tab daily  . Probiotic Product (PROBIOTIC PO) Take 1 capsule by mouth daily.  . rosuvastatin (CRESTOR) 40 MG tablet Take 1/2 to 1 tablet daily or as directed for Cholesterol (Patient not taking: Reported on 12/05/2019)   No current facility-administered medications on file prior to visit.    ROS: all negative except above.   Physical Exam:  BP 118/76   Pulse (!) 55   Temp 97.7 F (36.5 C)   Ht 5\' 9"  (1.753 m)   Wt 176 lb (79.8 kg)   SpO2 97%   BMI 25.99 kg/m   General Appearance: Well nourished, in no apparent distress. Respiratory: Respiratory effort normal, BS equal bilaterally without rales, rhonchi, wheezing or stridor.  Cardio: RRR with no MRGs. Brisk peripheral pulses without edema.  Abdomen: Soft, + BS.  Non tender, no guarding, rebound, hernias, masses.  Musculoskeletal: Full ROM, 5/5 strength, normal gait.  Skin: Warm, dry without rashes, lesions, ecchymosis.  Psych: Awake and oriented X 3, normal affect, Insight and Judgment appropriate.     Garnet Sierras, NP 11:22 AM [facility]

## 2019-12-11 NOTE — Patient Instructions (Signed)
declined

## 2020-04-15 NOTE — Progress Notes (Signed)
This very nice 64 y.o.  DWM has been followed for labile HTN, HLD, Prediabetes and Vitamin D Deficiency.     Patient relates recent dental pain with upper Rt incisor & was referred by his dentist to an endodontist anticipating a "root canal". Patient relates he was advised that he had a sinus infection & was treated with 2 courses of Zpak w/o improvement.     Patient has hx/o Ulcerative Colitis predating (2010) treated in the remote past with Lialda. His had last Colonoscopy was in 2013 with  recommended 10 year follow-up - due 2023.       Patient has been followed expectantly for labile HTN since 2007. Patient's BP has been controlled at home.  Today's BP is at goal -  122/64. Patient denies any cardiac symptoms as chest pain, palpitations, shortness of breath, dizziness or ankle swelling.      Patient's hyperlipidemia is not controlled with diet and off Rosuvastatin. Patient denies myalgias or other medication SE's. Last lipids were not at goal:  Lab Results  Component Value Date   CHOL 216 (H) 12/05/2019   HDL 48 12/05/2019   LDLCALC 141 (H) 12/05/2019   TRIG 148 12/05/2019   CHOLHDL 4.5 12/05/2019       Patient is followed expectantly for glucose intolerance and patient denies reactive hypoglycemic symptoms, visual blurring, diabetic polys or paresthesias. Last A1c was Normal & at goal:  Lab Results  Component Value Date   HGBA1C 5.4 01/02/2016        Finally, patient has history of Vitamin D Deficiency of    and last vitamin D was at goal:  Lab Results  Component Value Date   VD25OH 82 02/01/2017    Current Outpatient Medications on File Prior to Visit  Medication Sig  . aspirin 81 MG tablet Take 81 mg by mouth daily.  Marland Kitchen VITAMIN D  Take 6,000 Int'l Units by mouth daily.  . MultiVit-Minerals  Take by mouth. Take 1 tab daily  . OTC Krill oil  1 capsule daily.  . CoQ10  1 capsule daily.  . Flaxseed oil  1 capsule daily.  . Probiotic Product  Take 1 capsule by  mouth daily.     Past Medical History:  Diagnosis Date  . Anemia   . Hyperlipidemia   . Internal hemorrhoids   . Ulcerative colitis, left sided ([place])   . Vitamin D deficiency    Health Maintenance  Topic Date Due  . Hepatitis C Screening  Never done  . COVID-19 Vaccine (1) Never done  . HIV Screening  Never done  . TETANUS/TDAP  07/14/2011  . COLONOSCOPY  04/30/2019  . INFLUENZA VACCINE  Never done   Immunization History  Administered Date(s) Administered  . Td 07/13/2001   Last Colon -   No past surgical history on file. Family History  Problem Relation Age of Onset  . Colon cancer Neg Hx   . Coronary artery disease Father   . Lung cancer Mother    Social History   Socioeconomic History  . Marital status: Divorced    Spouse name: Not on file  . Number of children: 0  . Years of education: Not on file  . Highest education level: Not on file  Occupational History    Employer: [employer]  Tobacco Use  . Smoking status: Former Smoker    Quit date: 04/20/2003    Years since quitting: 17.0  . Smokeless tobacco: Never  Used  Substance and Sexual Activity  . Alcohol use: Yes    Alcohol/week: 7.0 standard drinks    Types: 7 Cans of beer per week    Comment: 2 drinks a day  . Drug use: No  . Sexual activity: Not on file    ROS Constitutional: Denies fever, chills, weight loss/gain, headaches, insomnia,  night sweats or change in appetite. Does c/o fatigue. Eyes: Denies redness, blurred vision, diplopia, discharge, itchy or watery eyes.  ENT: Denies discharge, congestion, post nasal drip, epistaxis, sore throat, earache, hearing loss, dental pain, Tinnitus, Vertigo, Sinus pain or snoring.  Cardio: Denies chest pain, palpitations, irregular heartbeat, syncope, dyspnea, diaphoresis, orthopnea, PND, claudication or edema Respiratory: denies cough, dyspnea, DOE, pleurisy, hoarseness, laryngitis or wheezing.  Gastrointestinal: Denies dysphagia, heartburn,  reflux, water brash, pain, cramps, nausea, vomiting, bloating, diarrhea, constipation, hematemesis, melena, hematochezia, jaundice or hemorrhoids Genitourinary: Denies dysuria, frequency, urgency, nocturia, hesitancy, discharge, hematuria or flank pain Musculoskeletal: Denies arthralgia, myalgia, stiffness, Jt. Swelling, pain, limp or strain/sprain. Denies Falls. Skin: Denies puritis, rash, hives, warts, acne, eczema or change in skin lesion Neuro: No weakness, tremor, incoordination, spasms, paresthesia or pain Psychiatric: Denies confusion, memory loss or sensory loss. Denies Depression. Endocrine: Denies change in weight, skin, hair change, nocturia, and paresthesia, diabetic polys, visual blurring or hyper / hypo glycemic episodes.  Heme/Lymph: No excessive bleeding, bruising or enlarged lymph nodes.  Physical Exam  BP 122/64   Pulse 62   Temp (!) 97.2 F (36.2 C)   Resp 16   Ht 5\' 9"  (1.753 m)   Wt 179 lb 9.6 oz (81.5 kg)   SpO2 97%   BMI 26.52 kg/m   General Appearance: Well nourished and well groomed and in no apparent distress.  Eyes: PERRLA, EOMs, conjunctiva no swelling or erythema, normal fundi and vessels. Sinuses: No frontal/maxillary tenderness ENT/Mouth: EACs patent / TMs  nl. Nares clear without erythema, swelling, mucoid exudates. Oral hygiene is good. No erythema, swelling, or exudate. Tongue normal, non-obstructing. Tonsils not swollen or erythematous. Hearing normal.  Neck: Supple, thyroid not palpable. No bruits, nodes or JVD. Respiratory: Respiratory effort normal.  BS equal and clear bilateral without rales, rhonci, wheezing or stridor. Cardio: Heart sounds are normal with regular rate and rhythm and no murmurs, rubs or gallops. Peripheral pulses are normal and equal bilaterally without edema. No aortic or femoral bruits. Chest: symmetric with normal excursions and percussion.  Abdomen: Soft, with Nl bowel sounds. Nontender, no guarding, rebound, hernias, masses,  or organomegaly.  Lymphatics: Non tender without lymphadenopathy.  Musculoskeletal: Full ROM all peripheral extremities, joint stability, 5/5 strength, and normal gait. Skin: Warm and dry without rashes, lesions, cyanosis, clubbing or  ecchymosis.  Neuro: Cranial nerves intact, reflexes equal bilaterally. Normal muscle tone, no cerebellar symptoms. Sensation intact.  Pysch: Alert and oriented X 3 with normal affect, insight and judgment appropriate.   Assessment and Plan   1. Labile hypertension  - COMPLETE METABOLIC PANEL WITH GFR  2. Hyperlipidemia, mixed  - Lipid panel  3. Glucose intolerance  - COMPLETE METABOLIC PANEL WITH GFR  4. Vitamin D deficiency   5. Prostate cancer screening  - PSA  6. Medication management  - COMPLETE METABOLIC PANEL WITH GFR            Patient was counseled in prudent diet, weight control , BP monitoring, regular exercise and medications as discussed.  Discussed med effects and SE's. Routine screening labs and tests as requested with regular follow-up as recommended. Over  25 minutes of exam, counseling, chart review and high complex critical decision making was performed   Kirtland Bouchard, MD

## 2020-04-15 NOTE — Patient Instructions (Signed)

## 2020-04-16 ENCOUNTER — Ambulatory Visit (INDEPENDENT_AMBULATORY_CARE_PROVIDER_SITE_OTHER): Payer: Self-pay | Admitting: Internal Medicine

## 2020-04-16 ENCOUNTER — Other Ambulatory Visit: Payer: Self-pay

## 2020-04-16 ENCOUNTER — Encounter: Payer: Self-pay | Admitting: Internal Medicine

## 2020-04-16 VITALS — BP 122/64 | HR 62 | Temp 97.2°F | Resp 16 | Ht 69.0 in | Wt 179.6 lb

## 2020-04-16 DIAGNOSIS — Z79899 Other long term (current) drug therapy: Secondary | ICD-10-CM

## 2020-04-16 DIAGNOSIS — E782 Mixed hyperlipidemia: Secondary | ICD-10-CM

## 2020-04-16 DIAGNOSIS — R0989 Other specified symptoms and signs involving the circulatory and respiratory systems: Secondary | ICD-10-CM

## 2020-04-16 DIAGNOSIS — E7439 Other disorders of intestinal carbohydrate absorption: Secondary | ICD-10-CM

## 2020-04-16 DIAGNOSIS — E559 Vitamin D deficiency, unspecified: Secondary | ICD-10-CM

## 2020-04-16 DIAGNOSIS — Z125 Encounter for screening for malignant neoplasm of prostate: Secondary | ICD-10-CM

## 2020-04-16 MED ORDER — AMOXICILLIN-POT CLAVULANATE 875-125 MG PO TABS: ORAL_TABLET | ORAL | 0 refills | Status: DC

## 2020-04-17 ENCOUNTER — Other Ambulatory Visit: Payer: Self-pay | Admitting: Internal Medicine

## 2020-04-17 DIAGNOSIS — E782 Mixed hyperlipidemia: Secondary | ICD-10-CM

## 2020-04-17 LAB — COMPLETE METABOLIC PANEL WITH GFR
AG Ratio: 1.7 (calc) (ref 1.0–2.5)
ALT: 15 U/L (ref 9–46)
AST: 14 U/L (ref 10–35)
Albumin: 3.8 g/dL (ref 3.6–5.1)
Alkaline phosphatase (APISO): 68 U/L (ref 35–144)
BUN: 18 mg/dL (ref 7–25)
CO2: 25 mmol/L (ref 20–32)
Calcium: 8.5 mg/dL — ABNORMAL LOW (ref 8.6–10.3)
Chloride: 108 mmol/L (ref 98–110)
Creat: 1.05 mg/dL (ref 0.70–1.25)
GFR, Est African American: 87 mL/min/{1.73_m2} (ref >=60)
GFR, Est Non African American: 75 mL/min/{1.73_m2} (ref >=60)
Globulin: 2.2 g/dL (calc) (ref 1.9–3.7)
Glucose, Bld: 99 mg/dL (ref 65–99)
Potassium: 4.4 mmol/L (ref 3.5–5.3)
Sodium: 139 mmol/L (ref 135–146)
Total Bilirubin: 0.4 mg/dL (ref 0.2–1.2)
Total Protein: 6 g/dL — ABNORMAL LOW (ref 6.1–8.1)

## 2020-04-17 LAB — LIPID PANEL
Cholesterol: 187 mg/dL (ref <200)
HDL: 34 mg/dL — ABNORMAL LOW (ref >=40)
LDL Cholesterol (Calc): 116 mg/dL (calc) — ABNORMAL HIGH
Non-HDL Cholesterol (Calc): 153 mg/dL (calc) — ABNORMAL HIGH (ref <130)
Total CHOL/HDL Ratio: 5.5 (calc) — ABNORMAL HIGH (ref <5.0)
Triglycerides: 243 mg/dL — ABNORMAL HIGH (ref <150)

## 2020-04-17 LAB — PSA: PSA: 1.82 ng/mL (ref <=4.0)

## 2020-04-17 MED ORDER — ROSUVASTATIN CALCIUM 10 MG PO TABS: ORAL_TABLET | ORAL | 1 refills | Status: DC

## 2020-04-17 NOTE — Progress Notes (Signed)
========================================================== -   Test results slightly outside the reference range are not unusual. If there is anything important, I will review this with you,  otherwise it is considered normal test values.  If you have further questions,  please do not hesitate to contact me at the office or via My Chart.  ==========================================================  -  Total Chol = 187 is " OK "  - But the Bad / Dangerous LDL Chol =m 116 is too high  (Ideal or Goal is less than 70)   - So  - Recommend restart   - Rosuvastatin   - 90 day supply is $11.96 with GoodRx at [facility]  23 at [address] [facility] 47 at [address] [facility] at [facility] And                          $ 60 at [facility]  Please let me know which pharmacy to send the RX   Also, please call office to schedule an Office Visit  in  3-4  months to recheck labs ==========================================================  -  Also   Triglycerides (   243   ) or fats in blood are too high  (goal is less than 150)    - Recommend avoid fried & greasy foods,  sweets / candy,   - Avoid white rice  (brown or wild rice or Quinoa is OK),   - Avoid white potatoes  (sweet potatoes are OK)   - Avoid anything made from white flour  - bagels, doughnuts, rolls, buns, biscuits, white and   wheat breads, pizza crust and traditional  pasta made of white flour & egg white  - (vegetarian pasta or spinach or wheat pasta is OK).    - Multi-grain bread is OK - like multi-grain flat bread or  sandwich thins.   - Avoid alcohol in excess.   - Exercise is also important. ==========================================================  -  PSA is Low - Great ! ==========================================================  -  All Else - Kidneys - Electrolytes - Liver   - all  Normal /  OK ====================================================

## 2020-04-30 ENCOUNTER — Other Ambulatory Visit: Payer: Self-pay | Admitting: Adult Health Nurse Practitioner

## 2020-04-30 DIAGNOSIS — K5150 Left sided colitis without complications: Secondary | ICD-10-CM

## 2020-04-30 MED ORDER — MESALAMINE 1.2 G PO TBEC: DELAYED_RELEASE_TABLET | ORAL | 1 refills | Status: DC

## 2020-06-11 ENCOUNTER — Other Ambulatory Visit: Payer: Self-pay | Admitting: Internal Medicine

## 2020-06-11 DIAGNOSIS — K625 Hemorrhage of anus and rectum: Secondary | ICD-10-CM

## 2020-06-11 DIAGNOSIS — K529 Noninfective gastroenteritis and colitis, unspecified: Secondary | ICD-10-CM

## 2020-06-11 DIAGNOSIS — K5150 Left sided colitis without complications: Secondary | ICD-10-CM

## 2020-06-25 ENCOUNTER — Encounter: Payer: Self-pay | Admitting: Physician Assistant

## 2020-06-25 ENCOUNTER — Other Ambulatory Visit: Payer: Self-pay

## 2020-06-25 ENCOUNTER — Ambulatory Visit: Payer: Self-pay | Admitting: Physician Assistant

## 2020-06-25 DIAGNOSIS — K921 Melena: Secondary | ICD-10-CM

## 2020-06-25 DIAGNOSIS — R197 Diarrhea, unspecified: Secondary | ICD-10-CM

## 2020-06-25 DIAGNOSIS — R140 Abdominal distension (gaseous): Secondary | ICD-10-CM

## 2020-06-25 DIAGNOSIS — Z8719 Personal history of other diseases of the digestive system: Secondary | ICD-10-CM

## 2020-06-25 NOTE — Patient Instructions (Addendum)
If you are age 63 or younger, your body mass index should be between 19-25. Your Body mass index is 26.58 kg/m. If this is out of the aformentioned range listed, please consider follow up with your Primary Care Provider.   Your provider has requested that you go to the basement level for lab work before leaving today. Press "B" on the elevator. The lab is located at the first door on the left as you exit the elevator.  You have been scheduled for a colonoscopy. Please follow written instructions given to you at your visit today.  Please pick up your prep supplies at the pharmacy within the next 1-3 days. If you use inhalers (even only as needed), please bring them with you on the day of your procedure.  Due to recent changes in healthcare laws, you may see the results of your imaging and laboratory studies on MyChart before your provider has had a chance to review them.  We understand that in some cases there may be results that are confusing or concerning to you. Not all laboratory results come back in the same time frame and the provider may be waiting for multiple results in order to interpret others.  Please give [place] 48 hours in order for your provider to thoroughly review all the results before contacting the office for clarification of your results.   Continue: Lialda 1.2 grams take 2 tablets everyday for now.  Thank you for entrusting me with your care and choosing [facility].  Dr Ardis Hughs

## 2020-06-25 NOTE — Progress Notes (Signed)
Chief Complaint: Hematochezia, bloating, diarrhea, history of UC  HPI:    Mr. Meador is a 64 year old male with a past medical history as listed below including left-sided ulcerative colitis, previously known to Dr. Maurene Capes, who was referred to me by Unk Pinto, MD for a complaint of hematochezia, bloating, diarrhea and a history of UC.      04/29/2012 colonoscopy done for history of left-sided segmental colitis found in twenty ten, that showed internal hemorrhoids and otherwise normal.  Biopsies revealed mild chronic inflammation left side of the colon.  He was restarted on Azulfidine 500 mg two p.o. daily.    Today, the patient tells me that he has been off of his ulcerative colitis meds for the past 4 to 5 years and has had no problems but over the past 3 months or so he has noticed bright red blood with every bowel movement which is typically loose 6-7 times a day as well as an increase in bloating and burping.  Tells me the same symptoms he had prior to being diagnosed initially.  His PCP started him on Lialda two tabs daily which he has been on for the past month or so but he does not feel like it is really helping.     Denies fever, chills, weight loss or symptoms that awaken him from sleep.  Past Medical History:  Diagnosis Date  . Anemia   . Hyperlipidemia   . Internal hemorrhoids   . Ulcerative colitis, left sided ([place])   . Vitamin D deficiency     History reviewed. No pertinent surgical history.  Current Outpatient Medications  Medication Sig Dispense Refill  . amoxicillin-clavulanate (AUGMENTIN) 875-125 MG tablet Take     1 tablet     2 x day     with food for sinus and dental infection 28 tablet 0  . aspirin 81 MG tablet Take 81 mg by mouth daily.    . Cholecalciferol (VITAMIN D PO) Take 6,000 Int'l Units by mouth daily.    . mesalamine (LIALDA) 1.2 g EC tablet 1 to 2 tabs daily for colitis 60 tablet 1  . Multiple Vitamins-Minerals (MULTIVITAMIN PO) Take by mouth. Take 1  tab daily    . OVER THE COUNTER MEDICATION OTC Krill oil 1 capsule daily.    Marland Kitchen OVER THE COUNTER MEDICATION CoQ10 1 capsule daily.    Marland Kitchen OVER THE COUNTER MEDICATION Flaxseed oil 1 capsule daily.    . Probiotic Product (PROBIOTIC PO) Take 1 capsule by mouth daily.    . rosuvastatin (CRESTOR) 10 MG tablet Take    1 tablet    Daily    for Cholesterol 90 tablet 1   No current facility-administered medications for this visit.    Allergies as of 06/25/2020  . (No Known Allergies)    Family History  Problem Relation Age of Onset  . Coronary artery disease Father   . Lung cancer Mother   . Colon cancer Neg Hx     Social History   Socioeconomic History  . Marital status: Divorced    Spouse name: Not on file  . Number of children: 0  . Years of education: Not on file  . Highest education level: Not on file  Occupational History    Employer: [employer]  Tobacco Use  . Smoking status: Former Smoker    Quit date: 04/20/2003    Years since quitting: 17.1  . Smokeless tobacco: Never Used  Substance and Sexual Activity  . Alcohol  use: Yes    Alcohol/week: 7.0 standard drinks    Types: 7 Cans of beer per week    Comment: 2 drinks a day  . Drug use: No  . Sexual activity: Not on file  Other Topics Concern  . Not on file  Social History Narrative  . Not on file   Social Determinants of Health   Financial Resource Strain: Not on file  Food Insecurity: Not on file  Transportation Needs: Not on file  Physical Activity: Not on file  Stress: Not on file  Social Connections: Not on file  Intimate Partner Violence: Not on file    Review of Systems:    Constitutional: No weight loss, fever or chills Skin: No rash  Cardiovascular: No chest pain Respiratory: No SOB  Gastrointestinal: See HPI and otherwise negative Genitourinary: No dysuria  Neurological: No headache, dizziness or syncope Musculoskeletal: No new muscle or joint pain Hematologic: No bruising Psychiatric:  No history of depression or anxiety   Physical Exam:  Vital signs: BP 120/78   Pulse 64   Ht 5\' 9"  (1.753 m)   Wt 180 lb (81.6 kg)   BMI 26.58 kg/m   Constitutional:   Pleasant Caucasian male appears to be in NAD, Well developed, Well nourished, alert and cooperative Head:  Normocephalic and atraumatic. Eyes:   PEERL, EOMI. No icterus. Conjunctiva pink. Ears:  Normal auditory acuity. Neck:  Supple Throat: Oral cavity and pharynx without inflammation, swelling or lesion.  Respiratory: Respirations even and unlabored. Lungs clear to auscultation bilaterally.   No wheezes, crackles, or rhonchi.  Cardiovascular: Normal S1, S2. No MRG. Regular rate and rhythm. No peripheral edema, cyanosis or pallor.  Gastrointestinal:  Soft, nondistended, nontender. No rebound or guarding. Normal bowel sounds. No appreciable masses or hepatomegaly. Rectal:  Not performed.  Msk:  Symmetrical without gross deformities. Without edema, no deformity or joint abnormality.  Neurologic:  Alert and  oriented x4;  grossly normal neurologically.  Skin:   Dry and intact without significant lesions or rashes. Psychiatric: Demonstrates good judgement and reason without abnormal affect or behaviors.  RELEVANT LABS AND IMAGING: CBC    Component Value Date/Time   WBC 5.4 01/02/2016 1621   RBC 4.26 01/02/2016 1621   HGB 13.1 (L) 01/02/2016 1621   HCT 39.1 01/02/2016 1621   PLT 293 01/02/2016 1621   MCV 91.8 01/02/2016 1621   MCH 30.8 01/02/2016 1621   MCHC 33.5 01/02/2016 1621   RDW 13.7 01/02/2016 1621   LYMPHSABS 1,944 01/02/2016 1621   MONOABS 486 01/02/2016 1621   EOSABS 108 01/02/2016 1621   BASOSABS 0 01/02/2016 1621    CMP     Component Value Date/Time   NA 139 04/16/2020 1433   K 4.4 04/16/2020 1433   CL 108 04/16/2020 1433   CO2 25 04/16/2020 1433   GLUCOSE 99 04/16/2020 1433   BUN 18 04/16/2020 1433   CREATININE 1.05 04/16/2020 1433   CALCIUM 8.5 (L) 04/16/2020 1433   PROT 6.0 (L)  04/16/2020 1433   ALBUMIN 3.9 03/27/2014 1613   AST 14 04/16/2020 1433   ALT 15 04/16/2020 1433   ALKPHOS 72 03/27/2014 1613   BILITOT 0.4 04/16/2020 1433   GFRNONAA 75 04/16/2020 1433   GFRAA 87 04/16/2020 1433    Assessment: 1.  Hematochezia: With below 2.  Bloating 3.  Diarrhea: With history of ulcerative colitis, will need to rule out infectious cause 4.  History of left-sided ulcerative colitis: Last colonoscopy in twenty thirteen, biopsy  still positive for colitis, has not been on medicine for the past 4 to 5 years, Lialda not helping over the past month; likely symptoms above are due to flare  Plan: 1.  Continue Lialda two tabs daily for now.  This may need to be increased pending results from colonoscopy. 2.  Ordered a GI pathogen panel to rule out infectious cause of diarrhea before proceeding with colonoscopy. 3.  Scheduled patient for a colonoscopy in the [place] with Dr. Tarri Glenn.  Patient was given a handout regarding detailed risk for the procedure and he agrees to proceed.  He will be Covid tested 2 days prior to time procedure. 4.  Discussed the patient that he may require steroids for his flare.  Can consider increasing his Lialda to four tabs after results from pathogen panel. 5.  Patient to follow in clinic per recommendations from Dr. Tarri Glenn after time of procedure  Ellouise Newer, PA-C [facility] 06/25/2020, 11:36 AM  Cc: Unk Pinto, MD

## 2020-06-25 NOTE — Progress Notes (Addendum)
Reviewed and agree with management plans with the additional information:  Prior endoscopic history with Dr. Olevia Perches: Colonoscopy 02/02/2005 for hematochezia: 10 mm hyperplastic rectal polyp Colonoscopy 02/11/2009: colitis from 25 to 45 cm with rectal sparing. Biopsies most consistent with UC. Rectal biopsies not obtained.  Started on sulfasalazine. Colonoscopy 04/29/2012: Internal hemorrhoids.  Right sided colon biopsies were normal.  Left sided colon biopsies from 30 to 50 cm showed mild chronic active inflammation with mild cryptitis but no granulomata.  Rectal biopsies were normal.  Need to clarify if there have been extra-GI manifestations of IBD.   Given the location of his endoscopic findings and the associated histology, must consider segmental colitis associated with diverticular disease in addition to traditional chronic inflammatory bowel disease.   Recommend ESR, CRP, CBC, and fecal calprotectin in addition to GI pathogen panel prior to colonoscopy. Last CBC that I can see in EPIC is from 2017.  Amaris Delafuente L. Tarri Glenn, MD, MPH

## 2020-06-26 ENCOUNTER — Other Ambulatory Visit (INDEPENDENT_AMBULATORY_CARE_PROVIDER_SITE_OTHER): Payer: Self-pay

## 2020-06-26 ENCOUNTER — Telehealth: Payer: Self-pay

## 2020-06-26 DIAGNOSIS — R140 Abdominal distension (gaseous): Secondary | ICD-10-CM

## 2020-06-26 DIAGNOSIS — R197 Diarrhea, unspecified: Secondary | ICD-10-CM

## 2020-06-26 DIAGNOSIS — Z8719 Personal history of other diseases of the digestive system: Secondary | ICD-10-CM

## 2020-06-26 DIAGNOSIS — K921 Melena: Secondary | ICD-10-CM

## 2020-06-26 LAB — CBC WITH DIFFERENTIAL/PLATELET
Basophils Absolute: 0.1 10*3/uL (ref 0.0–0.1)
Basophils Relative: 0.8 % (ref 0.0–3.0)
Eosinophils Absolute: 0.8 10*3/uL — ABNORMAL HIGH (ref 0.0–0.7)
Eosinophils Relative: 11.1 % — ABNORMAL HIGH (ref 0.0–5.0)
HCT: 36.9 % — ABNORMAL LOW (ref 39.0–52.0)
Hemoglobin: 12.3 g/dL — ABNORMAL LOW (ref 13.0–17.0)
Lymphocytes Relative: 24.5 % (ref 12.0–46.0)
Lymphs Abs: 1.8 10*3/uL (ref 0.7–4.0)
MCHC: 33.3 g/dL (ref 30.0–36.0)
MCV: 92.7 fl (ref 78.0–100.0)
Monocytes Absolute: 0.8 10*3/uL (ref 0.1–1.0)
Monocytes Relative: 10.5 % (ref 3.0–12.0)
Neutro Abs: 3.8 10*3/uL (ref 1.4–7.7)
Neutrophils Relative %: 53.1 % (ref 43.0–77.0)
Platelets: 315 10*3/uL (ref 150.0–400.0)
RBC: 3.98 Mil/uL — ABNORMAL LOW (ref 4.22–5.81)
RDW: 13.3 % (ref 11.5–15.5)
WBC: 7.2 10*3/uL (ref 4.0–10.5)

## 2020-06-26 LAB — C-REACTIVE PROTEIN: CRP: 1 mg/dL (ref 0.5–20.0)

## 2020-06-26 LAB — SEDIMENTATION RATE: Sed Rate: 21 mm/hr — ABNORMAL HIGH (ref 0–20)

## 2020-06-26 NOTE — Telephone Encounter (Signed)
Spoke with patient, he is aware that he will need to come back in for additional lab work and another stool study prior to his colonoscopy. Patient is aware that he no appt is necessary and he can stop by at his convenience between 7:30 AM - 5 PM, Monday through Friday. Patient verbalized understanding of all information and had no other concerns at the end of the call.

## 2020-06-26 NOTE — Telephone Encounter (Signed)
-----   Message from Levin Erp, [place] sent at 06/25/2020  1:28 PM EST ----- Regarding: See addendum See addendum by Dr. Tarri Glenn to my last note and please add additional bloodwork as she requests.  Thanks-JLL ----- Message ----- From: Thornton Park, MD Sent: 06/25/2020  12:26 PM EST To: Levin Erp, PA    ----- Message ----- From: Levin Erp, [place] Sent: 06/25/2020  11:49 AM EST To: Thornton Park, MD

## 2020-06-26 NOTE — Telephone Encounter (Signed)
Recommend ESR, CRP, CBC, and fecal calprotectin in addition to GI pathogen panel prior to colonoscopy. Last CBC that I can see in EPIC is from 2017.  Kimberly L. Beavers, MD, MPH   - Lab orders in epic. - Lm on vm for patient to return call. 

## 2020-06-27 ENCOUNTER — Other Ambulatory Visit: Payer: Self-pay

## 2020-06-27 DIAGNOSIS — R197 Diarrhea, unspecified: Secondary | ICD-10-CM

## 2020-06-27 DIAGNOSIS — Z8719 Personal history of other diseases of the digestive system: Secondary | ICD-10-CM

## 2020-06-27 DIAGNOSIS — K921 Melena: Secondary | ICD-10-CM

## 2020-06-27 DIAGNOSIS — R140 Abdominal distension (gaseous): Secondary | ICD-10-CM

## 2020-06-28 ENCOUNTER — Other Ambulatory Visit: Payer: Self-pay | Admitting: Internal Medicine

## 2020-06-28 MED ORDER — AMOXICILLIN-POT CLAVULANATE 875-125 MG PO TABS: ORAL_TABLET | ORAL | 0 refills | Status: DC

## 2020-07-01 ENCOUNTER — Other Ambulatory Visit: Payer: Self-pay

## 2020-07-01 DIAGNOSIS — Z8719 Personal history of other diseases of the digestive system: Secondary | ICD-10-CM

## 2020-07-01 DIAGNOSIS — K921 Melena: Secondary | ICD-10-CM

## 2020-07-01 DIAGNOSIS — R140 Abdominal distension (gaseous): Secondary | ICD-10-CM

## 2020-07-01 DIAGNOSIS — R197 Diarrhea, unspecified: Secondary | ICD-10-CM

## 2020-07-01 LAB — GI PROFILE, STOOL, PCR

## 2020-07-03 LAB — CALPROTECTIN, FECAL: Calprotectin, Fecal: 508 ug/g — ABNORMAL HIGH (ref 0–120)

## 2020-07-18 ENCOUNTER — Other Ambulatory Visit: Payer: Self-pay | Admitting: Gastroenterology

## 2020-07-18 LAB — SARS CORONAVIRUS 2 (TAT 6-24 HRS): SARS Coronavirus 2: NEGATIVE

## 2020-07-21 ENCOUNTER — Encounter: Payer: Self-pay | Admitting: Certified Registered Nurse Anesthetist

## 2020-07-22 ENCOUNTER — Encounter: Payer: Self-pay | Admitting: Gastroenterology

## 2020-07-22 ENCOUNTER — Ambulatory Visit (AMBULATORY_SURGERY_CENTER): Payer: Self-pay | Admitting: Gastroenterology

## 2020-07-22 ENCOUNTER — Other Ambulatory Visit: Payer: Self-pay | Admitting: Adult Health Nurse Practitioner

## 2020-07-22 ENCOUNTER — Other Ambulatory Visit: Payer: Self-pay

## 2020-07-22 VITALS — BP 98/64 | HR 60 | Temp 98.0°F | Resp 12 | Ht 69.0 in | Wt 180.0 lb

## 2020-07-22 DIAGNOSIS — K5150 Left sided colitis without complications: Secondary | ICD-10-CM

## 2020-07-22 DIAGNOSIS — Z8719 Personal history of other diseases of the digestive system: Secondary | ICD-10-CM

## 2020-07-22 DIAGNOSIS — K529 Noninfective gastroenteritis and colitis, unspecified: Secondary | ICD-10-CM

## 2020-07-22 DIAGNOSIS — K51011 Ulcerative (chronic) pancolitis with rectal bleeding: Secondary | ICD-10-CM

## 2020-07-22 MED ORDER — ROWASA 4 G RE KIT: 4.0000 g | PACK | Freq: Two times a day (BID) | RECTAL | 1 refills | Status: DC

## 2020-07-22 MED ORDER — SODIUM CHLORIDE 0.9 % IV SOLN: 500.0000 mL | Freq: Once | INTRAVENOUS | Status: DC

## 2020-07-22 NOTE — Progress Notes (Signed)
Report given to PACU, vss 

## 2020-07-22 NOTE — Patient Instructions (Signed)
YOU HAD AN ENDOSCOPIC PROCEDURE TODAY AT THE [facility]:   Refer to the procedure report that was given to you for any specific questions about what was found during the examination.  If the procedure report does not answer your questions, please call your gastroenterologist to clarify.  If you requested that your care partner not be given the details of your procedure findings, then the procedure report has been included in a sealed envelope for you to review at your convenience later.  YOU SHOULD EXPECT: Some feelings of bloating in the abdomen. Passage of more gas than usual.  Walking can help get rid of the air that was put into your GI tract during the procedure and reduce the bloating. If you had a lower endoscopy (such as a colonoscopy or flexible sigmoidoscopy) you may notice spotting of blood in your stool or on the toilet paper. If you underwent a bowel prep for your procedure, you may not have a normal bowel movement for a few days.  Please Note:  You might notice some irritation and congestion in your nose or some drainage.  This is from the oxygen used during your procedure.  There is no need for concern and it should clear up in a day or so.  SYMPTOMS TO REPORT IMMEDIATELY:   Following lower endoscopy (colonoscopy or flexible sigmoidoscopy):  Excessive amounts of blood in the stool  Significant tenderness or worsening of abdominal pains  Swelling of the abdomen that is new, acute  Fever of 100F or higher   Following upper endoscopy (EGD)  Vomiting of blood or coffee ground material  New chest pain or pain under the shoulder blades  Painful or persistently difficult swallowing  New shortness of breath  Fever of 100F or higher  Black, tarry-looking stools  For urgent or emergent issues, a gastroenterologist can be reached at any hour by calling [phone]. Do not use MyChart messaging for urgent concerns.    DIET:  We do recommend a small meal at first, but  then you may proceed to your regular diet.  Drink plenty of fluids but you should avoid alcoholic beverages for 24 hours.  ACTIVITY:  You should plan to take it easy for the rest of today and you should NOT DRIVE or use heavy machinery until tomorrow (because of the sedation medicines used during the test).    FOLLOW UP: Our staff will call the number listed on your records 48-72 hours following your procedure to check on you and address any questions or concerns that you may have regarding the information given to you following your procedure. If we do not reach you, we will leave a message.  We will attempt to reach you two times.  During this call, we will ask if you have developed any symptoms of COVID 19. If you develop any symptoms (ie: fever, flu-like symptoms, shortness of breath, cough etc.) before then, please call [phone].  If you test positive for Covid 19 in the 2 weeks post procedure, please call and report this information to us.    If any biopsies were taken you will be contacted by phone or by letter within the next 1-3 weeks.  Please call us at [phone] if you have not heard about the biopsies in 3 weeks.    SIGNATURES/CONFIDENTIALITY: You and/or your care partner have signed paperwork which will be entered into your electronic medical record.  These signatures attest to the fact that that the information above on   your After Visit Summary has been reviewed and is understood.  Full responsibility of the confidentiality of this discharge information lies with you and/or your care-partner. 

## 2020-07-22 NOTE — Progress Notes (Signed)
Called to room to assist during endoscopic procedure.  Patient ID and intended procedure confirmed with present staff. Received instructions for my participation in the procedure from the performing physician.  

## 2020-07-22 NOTE — Op Note (Signed)
[name] Patient Name: Jeffrey Gallagher Procedure Date: 07/22/2020 8:35 AM MRN: [id number] Endoscopist: Thornton Park MD, MD Age: 65 Referring MD:  Date of Birth: May 06, 1956 Gender: Male Account #: [id number] Procedure:                Colonoscopy Indications:              Chronic diarrhea, Rectal bleeding                           History of left-sided segemtal colitis in 2013                            treated with Azulfidine                           Had been using Lialda 2.4g daily without                            significant improvement in symptoms, stopped 2                            weeks prior to endoscopy Medicines:                Monitored Anesthesia Care Procedure:                Pre-Anesthesia Assessment:                           - Prior to the procedure, a History and Physical                            was performed, and patient medications and                            allergies were reviewed. The patient's tolerance of                            previous anesthesia was also reviewed. The risks                            and benefits of the procedure and the sedation                            options and risks were discussed with the patient.                            All questions were answered, and informed consent                            was obtained. Prior Anticoagulants: The patient has                            taken no previous anticoagulant or antiplatelet                            agents.  ASA Grade Assessment: II - A patient with                            mild systemic disease. After reviewing the risks                            and benefits, the patient was deemed in                            satisfactory condition to undergo the procedure.                           After obtaining informed consent, the colonoscope                            was passed under direct vision. Throughout the                            procedure, the  patient's blood pressure, pulse, and                            oxygen saturations were monitored continuously. The                            Olympus CF-HQ190L ([id number]) Colonoscope was                            introduced through the anus and advanced to the 10                            cm into the ileum. The colonoscopy was performed                            without difficulty. The patient tolerated the                            procedure well. The quality of the bowel                            preparation was good. The terminal ileum, ileocecal                            valve, appendiceal orifice, and rectum were                            photographed. Scope In: 8:48:10 AM Scope Out: 9:01:30 AM Scope Withdrawal Time: 0 hours 10 minutes 52 seconds  Total Procedure Duration: 0 hours 13 minutes 20 seconds  Findings:                 The perianal and digital rectal examinations were                            normal.  A continuous area of nonbleeding ulcerated mucosa                            with stigmata of recent bleeding was present from                            the ansu to 30 cm proximal to the anus. Biopsies                            were taken with a cold forceps for histology.                            Estimated blood loss was minimal.                           The remainder of the examined colonic mucosa                            appeared normal. Biopsies were taken with a cold                            forceps for histology. Estimated blood loss was                            minimal.                           The exam was otherwise without abnormality on                            direct and retroflexion views. Complications:            No immediate complications. Estimated blood loss:                            Minimal. Estimated Blood Loss:     Estimated blood loss was minimal. Impression:               - Rectosigmoid colitis -  endoscopic appearance                            consistent with ulcerative colitis. Biopsied.                           - The entire examined colon is normal. Biopsied.                           - The examination was otherwise normal on direct                            and retroflexion views. Recommendation:           - Patient has a contact number available for                            emergencies. The signs and  symptoms of potential                            delayed complications were discussed with the                            patient. Return to normal activities tomorrow.                            Written discharge instructions were provided to the                            patient.                           - Resume previous diet.                           - Continue present medications.                           - Resume Lialda 4.8 g daily.                           - Start Rowasa 4g enemas PR BID x 2 weeks, then                            reduce to once daily.                           - Await pathology results.                           - No aspirin, ibuprofen, naproxen, or other                            non-steroidal anti-inflammatory drugs.                           - Office follow-up with J Lemmon or K Ercell Perlman in 4                            weeks, earlier if needed. Thornton Park MD, MD 07/22/2020 9:12:04 AM This report has been signed electronically.

## 2020-07-24 ENCOUNTER — Telehealth: Payer: Self-pay

## 2020-07-24 NOTE — Telephone Encounter (Signed)
  Follow up Call-  Call back number 07/22/2020  Post procedure Call Back phone  # [phone]  Permission to leave phone message Yes  Some recent data might be hidden     Patient questions:  Do you have a fever, pain , or abdominal swelling? No. Pain Score  0 *  Have you tolerated food without any problems? Yes.    Have you been able to return to your normal activities? Yes.    Do you have any questions about your discharge instructions: Diet   No. Medications  No. Follow up visit  No.  Do you have questions or concerns about your Care? No.  Actions: * If pain score is 4 or above: No action needed, pain <4.  1. Have you developed a fever since your procedure? no  2.   Have you had an respiratory symptoms (SOB or cough) since your procedure? no  3.   Have you tested positive for COVID 19 since your procedure no  4.   Have you had any family members/close contacts diagnosed with the COVID 19 since your procedure?  no   If yes to any of these questions please route to Joylene Aimee, RN and Joella Prince, RN

## 2020-07-31 ENCOUNTER — Other Ambulatory Visit: Payer: Self-pay

## 2020-07-31 ENCOUNTER — Telehealth: Payer: Self-pay | Admitting: Gastroenterology

## 2020-07-31 DIAGNOSIS — A048 Other specified bacterial intestinal infections: Secondary | ICD-10-CM

## 2020-07-31 MED ORDER — MESALAMINE 4 G RE ENEM: 4.0000 g | ENEMA | Freq: Two times a day (BID) | RECTAL | 0 refills | Status: DC

## 2020-07-31 MED ORDER — PYLERA 140-125-125 MG PO CAPS: 3.0000 | ORAL_CAPSULE | Freq: Three times a day (TID) | ORAL | 0 refills | Status: DC

## 2020-07-31 NOTE — Telephone Encounter (Signed)
Patient is returning your call regarding path results. ?

## 2020-08-01 ENCOUNTER — Other Ambulatory Visit: Payer: Self-pay

## 2020-08-21 ENCOUNTER — Ambulatory Visit: Payer: Self-pay | Admitting: Gastroenterology

## 2020-08-21 ENCOUNTER — Encounter: Payer: Self-pay | Admitting: Gastroenterology

## 2020-08-21 ENCOUNTER — Other Ambulatory Visit (INDEPENDENT_AMBULATORY_CARE_PROVIDER_SITE_OTHER): Payer: Self-pay

## 2020-08-21 VITALS — BP 108/70 | HR 75 | Ht 69.0 in | Wt 179.0 lb

## 2020-08-21 DIAGNOSIS — K5150 Left sided colitis without complications: Secondary | ICD-10-CM

## 2020-08-21 LAB — FERRITIN: Ferritin: 34.5 ng/mL (ref 22.0–322.0)

## 2020-08-21 LAB — CBC
HCT: 36.4 % — ABNORMAL LOW (ref 39.0–52.0)
Hemoglobin: 12.2 g/dL — ABNORMAL LOW (ref 13.0–17.0)
MCHC: 33.5 g/dL (ref 30.0–36.0)
MCV: 90 fl (ref 78.0–100.0)
Platelets: 321 10*3/uL (ref 150.0–400.0)
RBC: 4.05 Mil/uL — ABNORMAL LOW (ref 4.22–5.81)
RDW: 13.7 % (ref 11.5–15.5)
WBC: 7 10*3/uL (ref 4.0–10.5)

## 2020-08-21 LAB — IRON: Iron: 53 ug/dL (ref 42–165)

## 2020-08-21 MED ORDER — MESALAMINE 1.2 G PO TBEC: 4.8000 g | DELAYED_RELEASE_TABLET | Freq: Every day | ORAL | 2 refills | Status: DC

## 2020-08-21 NOTE — Patient Instructions (Addendum)
RECOMMENDATION(S):   I have recommended labs today to look for iron deficiency and to monitor inflammation in your colon.   LABS:   . Your provider has requested that you go to the basement level for lab work before leaving today. Press "B" on the elevator. The lab is located at the first door on the left as you exit the elevator. . Please be aware that you will need to REPEAT your Fecal Calprotectin in week from today  [place]:   . Due to recent changes in healthcare laws, you may see the results of your imaging and laboratory studies on MyChart before your provider has had a chance to review them.  We understand that in some cases there may be results that are confusing or concerning to you. Not all laboratory results come back in the same time frame and the provider may be waiting for multiple results in order to interpret others.  Please give [place] 48 hours in order for your provider to thoroughly review all the results before contacting the office for clarification of your results.   I recommend that you use sunscreen and sun-protective clothing whenever you are outside.  I recommend that seasonal flu vaccine and the Covid vaccine.   Avoid all non-inflammatory medications such as ibuprofen, Motrin, Aleve, Naproxen, Goodys, and BC as they may trigger a flare.   Keeping your bones healthy is important. I recommend that you take daily calcium and Vitamin D supplements. Weight bearing exercise is also good for your bones.   I recommend that you go to the [place] website for additional information about achieving good health.   PRESCRIPTION MEDICATION(S):   We have sent the following medication(s) to your pharmacy:  . Doristine Johns - please take 4.8g daily  NOTE: If your medication(s) requires a PRIOR AUTHORIZATION, we will receive notification from your pharmacy. Once received, the process to submit for approval may take up to 7-10  business days. You will be contacted about any denials we have received from your insurance company as well as alternatives recommended by your provider.  FOLLOW UP:  Please plan to follow up with me in 6 months. I am happy to see you sooner if needed.  BMI:  If you are age 45 or younger, your body mass index should be between 19-25. Your There is no height or weight on file to calculate BMI. If this is out of the aformentioned range listed, please consider follow up with your Primary Care Provider.   Thank you for trusting me with your gastrointestinal care!    Thornton Park, MD, MPH

## 2020-08-21 NOTE — Progress Notes (Signed)
Referring Provider: Unk Pinto, MD Primary Care Physician:  Unk Pinto, MD  Chief complaint:  Ulcerative colitis   IMPRESSION:  Left-sided ulcerative colitis    - initially diagnosed 2013    - symptoms now improved on Lialda 4.8g daily    - fecal calprotectin 508 prior to increasing daily dose Normocytic anemia - ? Iron deficiency History of hyperplastic rectal polyp 2006 No known family history of colon cancer or polyps   PLAN: Continue Lialda 4.8 g daily Fecal calprotectin now and one week before his next visit Iron, ferritin, CBC Discussed health promotion/disease prevention in the setting of UC Follow-up in 6 months, earlier as needed  Please see the "Patient Instructions" section for addition details about the plan.  HPI: Jeffrey Gallagher is a 65 y.o. male with a history of left-sided segemtal colitis in 2013 treated with Azulfidine. Resumed Lialda 2.4g daily after a 4-5 year hiatus without significant improvement in symptoms of bloody diarrhea up to 6-7 daily with bloating and gas. Colonoscopy 07/22/09 revealed moderately active colitis from the anal verge to 30 cm.   Labs 12/21: fecal calprotectin 508, negative GI pathogen panel, ESR 21, CRP 1, Hemoglobin 12.3, MCV 92.7, RDW 13.3, platelets 315  Symptoms have improved on Lialda 4.8 g daily and Rowasa 4g enemas PR BID x 2 weeks, then reduce to once daily. Having a formed BM 3-4 times daily. No blood or mucous. Rare urgency.   Good appetite. Weight stable. No extra-GI manifestations of IBD. No NSAIDs.   He feels that Dr. Melford Aase has been following his vitamin D levels. He sees him at least annually.   Prior endoscopic history with Dr. Olevia Perches: Colonoscopy 02/02/2005 for hematochezia: 10 mm hyperplastic rectal polyp Colonoscopy 02/11/2009: colitis from 25 to 45 cm with rectal sparing. Biopsies most consistent with UC. Rectal biopsies not obtained.  Started on sulfasalazine. Colonoscopy 04/29/2012: Internal  hemorrhoids.  Right sided colon biopsies were normal.  Left sided colon biopsies from 30 to 50 cm showed mild chronic active inflammation with mild cryptitis but no granulomata.  Rectal biopsies were normal.  No known family history of colon cancer or polyps. No family history of uterine/endometrial cancer, pancreatic cancer or gastric/stomach cancer.   Past Medical History:  Diagnosis Date  . Anemia   . Hyperlipidemia   . Internal hemorrhoids   . Ulcerative colitis, left sided ([place])   . Vitamin D deficiency     History reviewed. No pertinent surgical history.  Current Outpatient Medications  Medication Sig Dispense Refill  . aspirin 81 MG tablet Take 81 mg by mouth daily.    . Cholecalciferol (VITAMIN D PO) Take 6,000 Int'l Units by mouth daily.    . mesalamine (LIALDA) 1.2 g EC tablet Take        1 tablet  2 x /day       for Colitis 180 tablet 0  . mesalamine (ROWASA) 4 g enema Place 60 mLs (4 g total) rectally in the morning and at bedtime. 1680 mL 0  . Multiple Vitamins-Minerals (MULTIVITAMIN PO) Take by mouth. Take 1 tab daily    . OVER THE COUNTER MEDICATION OTC Krill oil 1 capsule daily.    Marland Kitchen OVER THE COUNTER MEDICATION Flaxseed oil 1 capsule daily.    . Probiotic Product (PROBIOTIC PO) Take 1 capsule by mouth daily.    . rosuvastatin (CRESTOR) 10 MG tablet Take    1 tablet    Daily    for Cholesterol 90 tablet 1  No current facility-administered medications for this visit.    Allergies as of 08/21/2020  . (No Known Allergies)    Family History  Problem Relation Age of Onset  . Coronary artery disease Father   . Lung cancer Mother   . Colon cancer Neg Hx   . Esophageal cancer Neg Hx   . Stomach cancer Neg Hx   . Rectal cancer Neg Hx     Social History   Socioeconomic History  . Marital status: Divorced    Spouse name: Not on file  . Number of children: 0  . Years of education: Not on file  . Highest education level: Not on file  Occupational History     Employer: [employer]  Tobacco Use  . Smoking status: Former Smoker    Quit date: 04/20/2003    Years since quitting: 17.3  . Smokeless tobacco: Never Used  Vaping Use  . Vaping Use: Never used  Substance and Sexual Activity  . Alcohol use: Yes    Alcohol/week: 7.0 standard drinks    Types: 7 Cans of beer per week    Comment: 2 drinks a day  . Drug use: No  . Sexual activity: Not on file  Other Topics Concern  . Not on file  Social History Narrative  . Not on file   Social Determinants of Health   Financial Resource Strain: Not on file  Food Insecurity: Not on file  Transportation Needs: Not on file  Physical Activity: Not on file  Stress: Not on file  Social Connections: Not on file  Intimate Partner Violence: Not on file    Review of Systems: 12 system ROS is negative except as noted above.   Physical Exam: General:   Alert,  well-nourished, pleasant and cooperative in NAD Head:  Normocephalic and atraumatic. Eyes:  Sclera clear, no icterus.   Conjunctiva pink. Ears:  Normal auditory acuity. Nose:  No deformity, discharge,  or lesions. Mouth:  No deformity or lesions.   Neck:  Supple; no masses or thyromegaly. Lungs:  Clear throughout to auscultation.   No wheezes. Heart:  Regular rate and rhythm; no murmurs. Abdomen:  Soft,nontender, nondistended, normal bowel sounds, no rebound or guarding. No hepatosplenomegaly.   Rectal:  Deferred  Msk:  Symmetrical. No boney deformities LAD: No inguinal or umbilical LAD Extremities:  No clubbing or edema. Neurologic:  Alert and  oriented x4;  grossly nonfocal Skin:  Intact without significant lesions or rashes. Psych:  Alert and cooperative. Normal mood and affect.     Jeffrey Goin L. Tarri Glenn, MD, MPH 08/21/2020, 2:01 PM

## 2020-08-23 ENCOUNTER — Other Ambulatory Visit: Payer: Self-pay

## 2020-08-23 DIAGNOSIS — K5150 Left sided colitis without complications: Secondary | ICD-10-CM

## 2020-08-25 ENCOUNTER — Encounter: Payer: Self-pay | Admitting: Gastroenterology

## 2020-08-27 ENCOUNTER — Other Ambulatory Visit: Payer: Self-pay

## 2020-08-27 DIAGNOSIS — K5150 Left sided colitis without complications: Secondary | ICD-10-CM

## 2020-08-27 DIAGNOSIS — D509 Iron deficiency anemia, unspecified: Secondary | ICD-10-CM

## 2020-08-28 LAB — CALPROTECTIN, FECAL: Calprotectin, Fecal: 30 ug/g (ref 0–120)

## 2021-02-28 ENCOUNTER — Other Ambulatory Visit: Payer: Self-pay

## 2021-02-28 ENCOUNTER — Encounter: Payer: Self-pay | Admitting: Gastroenterology

## 2021-02-28 ENCOUNTER — Other Ambulatory Visit: Payer: Self-pay | Admitting: Gastroenterology

## 2021-02-28 ENCOUNTER — Ambulatory Visit: Payer: Self-pay | Admitting: Gastroenterology

## 2021-02-28 VITALS — BP 110/70 | HR 65 | Ht 69.0 in | Wt 170.0 lb

## 2021-02-28 DIAGNOSIS — K5150 Left sided colitis without complications: Secondary | ICD-10-CM

## 2021-02-28 MED ORDER — MESALAMINE 1.2 G PO TBEC: 4.8000 g | DELAYED_RELEASE_TABLET | Freq: Every day | ORAL | 3 refills | Status: DC

## 2021-02-28 NOTE — Patient Instructions (Addendum)
It was my pleasure to provide care to you today. Based on our discussion, I am providing you with my recommendations below:  RECOMMENDATION(S):   Continue Lialda We will plan to repeat your colonoscopy in 2023  LABS:   Please proceed to the basement level for lab work before leaving today. Press "B" on the elevator. The lab is located at the first door on the left as you exit the elevator.  HEALTHCARE LAWS AND MY CHART RESULTS:   Due to recent changes in healthcare laws, you may see results of your imaging and/or laboratory studies on MyChart before I have had a chance to review them.  I understand that in some cases there may be results that are confusing or concerning to you. Please understand that not all results are received at same time and often I may need to interpret multiple results in order to provide you with the best plan of care or course of treatment. Therefore, I ask that you please give me 48 hours to thoroughly review all your results before contacting my office for clarification.    FOLLOW UP:  I would like for you to follow up with me in 6-12 months. Please call the office at [phone] to schedule your appointment at your convenience.  BMI:  If you are age 10 or younger, your body mass index should be between 19-25. Your Body mass index is 25.1 kg/m. If this is out of the aformentioned range listed, please consider follow up with your Primary Care Provider.   MY CHART:  The [facility] providers would like to encourage you to use [facility] to communicate with providers for non-urgent requests or questions.  Due to long hold times on the telephone, sending your provider a message by [facility] may be a faster and more efficient way to get a response.  Please allow 48 business hours for a response.  Please remember that this is for non-urgent requests.   Thank you for trusting me with your gastrointestinal care!    Thornton Park, MD, MPH

## 2021-02-28 NOTE — Progress Notes (Signed)
Referring Provider: Unk Pinto, MD Primary Care Physician:  Unk Pinto, MD  Chief complaint:  Ulcerative colitis   IMPRESSION:  Left-sided ulcerative colitis    - initially diagnosed 2013    - symptoms now improved on Lialda 4.8g daily    - fecal calprotectin 508 prior to increasing daily dose    - fecal calprotectin 30 08/23/20 after resuming treatment Normocytic anemia - ? Iron deficiency    - Labs 08/21/20: hgb 12.2, platelets 321, ferritin 34.5, iron 53 History of hyperplastic rectal polyp 2006 No known family history of colon cancer or polyps   PLAN: Continue Lialda 4.8 g daily Fecal calprotectin now and one week before his next visit Iron studies at time of next visit as recent labs suggest borderline iron deficiency Discussed health promotion/disease prevention in the setting of UC Discussed colonoscopy next year to document histologic remission Follow-up in 6-12 months, earlier as needed   HPI: Jeffrey Gallagher is a 65 y.o. male with a history of left-sided segemtal colitis in 2013 treated with Azulfidine. Resumed Lialda 2.4g daily after a 4-5 year hiatus without significant improvement in symptoms of bloody diarrhea up to 6-7 daily with bloating and gas. Colonoscopy 07/22/20 revealed moderately active colitis from the anal verge to 30 cm.  Symptoms improved with Lialda 4.8 g daily and Rowasa 4g enemas PR BID x 2 weeks, then reduce to once daily. Having a formed BM 2-3 times daily. No blood or mucous. Rare urgency. He is satisfied with his results.  Good appetite. Weight down 9 pounds since he stopped going to [place] every morning. No extra-GI manifestations of IBD. No NSAIDs.   He feels that Dr. Melford Aase has been following his vitamin D levels. He sees him at least annually.   Labs 12/21: fecal calprotectin 508, negative GI pathogen panel, ESR 21, CRP 1, Hemoglobin 12.3, MCV 92.7, RDW 13.3, platelets 315 Labs 08/21/20: WBC 7, hgb 12.2, MCV 90, RDW 13.7, platelets  321, ferritin 34.5, iron 53  Endoscopic history: Colonoscopy 02/02/2005 for hematochezia: 10 mm hyperplastic rectal polyp Colonoscopy 02/11/2009: colitis from 25 to 45 cm with rectal sparing. Biopsies most consistent with UC. Rectal biopsies not obtained.  Started on sulfasalazine. Colonoscopy 04/29/2012: Internal hemorrhoids.  Right sided colon biopsies were normal.  Left sided colon biopsies from 30 to 50 cm showed mild chronic active inflammation with mild cryptitis but no granulomata.  Rectal biopsies were normal. Colonoscopy 07/22/20: moderately active colitis from the anal verge to 30 cm.   No known family history of colon cancer or polyps. No family history of uterine/endometrial cancer, pancreatic cancer or gastric/stomach cancer.   Past Medical History:  Diagnosis Date   Anemia    Hyperlipidemia    Internal hemorrhoids    Ulcerative colitis, left sided ([place])    Vitamin D deficiency     History reviewed. No pertinent surgical history.  Current Outpatient Medications  Medication Sig Dispense Refill   aspirin 81 MG tablet Take 81 mg by mouth daily.     Cholecalciferol (VITAMIN D PO) Take 6,000 Int'l Units by mouth daily.     mesalamine (LIALDA) 1.2 g EC tablet Take 4 tablets (4.8 g total) by mouth daily. 120 tablet 2   mesalamine (ROWASA) 4 g enema Place 60 mLs (4 g total) rectally in the morning and at bedtime. 1680 mL 0   Multiple Vitamins-Minerals (MULTIVITAMIN PO) Take by mouth. Take 1 tab daily     OVER THE COUNTER MEDICATION OTC Krill oil 1 capsule  daily.     OVER THE COUNTER MEDICATION Flaxseed oil 1 capsule daily.     Probiotic Product (PROBIOTIC PO) Take 1 capsule by mouth daily.     rosuvastatin (CRESTOR) 10 MG tablet Take    1 tablet    Daily    for Cholesterol 90 tablet 1   No current facility-administered medications for this visit.    Allergies as of 02/28/2021   (No Known Allergies)    Family History  Problem Relation Age of Onset   Coronary artery disease  Father    Lung cancer Mother    Colon cancer Neg Hx    Esophageal cancer Neg Hx    Stomach cancer Neg Hx    Rectal cancer Neg Hx      Physical Exam: General:   Alert,  well-nourished, pleasant and cooperative in NAD Head:  Normocephalic and atraumatic. Eyes:  Sclera clear, no icterus.   Conjunctiva pink. Abdomen:  Soft, nontender, nondistended, normal bowel sounds, no rebound or guarding. No hepatosplenomegaly.   Rectal:  Deferred  Msk:  Symmetrical. No boney deformities LAD: No inguinal or umbilical LAD Extremities:  No clubbing or edema. Neurologic:  Alert and  oriented x4;  grossly nonfocal Skin:  Intact without significant lesions or rashes. Psych:  Alert and cooperative. Normal mood and affect.     Kate Larock L. Tarri Glenn, MD, MPH 02/28/2021, 10:21 AM

## 2021-04-20 NOTE — Progress Notes (Signed)
Future Appointments  Date Time Provider [place]  04/21/2021  2:00 PM Unk Pinto, MD [facility] None  04/21/2022  2:00 PM Unk Pinto, MD [facility] None            This very nice 65 y.o. DWM presents for evaluation. Patient has been followed for labile HTN, HLD, Prediabetes and Vitamin D Deficiency. Hx/o Ulcerative Colitis ( 2010) on Lialda 4.8_gm /day followed by Dr Laurier Nancy        Patient has hx/o labile HTN (2007) monitored expectantly.  Patient's BP has been controlled at home.  Today's BP is at goal - 118/76. Patient denies any cardiac symptoms as chest pain, palpitations, shortness of breath, dizziness or ankle swelling.       Patient's hyperlipidemia is not controlled with diet and off Rosuvastatin. Patient denies myalgias or other medication SE's. Last lipids were not at goal:  Lab Results  Component Value Date   CHOL 187 04/16/2020   HDL 34 (L) 04/16/2020   LDLCALC 116 (H) 04/16/2020   TRIG 243 (H) 04/16/2020   CHOLHDL 5.5 (H) 04/16/2020         Patient is followed expectantly for glucose intolerance  and patient denies reactive hypoglycemic symptoms, visual blurring, diabetic polys or paresthesias. Last A1c was normal & at goal:   Lab Results  Component Value Date   HGBA1C 5.4 01/02/2016          Finally, patient has history of Vitamin D Deficiency ("41" on tx 2008) and last vitamin D was at goal:   Lab Results  Component Value Date   VD25OH 82 02/01/2017     Current Outpatient Medications on File Prior to Visit  Medication Sig   aspirin 81 MG tablet Take  daily.   VITAMIN D 6,000 Units  Take  daily.   mesalamine (LIALDA) 1.2 g EC tablet Take 4 tablets  daily.   Multiple Vitamins-Minerals  Take  1 tab daily   OTC Krill oil 1 capsule daily.   Flaxseed oil  1 capsule daily.   Probiotic  Take 1 capsule daily.    No Known Allergies   Past Medical History:  Diagnosis Date   Anemia    Hyperlipidemia    Internal hemorrhoids     Ulcerative colitis, left sided ([place])    Vitamin D deficiency      Health Maintenance  Topic Date Due   COVID-19 Vaccine (1) Never done   HIV Screening  Never done   Hepatitis C Screening  Never done   Zoster Vaccines- Shingrix (1 of 2) Never done   TETANUS/TDAP  07/14/2011   INFLUENZA VACCINE  Never done   COLONOSCOPY  07/23/2027   HPV VACCINES  Aged Out     Immunization History  Administered Date(s) Administered   Td 07/13/2001    Last Colon - 07/22/2020 - Dr Laurier Nancy -> Rectal Ulcerative Colitis    No past surgical history on file.   Family History  Problem Relation Age of Onset   Coronary artery disease Father    Lung cancer Mother    Colon cancer Neg Hx    Esophageal cancer Neg Hx    Stomach cancer Neg Hx    Rectal cancer Neg Hx      Social History   Tobacco Use   Smoking status: Former    Types: Cigarettes    Quit date: 04/20/2003    Years since quitting: 18.0   Smokeless tobacco: Never  Vaping Use   Vaping Use:  Never used  Substance Use Topics   Alcohol use: Yes    Alcohol/week: 7.0 standard drinks    Types: 7 Cans of beer per week    Comment: 2 drinks a day   Drug use: No      ROS Constitutional: Denies fever, chills, weight loss/gain, headaches, insomnia,  night sweats or change in appetite. Does c/o fatigue. Eyes: Denies redness, blurred vision, diplopia, discharge, itchy or watery eyes.  ENT: Denies discharge, congestion, post nasal drip, epistaxis, sore throat, earache, hearing loss, dental pain, Tinnitus, Vertigo, Sinus pain or snoring.  Cardio: Denies chest pain, palpitations, irregular heartbeat, syncope, dyspnea, diaphoresis, orthopnea, PND, claudication or edema Respiratory: denies cough, dyspnea, DOE, pleurisy, hoarseness, laryngitis or wheezing.  Gastrointestinal: Denies dysphagia, heartburn, reflux, water brash, pain, cramps, nausea, vomiting, bloating, diarrhea, constipation, hematemesis, melena, hematochezia, jaundice or  hemorrhoids Genitourinary: Denies dysuria, frequency, urgency, nocturia, hesitancy, discharge, hematuria or flank pain Musculoskeletal: Denies arthralgia, myalgia, stiffness, Jt. Swelling, pain, limp or strain/sprain. Denies Falls. Skin: Denies puritis, rash, hives, warts, acne, eczema or change in skin lesion Neuro: No weakness, tremor, incoordination, spasms, paresthesia or pain Psychiatric: Denies confusion, memory loss or sensory loss. Denies Depression. Endocrine: Denies change in weight, skin, hair change, nocturia, and paresthesia, diabetic polys, visual blurring or hyper / hypo glycemic episodes.  Heme/Lymph: No excessive bleeding, bruising or enlarged lymph nodes.   Physical Exam  BP 118/76   Pulse 65   Temp 97.7 F (36.5 C)   Resp 16   Ht 5\' 9"  (1.753 m)   Wt 169 lb 3.2 oz (76.7 kg)   SpO2 96%   BMI 24.99 kg/m   General Appearance: Well nourished and well groomed and in no apparent distress.  Eyes: PERRLA, EOMs, conjunctiva no swelling or erythema, normal fundi and vessels. Sinuses: No frontal/maxillary tenderness ENT/Mouth: EACs patent / TMs  nl. Nares clear without erythema, swelling, mucoid exudates. Oral hygiene is good. No erythema, swelling, or exudate. Tongue normal, non-obstructing. Tonsils not swollen or erythematous. Hearing normal.  Neck: Supple, thyroid not palpable. No bruits, nodes or JVD. Respiratory: Respiratory effort normal.  BS equal and clear bilateral without rales, rhonci, wheezing or stridor. Cardio: Heart sounds are normal with regular rate and rhythm and no murmurs, rubs or gallops. Peripheral pulses are normal and equal bilaterally without edema. No aortic or femoral bruits. Chest: symmetric with normal excursions and percussion.  Abdomen: Soft, with Nl bowel sounds. Nontender, no guarding, rebound, hernias, masses, or organomegaly.  Lymphatics: Non tender without lymphadenopathy.  Musculoskeletal: Full ROM all peripheral extremities, joint  stability, 5/5 strength, and normal gait. Skin: Warm and dry without rashes, lesions, cyanosis, clubbing or  ecchymosis.  Neuro: Cranial nerves intact, reflexes equal bilaterally. Normal muscle tone, no cerebellar symptoms. Sensation intact.  Pysch: Alert and oriented X 3 with normal affect, insight and judgment appropriate.   Assessment and Plan  1. Labile hypertension  2. Hyperlipidemia, mixed  3. Abnormal glucose  4. Vitamin D deficiency  5. Left sided ulcerative (chronic) colitis ([place])            Patient was counseled in prudent diet, BP monitoring, regular exercise and medications as discussed.  Discussed med effects and SE's. Routine screening labs deferred today at patient request & wishes to return in January when he will have [employer] coverage.    Kirtland Bouchard, MD

## 2021-04-21 ENCOUNTER — Encounter: Payer: Self-pay | Admitting: Internal Medicine

## 2021-04-21 ENCOUNTER — Ambulatory Visit: Payer: Self-pay | Admitting: Internal Medicine

## 2021-04-21 ENCOUNTER — Other Ambulatory Visit: Payer: Self-pay

## 2021-04-21 VITALS — BP 118/76 | HR 65 | Temp 97.7°F | Resp 16 | Ht 69.0 in | Wt 169.2 lb

## 2021-04-21 DIAGNOSIS — R0989 Other specified symptoms and signs involving the circulatory and respiratory systems: Secondary | ICD-10-CM

## 2021-04-21 DIAGNOSIS — Z79899 Other long term (current) drug therapy: Secondary | ICD-10-CM

## 2021-04-21 DIAGNOSIS — E559 Vitamin D deficiency, unspecified: Secondary | ICD-10-CM

## 2021-04-21 DIAGNOSIS — Z125 Encounter for screening for malignant neoplasm of prostate: Secondary | ICD-10-CM

## 2021-04-21 DIAGNOSIS — K5150 Left sided colitis without complications: Secondary | ICD-10-CM

## 2021-04-21 DIAGNOSIS — E782 Mixed hyperlipidemia: Secondary | ICD-10-CM

## 2021-04-21 DIAGNOSIS — R7309 Other abnormal glucose: Secondary | ICD-10-CM

## 2021-04-21 DIAGNOSIS — Z0001 Encounter for general adult medical examination with abnormal findings: Secondary | ICD-10-CM

## 2021-04-21 NOTE — Patient Instructions (Signed)

## 2021-07-25 ENCOUNTER — Encounter (HOSPITAL_COMMUNITY): Payer: Self-pay | Admitting: *Deleted

## 2021-07-25 ENCOUNTER — Emergency Department (HOSPITAL_COMMUNITY): Payer: Medicare Other

## 2021-07-25 ENCOUNTER — Other Ambulatory Visit: Payer: Self-pay

## 2021-07-25 ENCOUNTER — Emergency Department (HOSPITAL_COMMUNITY)
Admission: EM | Admit: 2021-07-25 | Discharge: 2021-07-26 | Disposition: A | Discharge status: Home / Self Care | Payer: Medicare Other | Attending: Emergency Medicine | Admitting: Emergency Medicine

## 2021-07-25 ENCOUNTER — Telehealth: Payer: Self-pay

## 2021-07-25 DIAGNOSIS — M5412 Radiculopathy, cervical region: Secondary | ICD-10-CM | POA: Diagnosis not present

## 2021-07-25 DIAGNOSIS — M2578 Osteophyte, vertebrae: Secondary | ICD-10-CM | POA: Diagnosis not present

## 2021-07-25 DIAGNOSIS — Z7982 Long term (current) use of aspirin: Secondary | ICD-10-CM | POA: Diagnosis not present

## 2021-07-25 DIAGNOSIS — R200 Anesthesia of skin: Secondary | ICD-10-CM | POA: Diagnosis not present

## 2021-07-25 DIAGNOSIS — R5383 Other fatigue: Secondary | ICD-10-CM | POA: Insufficient documentation

## 2021-07-25 DIAGNOSIS — R9431 Abnormal electrocardiogram [ECG] [EKG]: Secondary | ICD-10-CM | POA: Diagnosis not present

## 2021-07-25 DIAGNOSIS — M25511 Pain in right shoulder: Secondary | ICD-10-CM | POA: Diagnosis not present

## 2021-07-25 DIAGNOSIS — M4802 Spinal stenosis, cervical region: Secondary | ICD-10-CM | POA: Diagnosis not present

## 2021-07-25 LAB — BASIC METABOLIC PANEL
Anion gap: 9 (ref 5–15)
BUN: 15 mg/dL (ref 8–23)
CO2: 25 mmol/L (ref 22–32)
Calcium: 9.3 mg/dL (ref 8.9–10.3)
Chloride: 107 mmol/L (ref 98–111)
Creatinine, Ser: 1.07 mg/dL (ref 0.61–1.24)
GFR, Estimated: >60 mL/min (ref >60)
Glucose, Bld: 103 mg/dL — ABNORMAL HIGH (ref 70–99)
Potassium: 4.1 mmol/L (ref 3.5–5.1)
Sodium: 141 mmol/L (ref 135–145)

## 2021-07-25 LAB — CBC
HCT: 40.9 % (ref 39.0–52.0)
Hemoglobin: 13.7 g/dL (ref 13.0–17.0)
MCH: 31.5 pg (ref 26.0–34.0)
MCHC: 33.5 g/dL (ref 30.0–36.0)
MCV: 94 fL (ref 80.0–100.0)
Platelets: 277 10*3/uL (ref 150–400)
RBC: 4.35 MIL/uL (ref 4.22–5.81)
RDW: 13.1 % (ref 11.5–15.5)
WBC: 6.4 10*3/uL (ref 4.0–10.5)
nRBC: 0 % (ref 0.0–0.2)

## 2021-07-25 IMAGING — DX DG SHOULDER 2+V*R*
3 series · 3 of 3 positions shown · non-contrast
Comparison: None.

CLINICAL DATA: Right shoulder pain radiating to the neck and face

EXAM:
RIGHT SHOULDER - 2+ VIEW

[shoulder grashey]
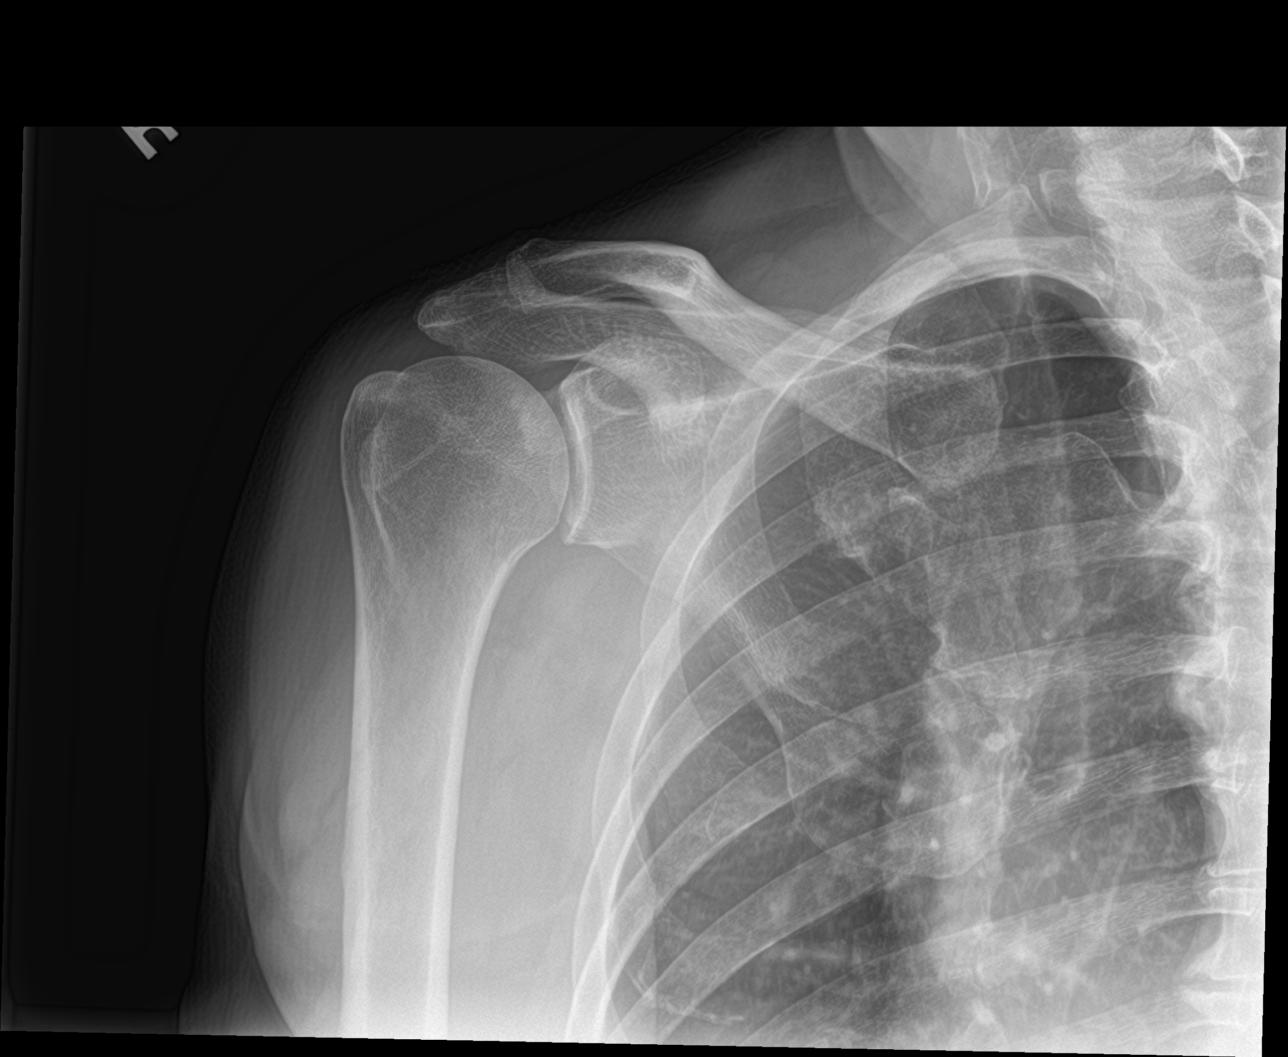

[shoulder y view]
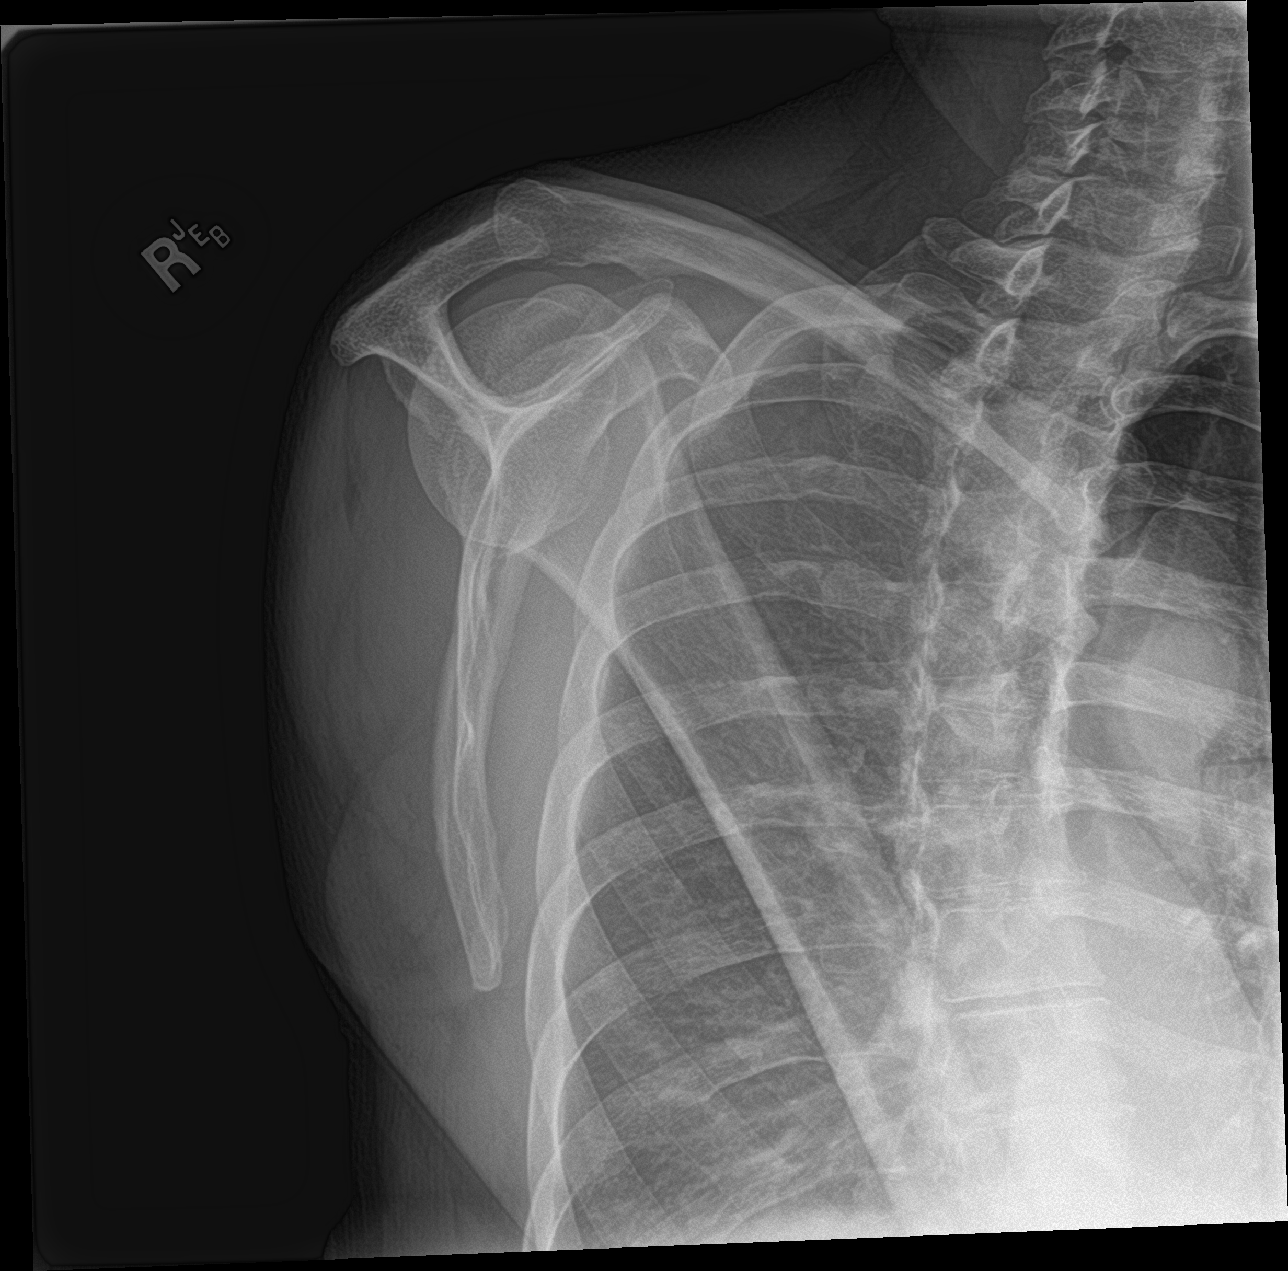

[shoulder axillary]
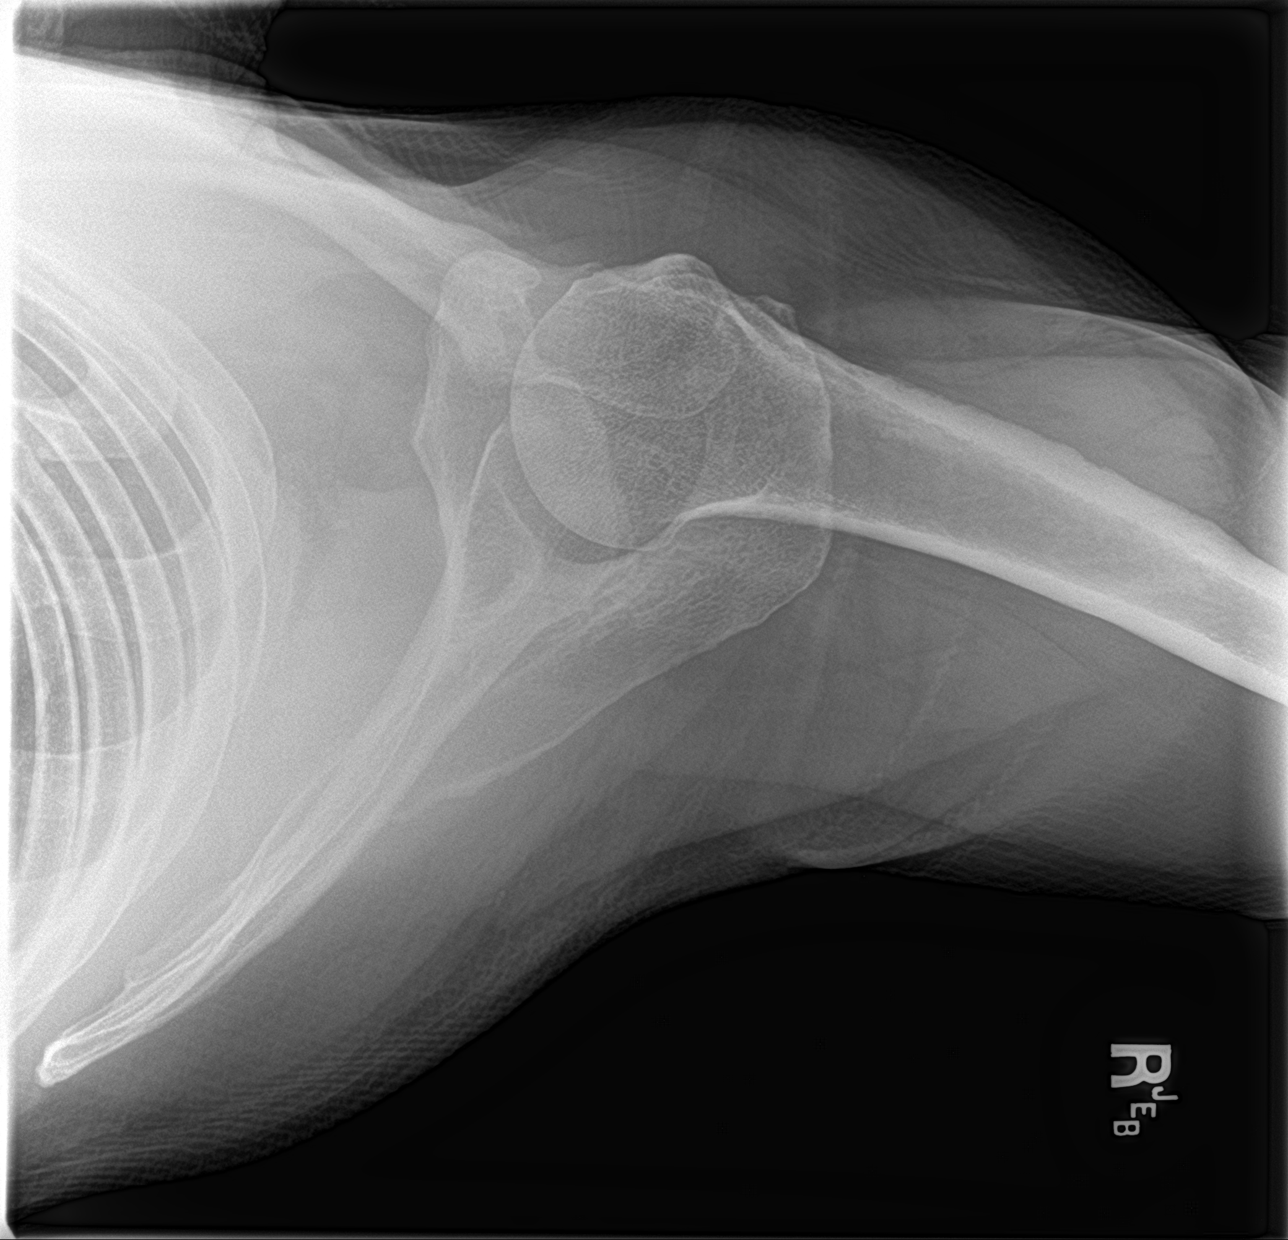

[3 of 3 positions shown; findings below may reference images not displayed]

FINDINGS: There is no evidence of fracture or dislocation. There is no
evidence of arthropathy or other focal bone abnormality. Soft
tissues are unremarkable.
IMPRESSION: Negative.

## 2021-07-25 IMAGING — CT CT CERVICAL SPINE W/O CM
3 of 4 series · 12 of 33 positions shown, 14 images · non-contrast
Comparison: No prior CT

CLINICAL DATA: Cervical radiculopathy, no red flags, right-sided
pain that radiates up the neck



[Series 8: sag bone · sagittal · 0.24mm/px · 5 of 59 slices shown, 6 images]
[im 20/59  bone]
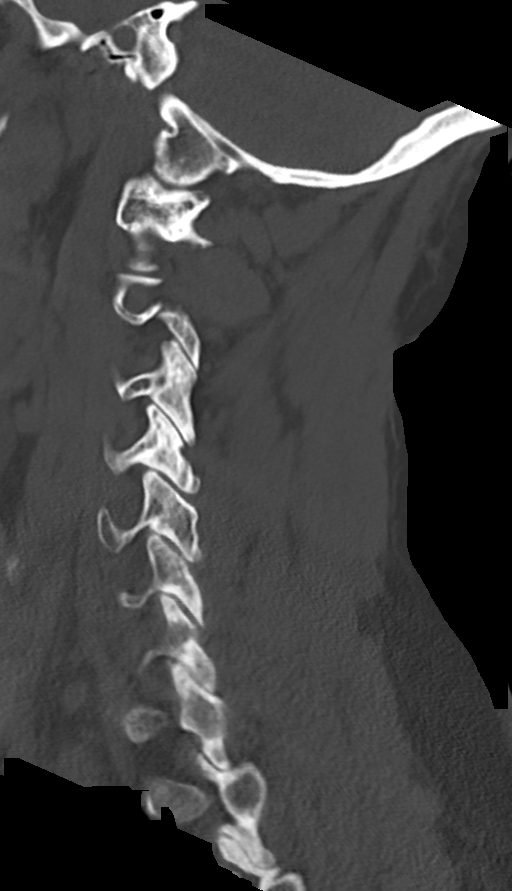
[im 25/59  bone]
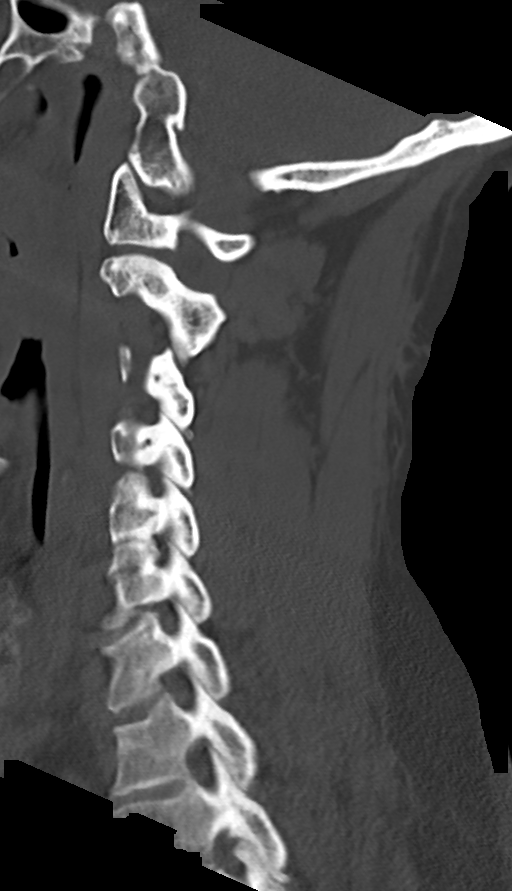
[im 30/59  soft-tissue]
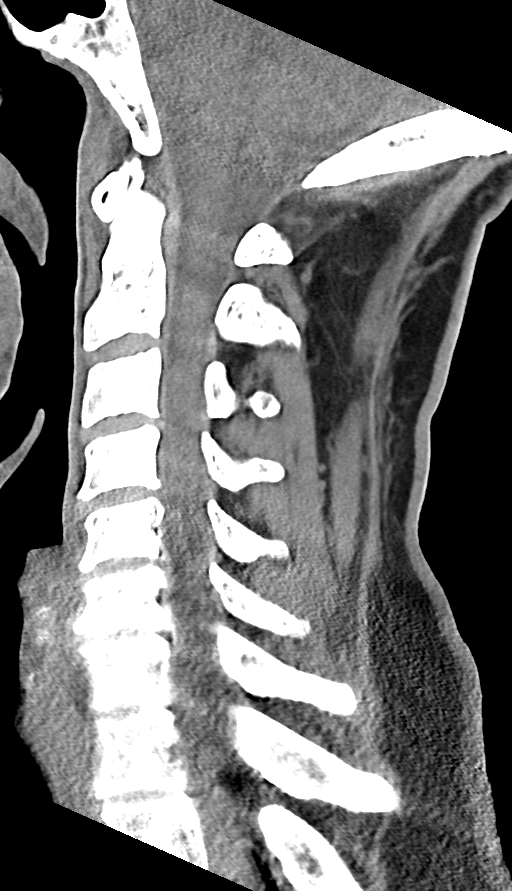
[im 30/59  bone]
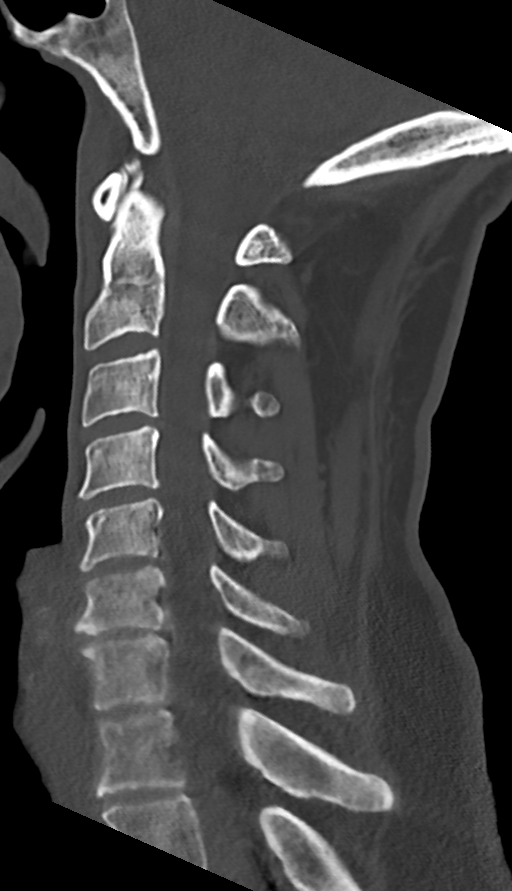
[im 34/59  bone]
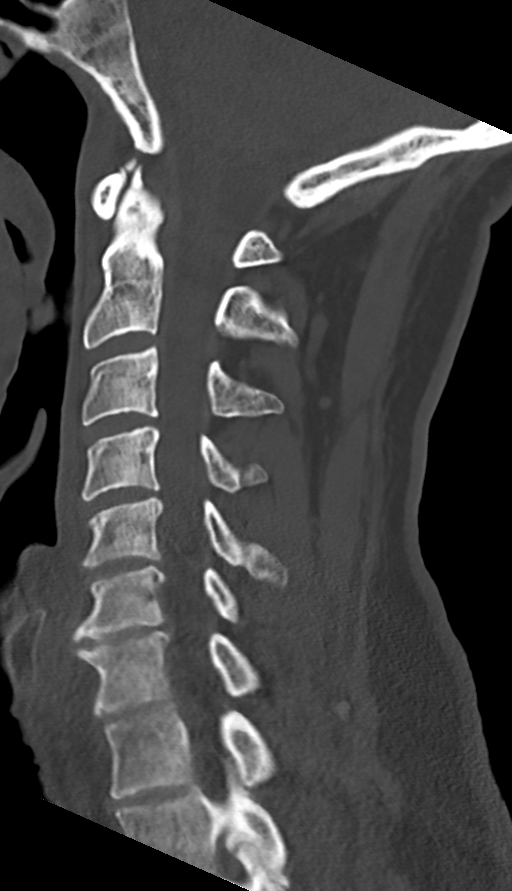
[im 39/59  bone]
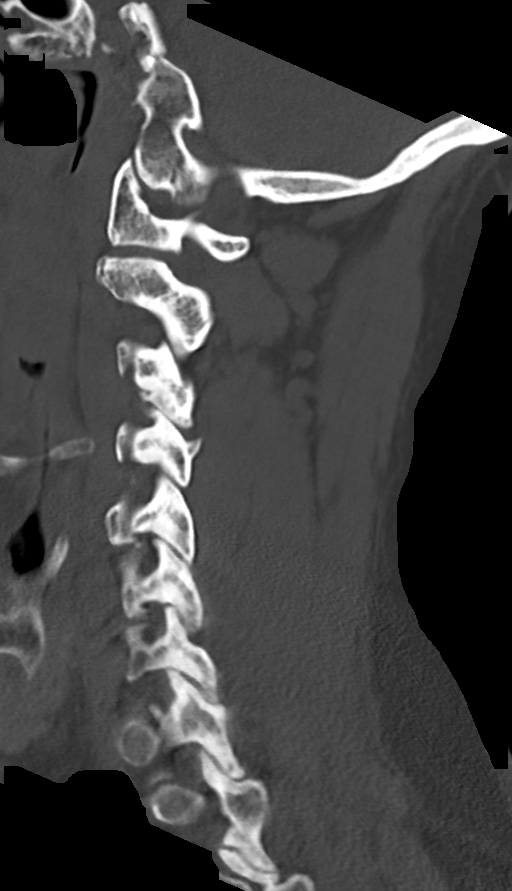

[Series 9: cor bone · coronal · 0.32mm/px · 3 of 61 slices shown]
[im 13/61  bone]
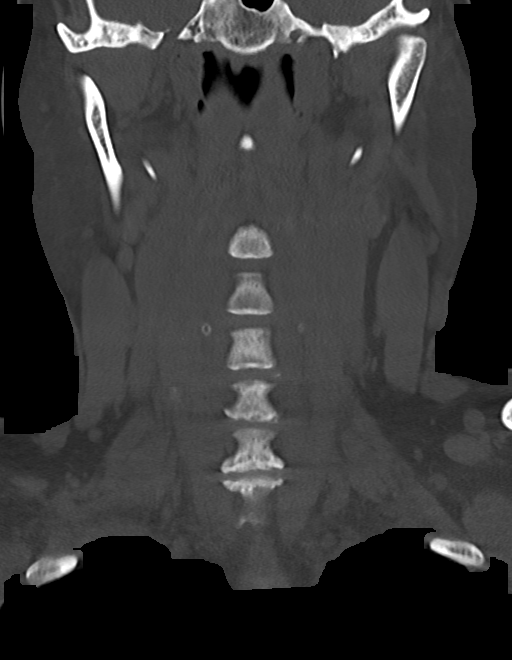
[im 25/61  bone]
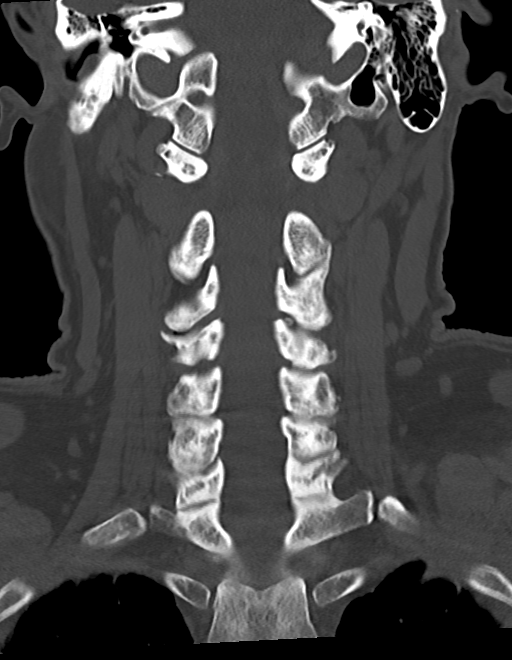
[im 37/61  bone]
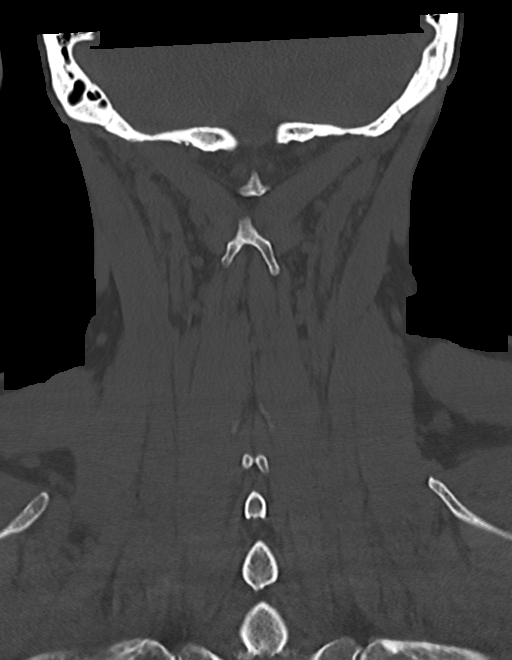

[Series 10: orthogonal axials · axial · 0.21mm/px · z∈[+1126,+1243]mm · 4 of 90 slices shown, 5 images]
[im 15/90  soft-tissue]
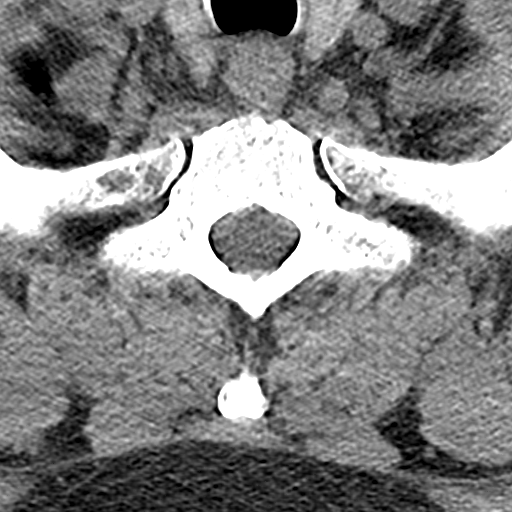
[im 15/90  bone]
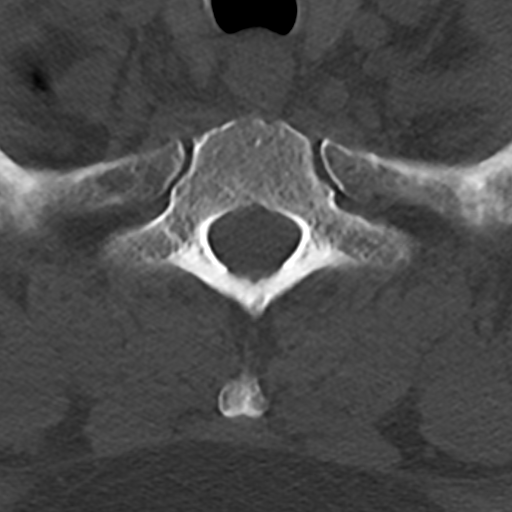
[im 30/90  bone]
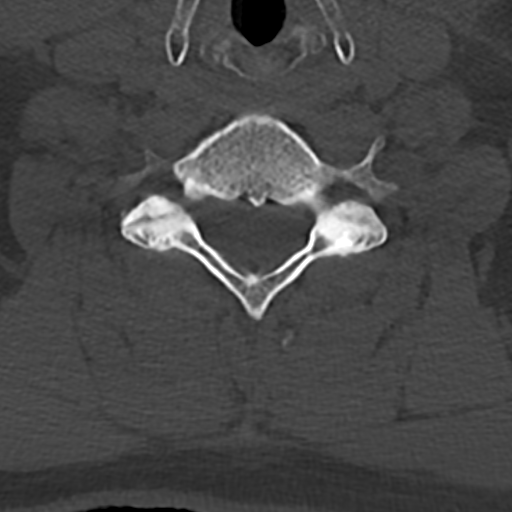
[im 60/90  bone]
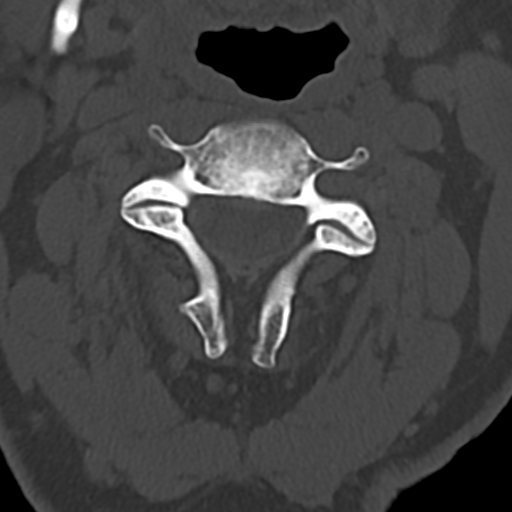
[im 75/90  bone]
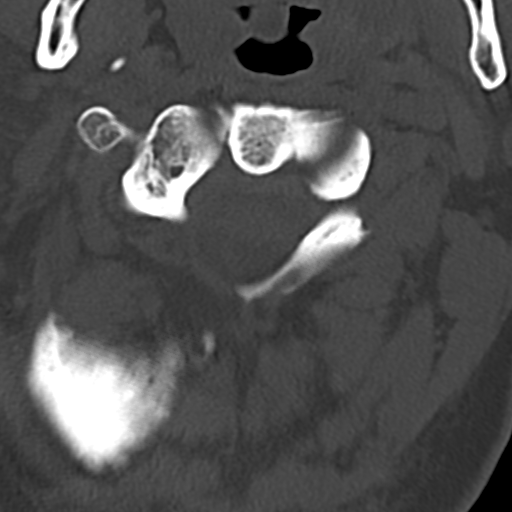

[12 of 33 positions shown; findings below may reference images not displayed]

FINDINGS: Alignment: Normal.

Skull base and vertebrae: No acute fracture. No primary bone lesion
or focal pathologic process.

Soft tissues and spinal canal: No prevertebral fluid or swelling. No
visible canal hematoma.

Disc levels:

C2-C3: Small central disc protrusion. No spinal canal stenosis or
neural foraminal narrowing.

C3-C4: Minimal central disc protrusion. No spinal canal stenosis.
Uncovertebral and facet arthropathy. Moderate right and mild left
neural foraminal narrowing.

C4-C5: No spinal canal stenosis. Uncovertebral and facet
arthropathy. Mild right and moderate left neural foraminal
narrowing.

C5-C6: Small disc bulge. No spinal canal stenosis. Uncovertebral and
facet arthropathy. Severe left and moderate right neural foraminal
narrowing.

C6-C7: Disc height loss with small disc osteophyte complex and mild
disc bulge. Mild spinal canal stenosis. Uncovertebral and facet
arthropathy. Moderate bilateral neural foraminal narrowing.

C7-T1: No significant disc bulge. No spinal canal stenosis or
neuroforaminal narrowing.

Upper chest: Centrilobular and paraseptal emphysema. No focal
pulmonary opacity or pleural effusion.

Other: None.
IMPRESSION: 1. C6-C7 mild spinal canal stenosis with moderate bilateral neural
foraminal narrowing.
2. No other spinal canal stenosis. Multilevel uncovertebral and
facet arthropathy, which is worst on the left at C5-C6, where it is
severe. Additional neural foraminal narrowing, as described above.
3.  Emphysema ([4U]-[4U]).

## 2021-07-25 NOTE — Telephone Encounter (Signed)
The patient called to report that he was having discomfort in face, neck, and shoulder (right side). He also reported radiating and tingling that comes every one hour for a 10 second period. He reports no chest pain or arm tingling.   Per Dr. Melford Aase the patient was advised to go immediately to his closest emergency room for evaluation. The patient was offered assistance with calling EMS; however, the patient declined and reported that he could drive himself over to [facility]. He gave verbal understanding to this staff member and agreed to go to the hospital for evaluation.

## 2021-07-25 NOTE — ED Triage Notes (Signed)
Pt reports ongoing intermittent numbness to right shoulder that radiates into his neck and face. He denies any other symptoms or weakness to right side. Family reports recent fatigue and indigestion.

## 2021-07-25 NOTE — ED Provider Triage Note (Signed)
Emergency Medicine Provider Triage Evaluation Note  Jeffrey Gallagher , a 66 y.o. male  was evaluated in triage.  Pt complains of pain over his right shoulder that radiates up to his neck and into his face.  Reports this has been ongoing for couple months and worsened over the past week.  Also endorses floater in his left eye.  Denies lightheadedness, syncopal episodes.  Wife reports over the same timeframe he is also endorsed indigestion and has been fatigued more than usual.  Denies chest pain, or shortness of breath.  Review of Systems  Positive: As above Negative: As above  Physical Exam  BP (!) 144/84 (BP Location: Right Arm)    Pulse 66    Temp 98.6 F (37 C) (Oral)    Resp 18    SpO2 99%  Gen:   Awake, no distress   Resp:  Normal effort  MSK:   Moves extremities without difficulty  Other:    Medical Decision Making  Medically screening exam initiated at 12:02 PM.  Appropriate orders placed.  Jeffrey Gallagher was informed that the remainder of the evaluation will be completed by another provider, this initial triage assessment does not replace that evaluation, and the importance of remaining in the ED until their evaluation is complete.     Evlyn Courier, PA-C 07/25/21 1203

## 2021-07-26 ENCOUNTER — Emergency Department (HOSPITAL_COMMUNITY): Payer: Medicare Other

## 2021-07-26 DIAGNOSIS — R200 Anesthesia of skin: Secondary | ICD-10-CM | POA: Diagnosis not present

## 2021-07-26 DIAGNOSIS — M4802 Spinal stenosis, cervical region: Secondary | ICD-10-CM | POA: Diagnosis not present

## 2021-07-26 IMAGING — MR MR CERVICAL SPINE W/O CM
5 series · 36 of 48 positions shown · non-contrast
Comparison: None.

CLINICAL DATA: Right shoulder numbness radiating to the neck and
face

EXAM:
MRI HEAD WITHOUT CONTRAST
MRI CERVICAL SPINE WITHOUT CONTRAST
TECHNIQUE: Multiplanar, multiecho pulse sequences of the brain and surrounding
structures, and cervical spine, to include the craniocervical
junction and cervicothoracic junction, were obtained without
intravenous contrast.

[Series 5: T2 · sagittal · 3.0mm · 0.69mm/px · 6 of 15 slices shown (1 of 2)]
[im 1/15]
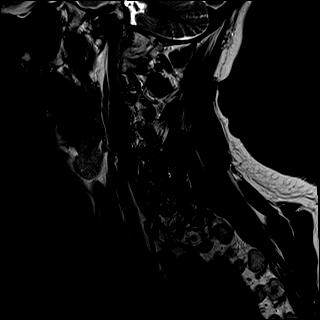
[im 3/15]
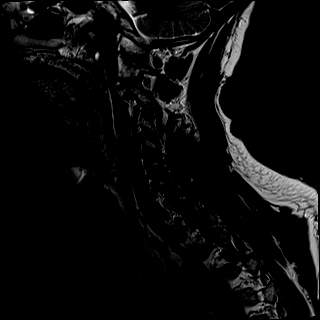
[im 6/15]
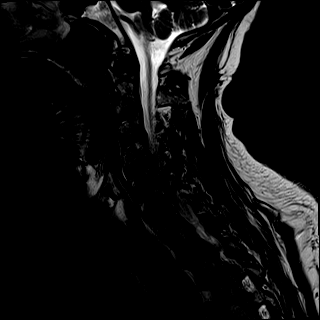
[im 9/15]
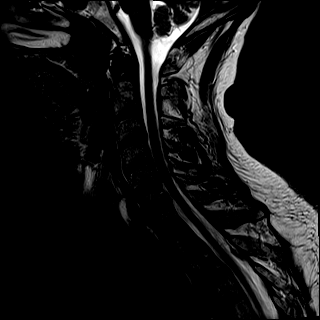
[im 12/15]
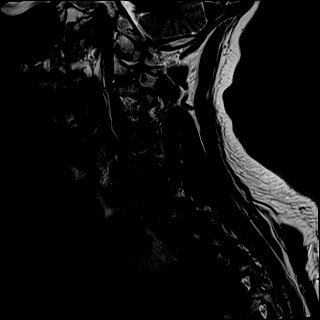
[im 15/15]
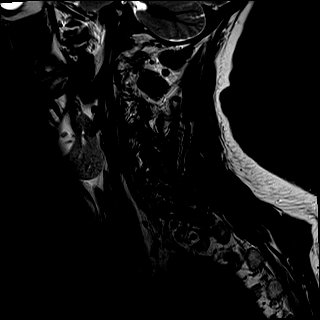

[Series 6: T1 · sagittal · 3.0mm · 0.69mm/px · 6 of 15 slices shown]
[im 1/15]
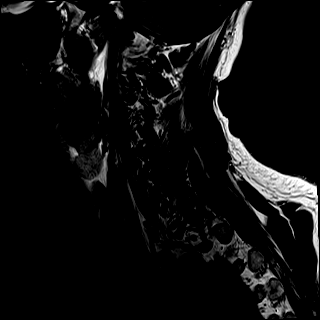
[im 3/15]
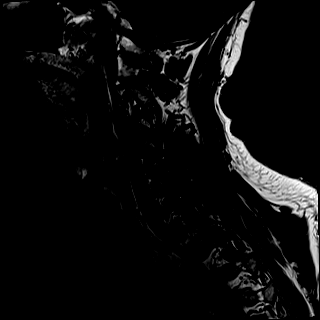
[im 6/15]
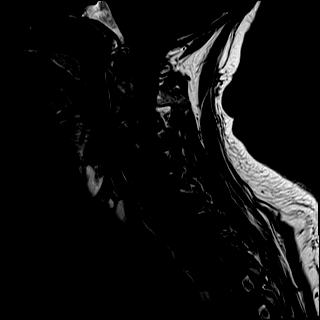
[im 9/15]
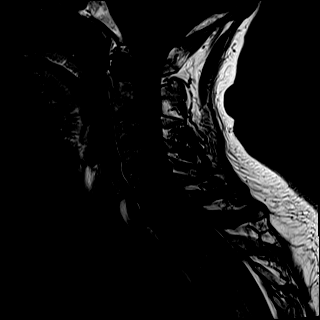
[im 12/15]
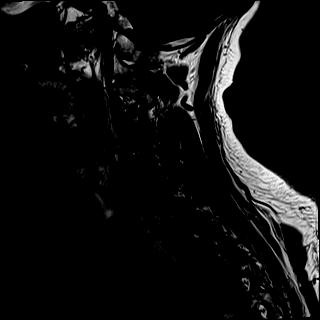
[im 15/15]
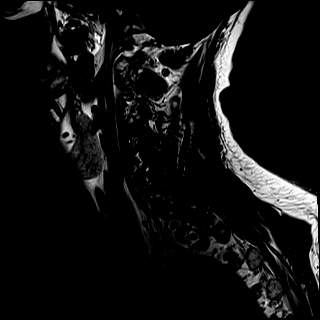

[Series 7: STIR · sagittal · 3.0mm · 0.86mm/px · 6 of 15 slices shown]
[im 1/15]
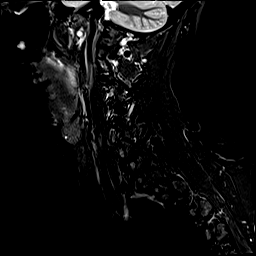
[im 3/15]
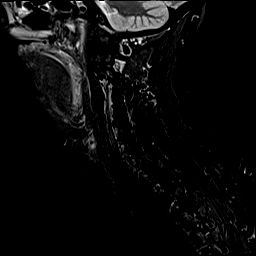
[im 6/15]
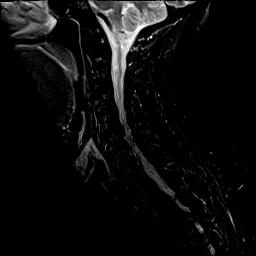
[im 9/15]
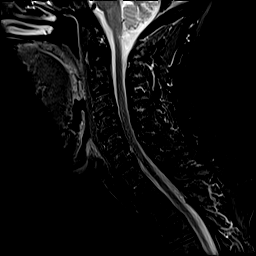
[im 12/15]
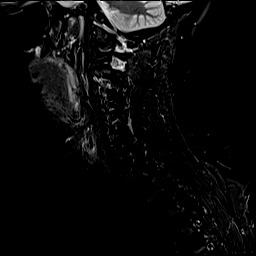
[im 15/15]
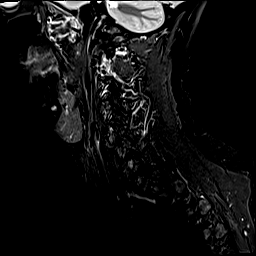

[Series 8: T2 · axial · 3.3mm · 0.66mm/px · z∈[-165,-31]mm · 10 of 40 slices shown (2 of 2)]
[im 1/40]
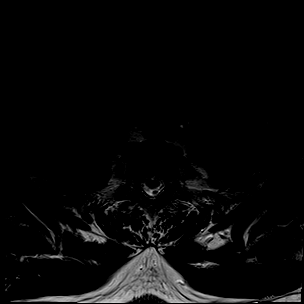
[im 3/40]
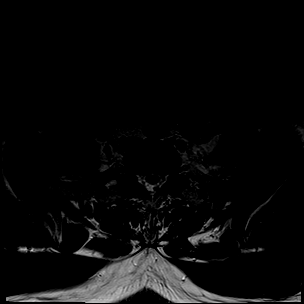
[im 6/40]
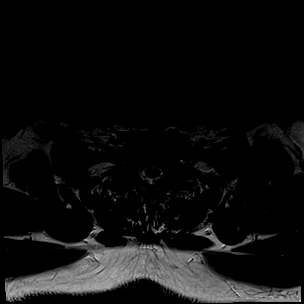
[im 12/40]
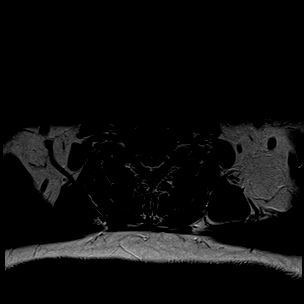
[im 17/40]
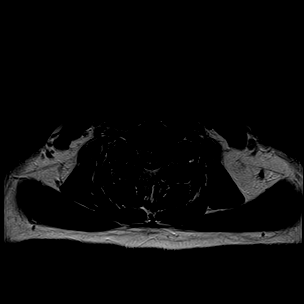
[im 20/40]
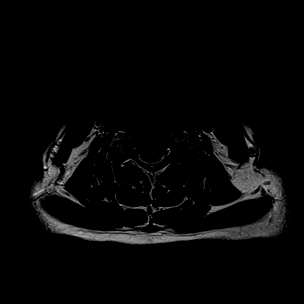
[im 23/40]
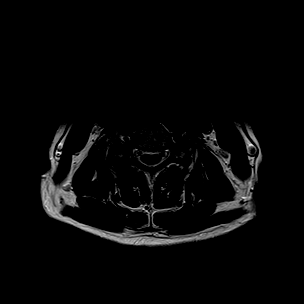
[im 28/40]
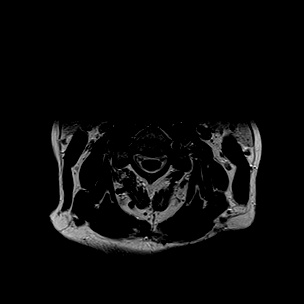
[im 34/40]
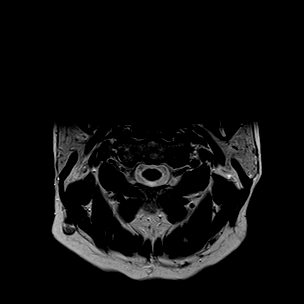
[im 40/40]
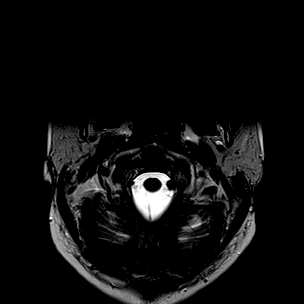

[Series 9: GRE · axial · 3.5mm · 0.39mm/px · z∈[-169,-27]mm · 8 of 40 slices shown]
[im 1/40]
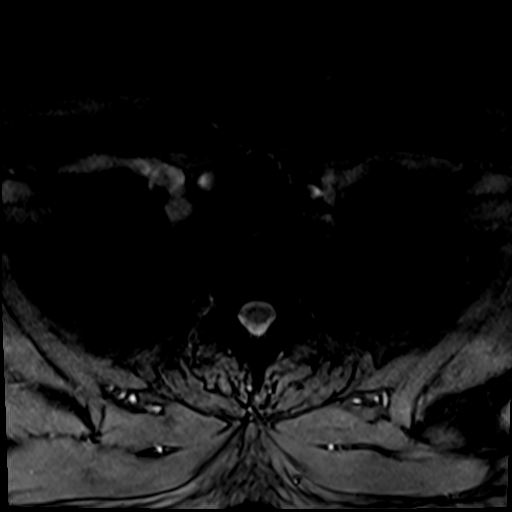
[im 6/40]
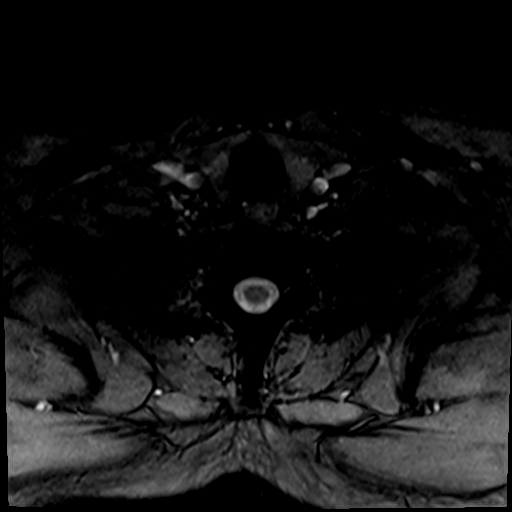
[im 12/40]
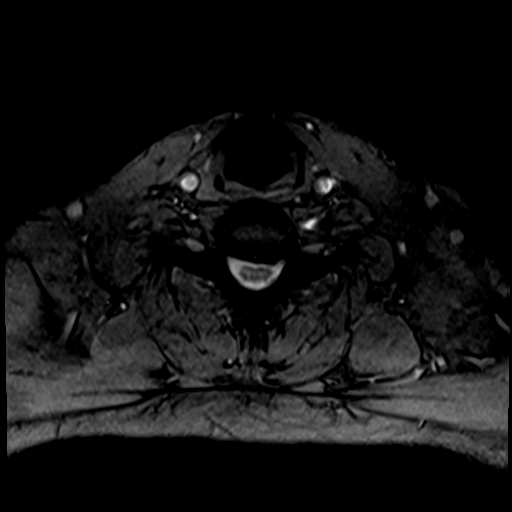
[im 17/40]
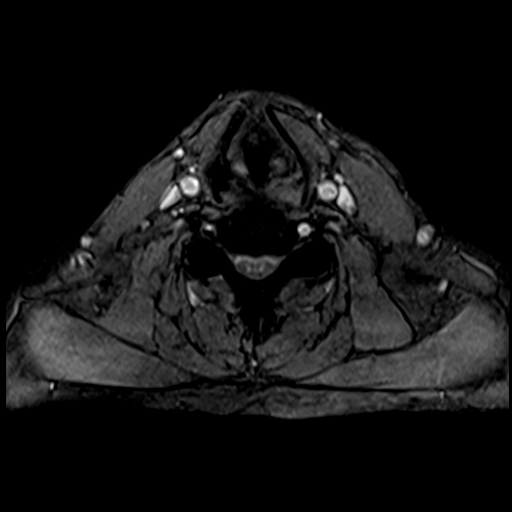
[im 23/40]
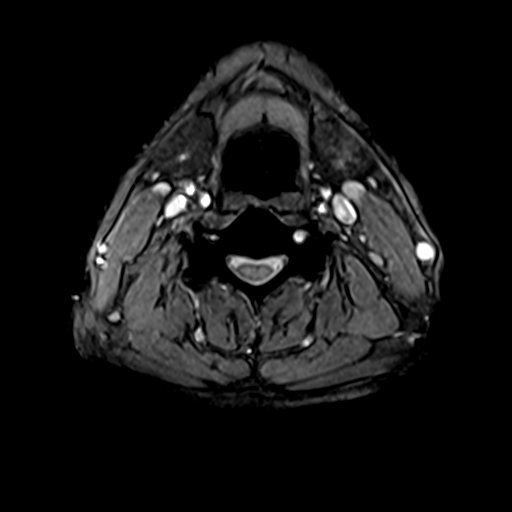
[im 28/40]
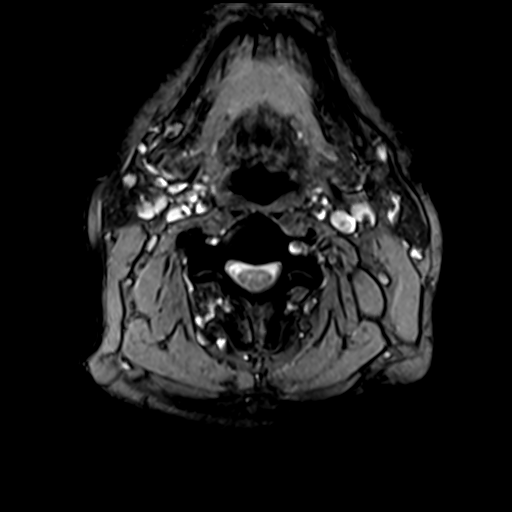
[im 34/40]
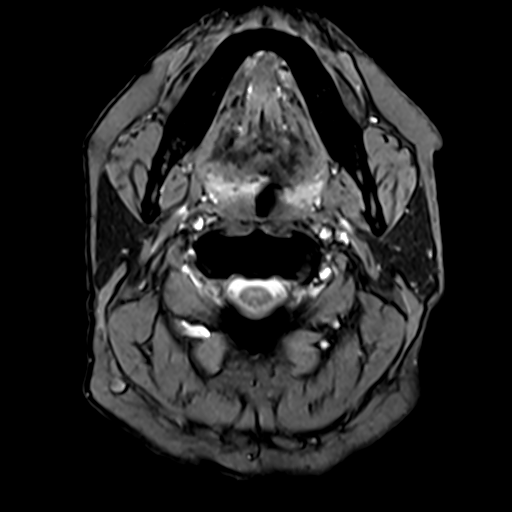
[im 40/40]
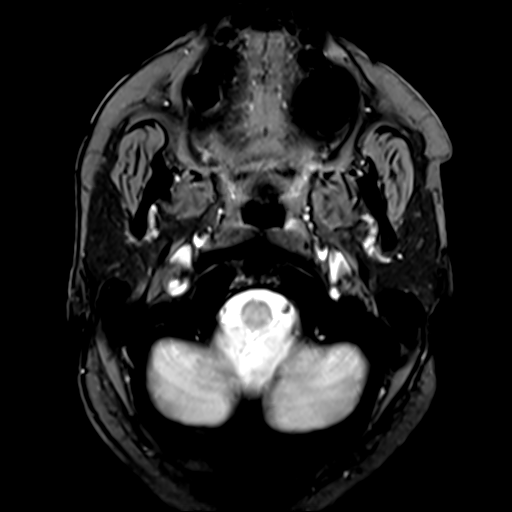

[36 of 48 positions shown; findings below may reference images not displayed]

FINDINGS: MRI HEAD FINDINGS

Brain: No acute infarct, mass effect or extra-axial collection. No
acute or chronic hemorrhage. Normal white matter signal, parenchymal
volume and CSF spaces. The midline structures are normal.

Vascular: Major flow voids are preserved.

Skull and upper cervical spine: Normal calvarium and skull base.
Visualized upper cervical spine and soft tissues are normal.

Sinuses/Orbits:No paranasal sinus fluid levels or advanced mucosal
thickening. No mastoid or middle ear effusion. Normal orbits.

MRI CERVICAL SPINE FINDINGS

Alignment: Physiologic.

Vertebrae: No fracture, evidence of discitis, or bone lesion.

Cord: Normal signal and morphology.

Posterior Fossa, vertebral arteries, paraspinal tissues: Negative.

Disc levels:

C1-2: Unremarkable.

C2-3: Normal disc space and facet joints. There is no spinal canal
stenosis. No neural foraminal stenosis.

C3-4: Right facet hypertrophy with right-greater-than-left
uncovertebral hypertrophy. There is no spinal canal stenosis. Severe
right neural foraminal stenosis.

C4-5: Small disc bulge with bilateral uncovertebral hypertrophy.
There is no spinal canal stenosis. Mild bilateral neural foraminal
stenosis.

C5-6: Disc bulge with bilateral uncovertebral hypertrophy. Mild
spinal canal stenosis. Severe bilateral neural foraminal stenosis.

C6-7: Small left subarticular disc protrusion. There is no spinal
canal stenosis. No neural foraminal stenosis.

C7-T1: Normal disc space and facet joints. There is no spinal canal
stenosis. No neural foraminal stenosis.
IMPRESSION: 1. Normal MRI of the brain.
2. Mild spinal canal stenosis and severe bilateral neural foraminal
stenosis at C5-6.
3. Severe right C3-4 neural foraminal stenosis.

## 2021-07-26 IMAGING — MR MR HEAD W/O CM
10 of 11 series · 43 of 48 positions shown · non-contrast
Comparison: None.

CLINICAL DATA: Right shoulder numbness radiating to the neck and
face

EXAM:
MRI HEAD WITHOUT CONTRAST
MRI CERVICAL SPINE WITHOUT CONTRAST
TECHNIQUE: Multiplanar, multiecho pulse sequences of the brain and surrounding
structures, and cervical spine, to include the craniocervical
junction and cervicothoracic junction, were obtained without
intravenous contrast.

[Series 5: DWI · axial · 3.0mm · 0.88mm/px · z∈[-61,+86]mm · 10 of 100 slices shown (1 of 4)]
[im 1/100]
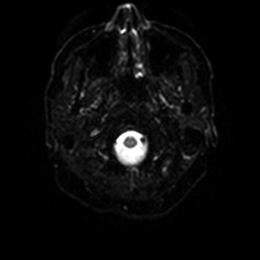
[im 12/100]
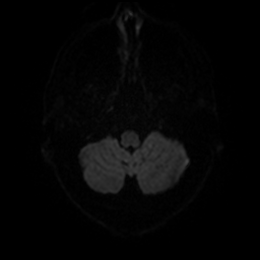
[im 23/100]
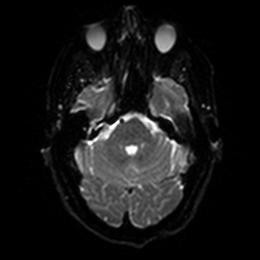
[im 34/100]
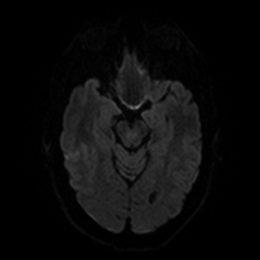
[im 45/100]
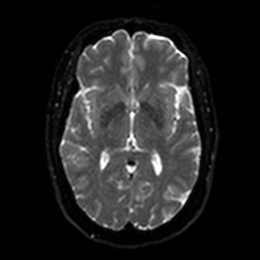
[im 56/100]
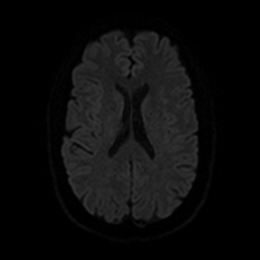
[im 67/100]
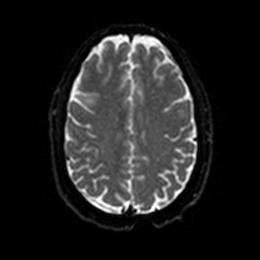
[im 78/100]
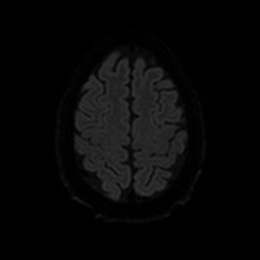
[im 89/100]
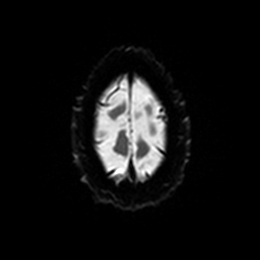
[im 100/100]
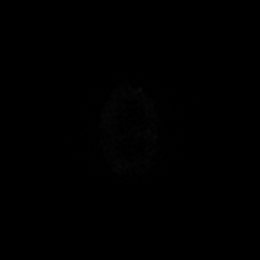

[Series 6: DWI · axial · 3.0mm · 0.88mm/px · z∈[-61,+86]mm · 5 of 49 slices shown (2 of 4)]
[im 1/49]
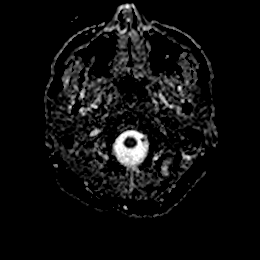
[im 13/49]
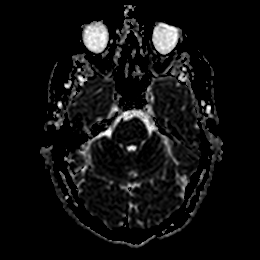
[im 25/49]
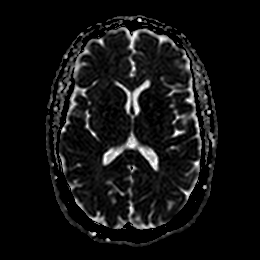
[im 37/49]
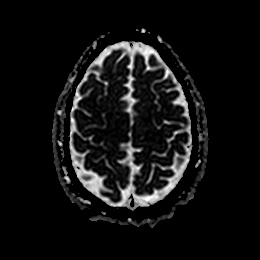
[im 49/49]
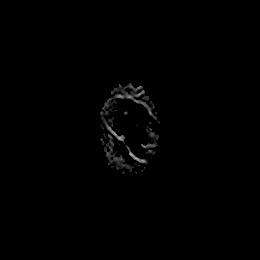

[Series 7: DWI · coronal · 4.0mm · 0.88mm/px · 6 of 66 slices shown (3 of 4)]
[im 1/66]
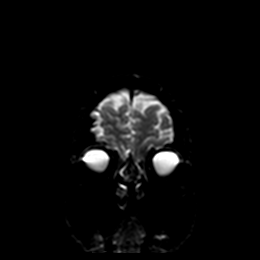
[im 14/66]
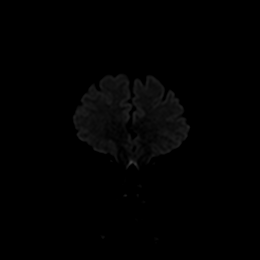
[im 27/66]
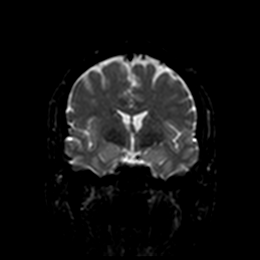
[im 40/66]
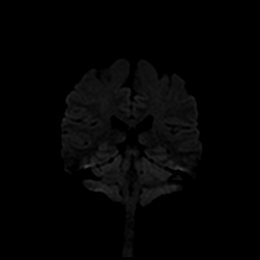
[im 53/66]
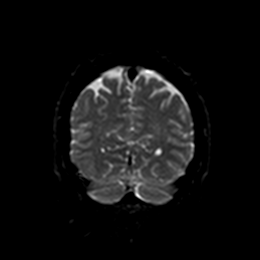
[im 66/66]
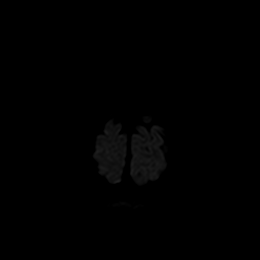

[Series 8: DWI · coronal · 4.0mm · 0.88mm/px · 3 of 33 slices shown (4 of 4)]
[im 1/33]
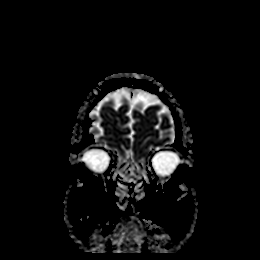
[im 17/33]
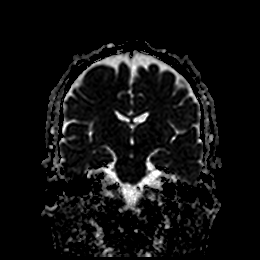
[im 33/33]
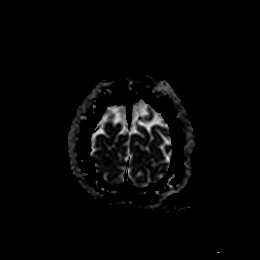

[Series 9: T1 · sagittal · 5.0mm · 0.75mm/px · 2 of 23 slices shown]
[im 1/23]
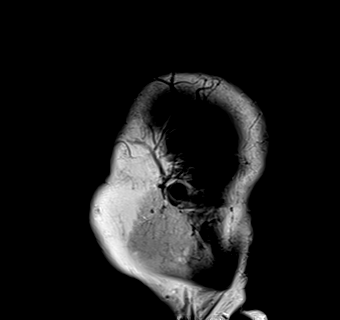
[im 23/23]
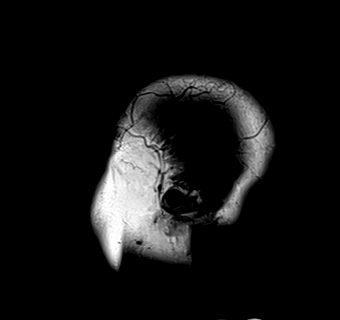

[Series 10: T2 · axial · 5.0mm · 0.72mm/px · z∈[-60,+84]mm · 2 of 25 slices shown (1 of 2)]
[im 1/25]
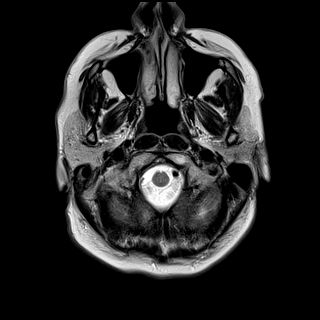
[im 25/25]
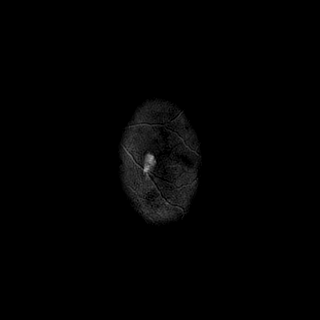

[Series 11: FLAIR · axial · 5.0mm · 0.45mm/px · z∈[-59,+84]mm · 2 of 25 slices shown]
[im 1/25]
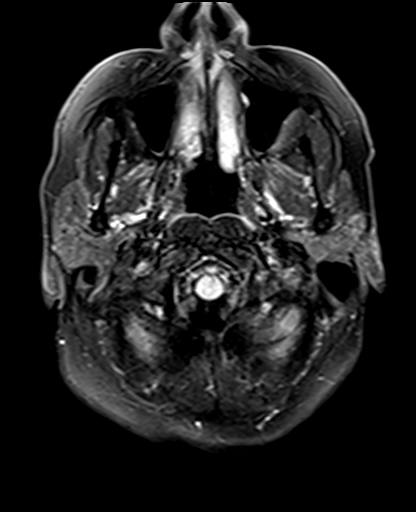
[im 25/25]
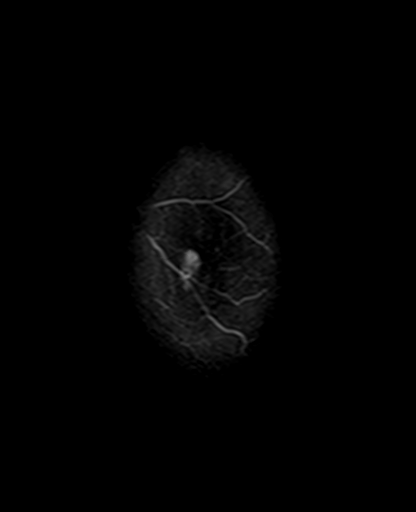

[Series 13: pha_images · axial · 3.0mm · 0.90mm/px · z∈[-79,+98]mm · 5 of 58 slices shown]
[im 1/58]
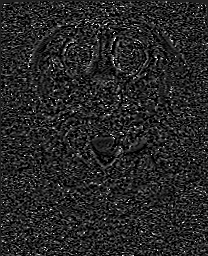
[im 15/58]
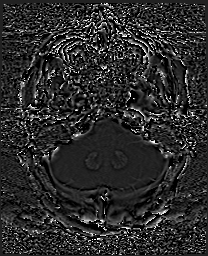
[im 29/58]
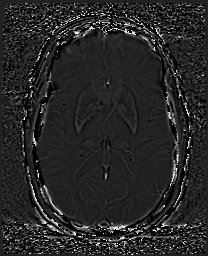
[im 43/58]
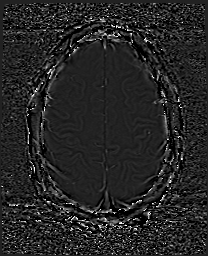
[im 58/58]
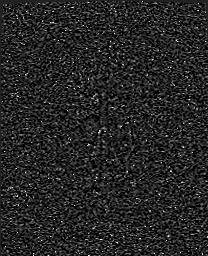

[Series 14: swi_images · axial · 3.0mm · 0.90mm/px · z∈[-79,+98]mm · 5 of 60 slices shown]
[im 1/60]
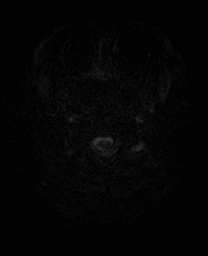
[im 15/60]
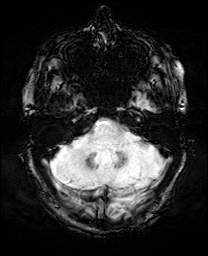
[im 30/60]
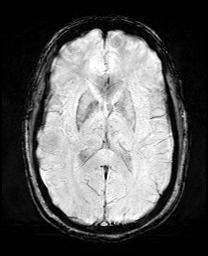
[im 45/60]
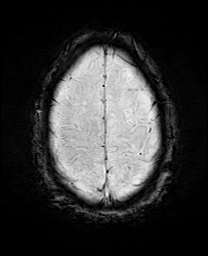
[im 60/60]
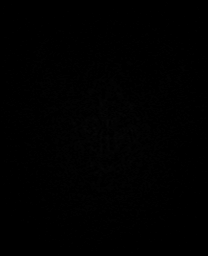

[Series 17: T2 · coronal · 5.0mm · 0.34mm/px · 3 of 29 slices shown (2 of 2)]
[im 1/29]
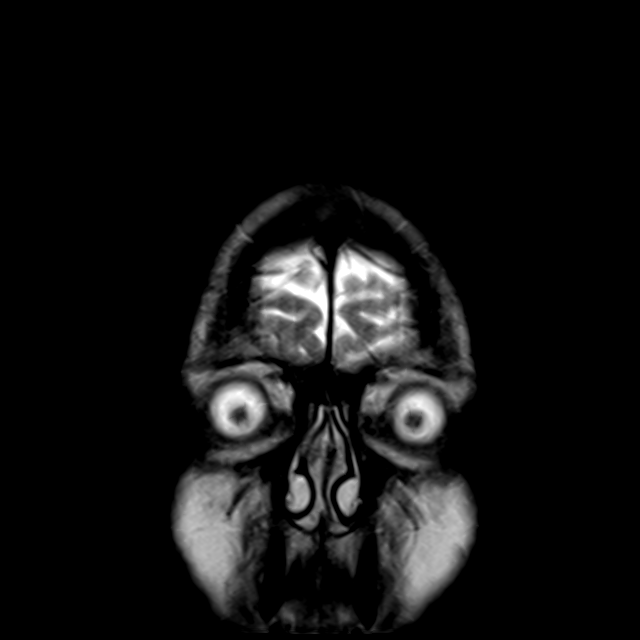
[im 15/29]
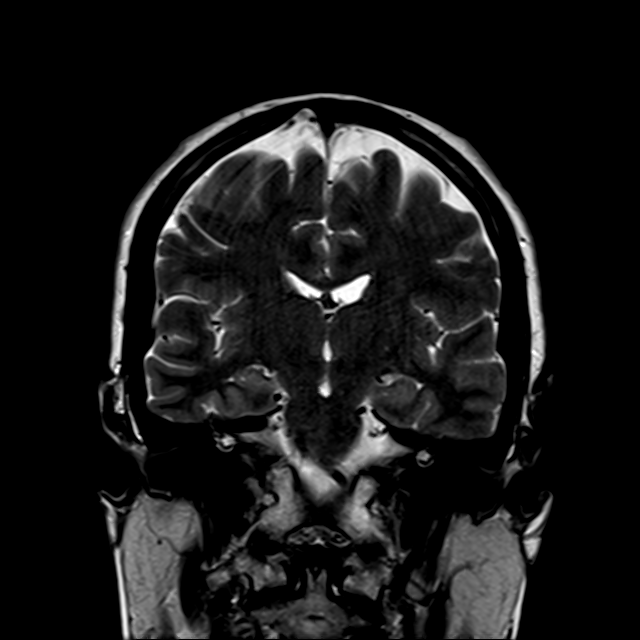
[im 29/29]
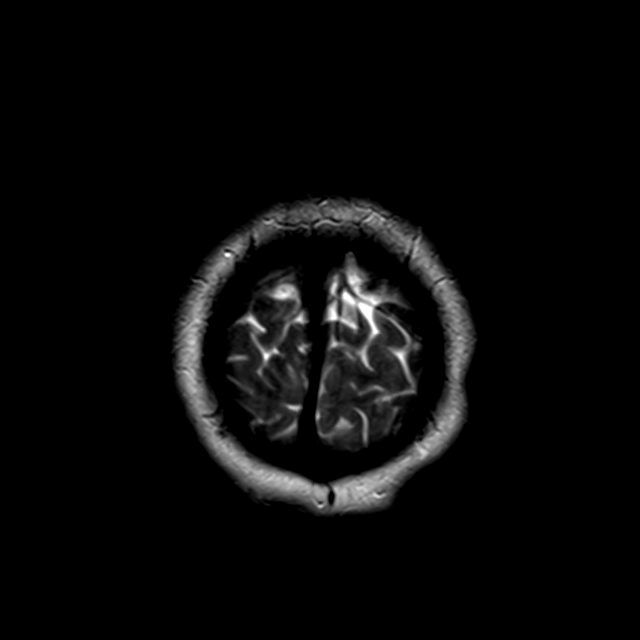

[43 of 48 positions shown; findings below may reference images not displayed]

FINDINGS: MRI HEAD FINDINGS

Brain: No acute infarct, mass effect or extra-axial collection. No
acute or chronic hemorrhage. Normal white matter signal, parenchymal
volume and CSF spaces. The midline structures are normal.

Vascular: Major flow voids are preserved.

Skull and upper cervical spine: Normal calvarium and skull base.
Visualized upper cervical spine and soft tissues are normal.

Sinuses/Orbits:No paranasal sinus fluid levels or advanced mucosal
thickening. No mastoid or middle ear effusion. Normal orbits.

MRI CERVICAL SPINE FINDINGS

Alignment: Physiologic.

Vertebrae: No fracture, evidence of discitis, or bone lesion.

Cord: Normal signal and morphology.

Posterior Fossa, vertebral arteries, paraspinal tissues: Negative.

Disc levels:

C1-2: Unremarkable.

C2-3: Normal disc space and facet joints. There is no spinal canal
stenosis. No neural foraminal stenosis.

C3-4: Right facet hypertrophy with right-greater-than-left
uncovertebral hypertrophy. There is no spinal canal stenosis. Severe
right neural foraminal stenosis.

C4-5: Small disc bulge with bilateral uncovertebral hypertrophy.
There is no spinal canal stenosis. Mild bilateral neural foraminal
stenosis.

C5-6: Disc bulge with bilateral uncovertebral hypertrophy. Mild
spinal canal stenosis. Severe bilateral neural foraminal stenosis.

C6-7: Small left subarticular disc protrusion. There is no spinal
canal stenosis. No neural foraminal stenosis.

C7-T1: Normal disc space and facet joints. There is no spinal canal
stenosis. No neural foraminal stenosis.
IMPRESSION: 1. Normal MRI of the brain.
2. Mild spinal canal stenosis and severe bilateral neural foraminal
stenosis at C5-6.
3. Severe right C3-4 neural foraminal stenosis.

## 2021-07-26 MED ORDER — LORAZEPAM 1 MG PO TABS
1.0000 mg | ORAL_TABLET | Freq: Once | ORAL | Status: AC
Start: 2021-07-26 — End: 2021-07-26
  Administered 2021-07-26: 1 mg via ORAL
  Filled 2021-07-26: qty 1

## 2021-07-26 NOTE — ED Notes (Signed)
RN reviewed discharge instructions with pt. Pt verbalized understanding and had no further questions. VSS upon discharge.  

## 2021-07-26 NOTE — ED Provider Notes (Signed)
[facility] Provider Note   CSN: [id number] Arrival date & time: 07/25/21  1027     History  Chief Complaint  Patient presents with   Numbness    Jeffrey Gallagher is a 66 y.o. male.  The history is provided by the patient and a relative.  Neurologic Problem This is a new problem. The current episode started more than 1 week ago. The problem has been gradually worsening. Pertinent negatives include no chest pain, no headaches and no shortness of breath. Nothing aggravates the symptoms. Nothing relieves the symptoms.  Patient presents for multiple complaints.  He reports the past several months he will have numbness in his right shoulder and neck.  No significant pain.  He reports over the past several days has been having numbness into his right face.  No weakness, no slurred speech.  No arm or leg weakness.  No headache.  No new visual changes. He called his PCP who told him to go to the ER He reports he is very active and golfs.   Wife reports he has  also appeared more fatigued lately and he reports indigestion.  No chest pain Home Medications Prior to Admission medications   Medication Sig Start Date End Date Taking? Authorizing Provider  aspirin 81 MG tablet Take 81 mg by mouth daily.    [provider]  Cholecalciferol (VITAMIN D PO) Take 6,000 Int'l Units by mouth daily.    [provider]  mesalamine (LIALDA) 1.2 g EC tablet Take 4 tablets (4.8 g total) by mouth daily. 02/28/21   Thornton Park, MD  Multiple Vitamins-Minerals (MULTIVITAMIN PO) Take by mouth. Take 1 tab daily    [provider]  OVER THE COUNTER MEDICATION OTC Krill oil 1 capsule daily. Patient not taking: Reported on 04/21/2021    [provider]  OVER THE COUNTER MEDICATION Flaxseed oil 1 capsule daily.    [provider]  Probiotic Product (PROBIOTIC PO) Take 1 capsule by mouth daily.    [provider]       Allergies    Patient has no known allergies.    Review of Systems   Review of Systems  Constitutional:  Negative for fever.  Eyes:        Chronic floater in OS  Respiratory:  Negative for shortness of breath.   Cardiovascular:  Negative for chest pain.  Gastrointestinal:        Indigestion   Musculoskeletal:  Positive for neck pain.  Neurological:  Positive for numbness. Negative for facial asymmetry, speech difficulty, weakness and headaches.  All other systems reviewed and are negative.  Physical Exam Updated Vital Signs BP (!) 155/80 (BP Location: Right Arm)    Pulse 68    Temp 98.6 F (37 C) (Oral)    Resp 14    SpO2 98%  Physical Exam CONSTITUTIONAL: Well developed/well nourished HEAD: Normocephalic/atraumatic EYES: EOMI/PERRL, no nystagmus, no ptosis ENMT: Mucous membranes moist NECK: supple no meningeal signs, no bruits CV: S1/S2 noted, no murmurs/rubs/gallops noted LUNGS: Lungs are clear to auscultation bilaterally, no apparent distress ABDOMEN: soft, nontender, no rebound or guarding GU:no cva tenderness NEURO:Awake/alert, face symmetric, no arm or leg drift is noted Equal 5/5 strength with shoulder abduction, elbow flex/extension, wrist flex/extension in upper extremities and equal hand grips bilaterally Equal 5/5 strength with hip flexion,knee flex/extension, foot dorsi/plantar flexion Reports sensory deficits right face Gait normal without ataxia Reports numbness to the right trapezius region EXTREMITIES: pulses normal, full  ROM SKIN: warm, color normal PSYCH: no abnormalities of mood noted  ED Results / Procedures / Treatments   Labs (all labs ordered are listed, but only abnormal results are displayed) Labs Reviewed  BASIC METABOLIC PANEL - Abnormal; Notable for the following components:      Result Value   Glucose, Bld 103 (*)    All other components within normal limits  CBC    EKG ED ECG REPORT   Date: 07/26/2021 0203  Rate: 65  Rhythm:  normal sinus rhythm  QRS Axis: normal  Intervals: normal  ST/T Wave abnormalities: normal  Conduction Disutrbances:none   I have personally reviewed the EKG tracing and agree with the computerized printout as noted.   Radiology DG Shoulder Right  Result Date: 07/25/2021 CLINICAL DATA:  Right shoulder pain radiating to the neck and face EXAM: RIGHT SHOULDER - 2+ VIEW COMPARISON:  None. FINDINGS: There is no evidence of fracture or dislocation. There is no evidence of arthropathy or other focal bone abnormality. Soft tissues are unremarkable. IMPRESSION: Negative. Electronically Signed   By: Kathreen Devoid M.D.   On: 07/25/2021 12:32   CT Cervical Spine Wo Contrast  Result Date: 07/25/2021 CLINICAL DATA:  Cervical radiculopathy, no red flags, right-sided pain that radiates up the neck EXAM: CT CERVICAL SPINE WITHOUT CONTRAST TECHNIQUE: Multidetector CT imaging of the cervical spine was performed without intravenous contrast. Multiplanar CT image reconstructions were also generated. RADIATION DOSE REDUCTION: This exam was performed according to the departmental dose-optimization program which includes automated exposure control, adjustment of the mA and/or kV according to patient size and/or use of iterative reconstruction technique. COMPARISON:  No prior CT FINDINGS: Alignment: Normal. Skull base and vertebrae: No acute fracture. No primary bone lesion or focal pathologic process. Soft tissues and spinal canal: No prevertebral fluid or swelling. No visible canal hematoma. Disc levels: C2-C3: Small central disc protrusion. No spinal canal stenosis or neural foraminal narrowing. C3-C4: Minimal central disc protrusion. No spinal canal stenosis. Uncovertebral and facet arthropathy. Moderate right and mild left neural foraminal narrowing. C4-C5: No spinal canal stenosis. Uncovertebral and facet arthropathy. Mild right and moderate left neural foraminal narrowing. C5-C6: Small disc bulge. No spinal canal  stenosis. Uncovertebral and facet arthropathy. Severe left and moderate right neural foraminal narrowing. C6-C7: Disc height loss with small disc osteophyte complex and mild disc bulge. Mild spinal canal stenosis. Uncovertebral and facet arthropathy. Moderate bilateral neural foraminal narrowing. C7-T1: No significant disc bulge. No spinal canal stenosis or neuroforaminal narrowing. Upper chest: Centrilobular and paraseptal emphysema. No focal pulmonary opacity or pleural effusion. Other: None. IMPRESSION: 1. C6-C7 mild spinal canal stenosis with moderate bilateral neural foraminal narrowing. 2. No other spinal canal stenosis. Multilevel uncovertebral and facet arthropathy, which is worst on the left at C5-C6, where it is severe. Additional neural foraminal narrowing, as described above. 3.  Emphysema (ICD10-J43.9). Electronically Signed   By: Merilyn Baba M.D.   On: 07/25/2021 13:17   MR BRAIN WO CONTRAST  Result Date: 07/26/2021 CLINICAL DATA:  Right shoulder numbness radiating to the neck and face EXAM: MRI HEAD WITHOUT CONTRAST MRI CERVICAL SPINE WITHOUT CONTRAST TECHNIQUE: Multiplanar, multiecho pulse sequences of the brain and surrounding structures, and cervical spine, to include the craniocervical junction and cervicothoracic junction, were obtained without intravenous contrast. COMPARISON:  None. FINDINGS: MRI HEAD FINDINGS Brain: No acute infarct, mass effect or extra-axial collection. No acute or chronic hemorrhage. Normal white matter signal, parenchymal volume and CSF spaces. The midline structures are normal. Vascular: Major  flow voids are preserved. Skull and upper cervical spine: Normal calvarium and skull base. Visualized upper cervical spine and soft tissues are normal. Sinuses/Orbits:No paranasal sinus fluid levels or advanced mucosal thickening. No mastoid or middle ear effusion. Normal orbits. MRI CERVICAL SPINE FINDINGS Alignment: Physiologic. Vertebrae: No fracture, evidence of discitis,  or bone lesion. Cord: Normal signal and morphology. Posterior Fossa, vertebral arteries, paraspinal tissues: Negative. Disc levels: C1-2: Unremarkable. C2-3: Normal disc space and facet joints. There is no spinal canal stenosis. No neural foraminal stenosis. C3-4: Right facet hypertrophy with right-greater-than-left uncovertebral hypertrophy. There is no spinal canal stenosis. Severe right neural foraminal stenosis. C4-5: Small disc bulge with bilateral uncovertebral hypertrophy. There is no spinal canal stenosis. Mild bilateral neural foraminal stenosis. C5-6: Disc bulge with bilateral uncovertebral hypertrophy. Mild spinal canal stenosis. Severe bilateral neural foraminal stenosis. C6-7: Small left subarticular disc protrusion. There is no spinal canal stenosis. No neural foraminal stenosis. C7-T1: Normal disc space and facet joints. There is no spinal canal stenosis. No neural foraminal stenosis. IMPRESSION: 1. Normal MRI of the brain. 2. 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 3. Severe right C3-4 neural foraminal stenosis. Electronically Signed   By: Ulyses Jarred M.D.   On: 07/26/2021 03:45   MR Cervical Spine Wo Contrast  Result Date: 07/26/2021 CLINICAL DATA:  Right shoulder numbness radiating to the neck and face EXAM: MRI HEAD WITHOUT CONTRAST MRI CERVICAL SPINE WITHOUT CONTRAST TECHNIQUE: Multiplanar, multiecho pulse sequences of the brain and surrounding structures, and cervical spine, to include the craniocervical junction and cervicothoracic junction, were obtained without intravenous contrast. COMPARISON:  None. FINDINGS: MRI HEAD FINDINGS Brain: No acute infarct, mass effect or extra-axial collection. No acute or chronic hemorrhage. Normal white matter signal, parenchymal volume and CSF spaces. The midline structures are normal. Vascular: Major flow voids are preserved. Skull and upper cervical spine: Normal calvarium and skull base. Visualized upper  cervical spine and soft tissues are normal. Sinuses/Orbits:No paranasal sinus fluid levels or advanced mucosal thickening. No mastoid or middle ear effusion. Normal orbits. MRI CERVICAL SPINE FINDINGS Alignment: Physiologic. Vertebrae: No fracture, evidence of discitis, or bone lesion. Cord: Normal signal and morphology. Posterior Fossa, vertebral arteries, paraspinal tissues: Negative. Disc levels: C1-2: Unremarkable. C2-3: Normal disc space and facet joints. There is no spinal canal stenosis. No neural foraminal stenosis. C3-4: Right facet hypertrophy with right-greater-than-left uncovertebral hypertrophy. There is no spinal canal stenosis. Severe right neural foraminal stenosis. C4-5: Small disc bulge with bilateral uncovertebral hypertrophy. There is no spinal canal stenosis. Mild bilateral neural foraminal stenosis. C5-6: Disc bulge with bilateral uncovertebral hypertrophy. Mild spinal canal stenosis. Severe bilateral neural foraminal stenosis. C6-7: Small left subarticular disc protrusion. There is no spinal canal stenosis. No neural foraminal stenosis. C7-T1: Normal disc space and facet joints. There is no spinal canal stenosis. No neural foraminal stenosis. IMPRESSION: 1. Normal MRI of the brain. 2. 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 3. Severe right C3-4 neural foraminal stenosis. Electronically Signed   By: Ulyses Jarred M.D.   On: 07/26/2021 03:45    Procedures Procedures    Medications Ordered in ED Medications  LORazepam (ATIVAN) tablet 1 mg (1 mg Oral Given 07/26/21 0152)    ED Course/ Medical Decision Making/ A&P                           Medical Decision Making  This patient presents to the  ED for concern of numbness, this involves an extensive number of treatment options, and is a complaint that carries with it a high risk of complications and morbidity.  The differential diagnosis includes stroke, cervical radiculopathy, electrolyte  abnormality    Additional history obtained: Additional history obtained from spouse Records reviewed Primary Care Documents  Lab Tests: I Ordered, and personally interpreted labs.  The pertinent results include: Labs are unremarkable  Imaging Studies ordered: I ordered imaging studies including CT scan cervical spine shows spinal stenosis Right shoulder x-rays negative I independently visualized and interpreted imaging  I agree with the radiologist interpretation   Reevaluation: After the interventions noted above, I reevaluated the patient and found that they have :stayed the same  Complexity of problems addressed: Patients presentation is most consistent with  acute, uncomplicated illness      Disposition: After consideration of the diagnostic results and the patients response to treatment,  I feel that the patent would benefit from discharge .    MRI brain is negative for stroke.  He does have evidence of significant cervical foraminal stenosis which could be contribute to his symptoms. Will refer to neurosurgery.  He is safe for discharge        Final Clinical Impression(s) / ED Diagnoses Final diagnoses:  Cervical radiculopathy    Rx / DC Orders ED Discharge Orders     None         Ripley Fraise, MD 07/26/21 [phone]

## 2021-07-26 NOTE — ED Notes (Signed)
Patient transported to MRI 

## 2021-08-18 DIAGNOSIS — H5201 Hypermetropia, right eye: Secondary | ICD-10-CM | POA: Diagnosis not present

## 2021-08-18 DIAGNOSIS — H524 Presbyopia: Secondary | ICD-10-CM | POA: Diagnosis not present

## 2021-08-18 DIAGNOSIS — H43392 Other vitreous opacities, left eye: Secondary | ICD-10-CM | POA: Diagnosis not present

## 2021-08-18 DIAGNOSIS — H5212 Myopia, left eye: Secondary | ICD-10-CM | POA: Diagnosis not present

## 2021-08-25 ENCOUNTER — Ambulatory Visit (INDEPENDENT_AMBULATORY_CARE_PROVIDER_SITE_OTHER): Payer: Medicare Other | Admitting: Internal Medicine

## 2021-08-25 ENCOUNTER — Other Ambulatory Visit: Payer: Self-pay

## 2021-08-25 ENCOUNTER — Encounter: Payer: Self-pay | Admitting: Internal Medicine

## 2021-08-25 VITALS — BP 124/78 | HR 71 | Temp 97.9°F | Resp 16 | Ht 69.0 in | Wt 175.6 lb

## 2021-08-25 DIAGNOSIS — E782 Mixed hyperlipidemia: Secondary | ICD-10-CM

## 2021-08-25 DIAGNOSIS — K5150 Left sided colitis without complications: Secondary | ICD-10-CM

## 2021-08-25 DIAGNOSIS — R0989 Other specified symptoms and signs involving the circulatory and respiratory systems: Secondary | ICD-10-CM

## 2021-08-25 DIAGNOSIS — E559 Vitamin D deficiency, unspecified: Secondary | ICD-10-CM | POA: Diagnosis not present

## 2021-08-25 DIAGNOSIS — R7309 Other abnormal glucose: Secondary | ICD-10-CM | POA: Diagnosis not present

## 2021-08-25 DIAGNOSIS — Z79899 Other long term (current) drug therapy: Secondary | ICD-10-CM

## 2021-08-25 NOTE — Patient Instructions (Signed)

## 2021-08-25 NOTE — Progress Notes (Signed)
Future Appointments  Date Time Provider  08/25/2021  3:30 PM Unk Pinto, MD  04/21/2022               CPE  2:00 PM Unk Pinto, MD    History of Present Illness:       This very nice 65 y.o. MWM presents for 3 month follow up with HTN, HLD, Pre-Diabetes and Vitamin D Deficiency.  Patient is also followed by Dr Laurier Nancy  and is on Lialda for Ulcerative Colitis.           1 month ago,  patient was evaluated in  the ER with Brain MRI & Cervical CT scans. 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        Labile HTN has been monitored expectantly since 2007.  Todays BP is at goal - 124/78. Patient has had no complaints of any cardiac type chest pain, palpitations, dyspnea / orthopnea / PND, dizziness, claudication or dependent edema.       Hyperlipidemia has not been controlled with diet in the past.  Last Lipids were not at goal :  Lab Results  Component Value Date   CHOL 187 04/16/2020   HDL 34 (L) 04/16/2020   LDLCALC 116 (H) 04/16/2020   TRIG 243 (H) 04/16/2020   CHOLHDL 5.5 (H) 04/16/2020     Also, the patient has been monitored expectantly for glucose intolerance and has had no symptoms of reactive hypoglycemia, diabetic polys, paresthesias or visual blurring.  Last A1c was at goal :  Lab   Component Value Date   HGBA1C 5.4 01/02/2016                                                          Further, the patient also has history of Vitamin D Deficiency ("41" /2008) and supplements vitamin D without any suspected side-effects. Last vitamin D was at  goal :   Lab Results  Component Value Date   VD25OH 82 02/01/2017     Current Outpatient Medications on File Prior to Visit  Medication Sig   aspirin 81 MG tablet Take daily.   VITAMIN D  Take 6,000 Int'l Units  daily.   mesalamine (LIALDA) 1.2 g EC tablet Take 4 tablets (4.8 g total) daily.   Multiple Vitamins-Minerals  Take  1 tab daily   Flaxseed oil  1 capsule daily.   Probiotic  Take 1 capsule daily.     No Known Allergies   PMHx:   Past Medical History:  Diagnosis Date   Anemia    Hyperlipidemia    Internal hemorrhoids    Ulcerative colitis, left sided ([place])    Vitamin D deficiency      Immunization History  Administered Date(s) Administered   Td 07/13/2001     No past surgical history on file.  FHx:    Reviewed / unchanged  SHx:    Reviewed / unchanged   Systems Review:  Constitutional: Denies fever, chills, wt changes, headaches, insomnia, fatigue, night sweats, change in appetite. Eyes: Denies redness, blurred vision, diplopia, discharge, itchy, watery eyes.  ENT: Denies discharge, congestion, post nasal drip, epistaxis, sore throat, earache, hearing loss, dental pain, tinnitus, vertigo, sinus pain, snoring.  CV: Denies  chest pain, palpitations, irregular heartbeat, syncope, dyspnea, diaphoresis, orthopnea, PND, claudication or edema. Respiratory: denies cough, dyspnea, DOE, pleurisy, hoarseness, laryngitis, wheezing.  Gastrointestinal: Denies dysphagia, odynophagia, heartburn, reflux, water brash, abdominal pain or cramps, nausea, vomiting, bloating, diarrhea, constipation, hematemesis, melena, hematochezia  or hemorrhoids. Genitourinary: Denies dysuria, frequency, urgency, nocturia, hesitancy, discharge, hematuria or flank pain. Musculoskeletal: Denies arthralgias, myalgias, stiffness, jt. swelling, pain, limping or strain/sprain.  Skin: Denies pruritus, rash, hives, warts, acne, eczema or change in skin lesion(s). Neuro: No weakness, tremor, incoordination, spasms, paresthesia or pain. Psychiatric: Denies confusion, memory loss or sensory loss. Endo: Denies change in weight, skin or hair change.  Heme/Lymph: No excessive bleeding, bruising or enlarged lymph nodes.  Physical Exam  BP 124/78    Pulse 71    Temp 97.9 F (36.6 C)    Resp 16    Ht 5\' 9"  (1.753 m)    Wt 175 lb 9.6  oz (79.7 kg)    SpO2 95%    BMI 25.93 kg/m   Appears  well nourished  and in no distress.  Eyes: PERRLA, EOMs, conjunctiva no swelling or erythema. Sinuses: No frontal/maxillary tenderness ENT/Mouth: EAC's clear, TM's nl w/o erythema, bulging. Nares clear w/o erythema, swelling, exudates. Oropharynx clear without erythema or exudates. Oral hygiene is good. Tongue normal, non obstructing. Hearing intact.  Neck: Supple. Thyroid not palpable. Car 2+/2+ without bruits, nodes or JVD. Chest: Respirations nl with BS clear & equal w/o rales, rhonchi, wheezing or stridor.  Cor: Heart sounds normal w/ regular rate and rhythm without sig. murmurs, gallops, clicks or rubs. Peripheral pulses normal and equal  without edema.  Abdomen: Soft & bowel sounds normal. Non-tender w/o guarding, rebound, hernias, masses or organomegaly.  Lymphatics: Unremarkable.  Musculoskeletal: Full ROM all peripheral extremities, joint stability, 5/5 strength and normal gait.  Skin: Warm, dry without exposed rashes, lesions or ecchymosis apparent.  Neuro: Cranial nerves intact, reflexes equal bilaterally. Sensory-motor testing grossly intact. Tendon reflexes grossly intact.  Pysch: Alert & oriented x 3.  Insight and judgement nl & appropriate. No ideations.  Assessment and Plan:  1. Labile hypertension  - Continue medication, monitor blood pressure at home.  - Continue DASH diet.  Reminder to go to the ER if any CP,  SOB, nausea, dizziness, severe HA, changes vision/speech.   - CBC with Differential/Platelet - COMPLETE METABOLIC PANEL WITH GFR - Magnesium - TSH  2. Hyperlipidemia, mixed  - Continue diet/meds, exercise,& lifestyle modifications.  - Continue monitor periodic cholesterol/liver & renal functions    - Lipid panel - TSH  3. Abnormal glucose  - Continue diet, exercise  - Lifestyle modifications.  - Monitor appropriate labs   - Hemoglobin A1c - Insulin, random  4. Vitamin D deficiency  -  Continue supplementation   - VITAMIN D 25 Hydroxy  5. Left sided ulcerative (chronic) colitis ([place])   6. Medication management - CBC with Differential/Platelet - COMPLETE METABOLIC PANEL WITH GFR - Magnesium - Lipid panel - TSH - Hemoglobin A1c - Insulin, random - VITAMIN D 25 Hydroxy           Discussed  regular exercise, BP monitoring, weight control to achieve/maintain BMI less than 25 and discussed med and SE's. Recommended labs to assess and monitor clinical status with further disposition pending results of labs.  I discussed the assessment and treatment plan with the patient. The patient was provided an opportunity to ask questions and all were answered. The patient agreed with the plan and demonstrated an understanding  of the instructions.  I provided over 30 minutes of exam, counseling, chart review and  complex critical decision making.         The patient was advised to call back or seek an in-person evaluation if the symptoms worsen or if the condition fails to improve as anticipated.   Kirtland Bouchard, MD

## 2021-08-26 LAB — COMPLETE METABOLIC PANEL WITH GFR
AG Ratio: 2 (calc) (ref 1.0–2.5)
ALT: 14 U/L (ref 9–46)
AST: 14 U/L (ref 10–35)
Albumin: 4.4 g/dL (ref 3.6–5.1)
Alkaline phosphatase (APISO): 65 U/L (ref 35–144)
BUN: 19 mg/dL (ref 7–25)
CO2: 27 mmol/L (ref 20–32)
Calcium: 9.3 mg/dL (ref 8.6–10.3)
Chloride: 106 mmol/L (ref 98–110)
Creat: 1.17 mg/dL (ref 0.70–1.35)
Globulin: 2.2 g/dL (calc) (ref 1.9–3.7)
Glucose, Bld: 90 mg/dL (ref 65–99)
Potassium: 4.2 mmol/L (ref 3.5–5.3)
Sodium: 139 mmol/L (ref 135–146)
Total Bilirubin: 0.5 mg/dL (ref 0.2–1.2)
Total Protein: 6.6 g/dL (ref 6.1–8.1)
eGFR: 69 mL/min/{1.73_m2} (ref >=60)

## 2021-08-26 LAB — LIPID PANEL
Cholesterol: 211 mg/dL — ABNORMAL HIGH (ref <200)
HDL: 48 mg/dL (ref >=40)
LDL Cholesterol (Calc): 135 mg/dL (calc) — ABNORMAL HIGH
Non-HDL Cholesterol (Calc): 163 mg/dL (calc) — ABNORMAL HIGH (ref <130)
Total CHOL/HDL Ratio: 4.4 (calc) (ref <5.0)
Triglycerides: 152 mg/dL — ABNORMAL HIGH (ref <150)

## 2021-08-26 LAB — CBC WITH DIFFERENTIAL/PLATELET
Absolute Monocytes: 479 cells/uL (ref 200–950)
Basophils Absolute: 17 cells/uL (ref 0–200)
Basophils Relative: 0.3 %
Eosinophils Absolute: 57 cells/uL (ref 15–500)
Eosinophils Relative: 1 %
HCT: 40 % (ref 38.5–50.0)
Hemoglobin: 13.4 g/dL (ref 13.2–17.1)
Lymphs Abs: 1756 cells/uL (ref 850–3900)
MCH: 31.4 pg (ref 27.0–33.0)
MCHC: 33.5 g/dL (ref 32.0–36.0)
MCV: 93.7 fL (ref 80.0–100.0)
MPV: 10.2 fL (ref 7.5–12.5)
Monocytes Relative: 8.4 %
Neutro Abs: 3392 cells/uL (ref 1500–7800)
Neutrophils Relative %: 59.5 %
Platelets: 298 10*3/uL (ref 140–400)
RBC: 4.27 10*6/uL (ref 4.20–5.80)
RDW: 12.6 % (ref 11.0–15.0)
Total Lymphocyte: 30.8 %
WBC: 5.7 10*3/uL (ref 3.8–10.8)

## 2021-08-26 LAB — TSH: TSH: 2.21 mIU/L (ref 0.40–4.50)

## 2021-08-26 LAB — MAGNESIUM: Magnesium: 1.9 mg/dL (ref 1.5–2.5)

## 2021-08-26 LAB — HEMOGLOBIN A1C
Hgb A1c MFr Bld: 5.3 % of total Hgb (ref <5.7)
Mean Plasma Glucose: 105 mg/dL
eAG (mmol/L): 5.8 mmol/L

## 2021-08-26 LAB — VITAMIN D 25 HYDROXY (VIT D DEFICIENCY, FRACTURES): Vit D, 25-Hydroxy: 86 ng/mL (ref 30–100)

## 2021-08-26 LAB — INSULIN, RANDOM: Insulin: 7.8 u[IU]/mL

## 2021-08-26 NOTE — Progress Notes (Signed)
=============================================================== °-   Test results slightly outside the reference range are not unusual. If there is anything important, I will review this with you,  otherwise it is considered normal test values.  If you have further questions,  please do not hesitate to contact me at the office or via My Chart.  =============================================================== ===============================================================  -  Total  Chol =  211     - Elevated             (  Ideal  or  Goal is less than 180  !  )   - and   -  Bad / Dangerous LDL  Chol = 135    - also Elevated              (  Ideal  or  Goal is less than 70  !  )   -  - Total Chol =       is very high risk for Heart Attack /Stroke /Vascular Dementia     ( Ideal or Goal is less than 180 ! )  & - Bad /Dangerous LDL Chol =        - - >> Sitting on a time Bomb !     ( Ideal or Goal is less than 70 ! )   - Treating with meds to lower Cholesterol is treating the result                                          & NOT treating the cause - The cause is Bad Diet !  - Read or listen to   Dr Wyman Songster 's book    " How Not to Die ! "   - Recommend a stricter plant based low cholesterol diet   - Cholesterol only comes from animal sources  - ie. meat, dairy, egg yolks  - Eat all the vegetables you want. - Avoid meat, especially red meat - Beef AND Pork . - Avoid cheese & dairy - milk & ice cream.   - Cheese is the most concentrated form of trans-fats which  is the worst thing to clog up our arteries.   - Veggie cheese is OK which can be found in the fresh  produce section at [employer] or [employer] or [employer] ============================================================ ============================================================   - So work on your diet  ! ============================================================ ============================================================   - A1c - Normal - No Diabetes   - Great ! ============================================================ ============================================================   - Vitamin D = 86 - Excellent   ============================================================ ============================================================   - All Else - CBC - Kidneys - Electrolytes - Liver - Magnesium & Thyroid    - all  Normal / OK ============================================================ ============================================================

## 2021-09-29 ENCOUNTER — Encounter: Payer: Self-pay | Admitting: Dermatology

## 2021-09-29 ENCOUNTER — Other Ambulatory Visit: Payer: Self-pay

## 2021-09-29 ENCOUNTER — Ambulatory Visit: Payer: Medicare Other | Admitting: Dermatology

## 2021-09-29 DIAGNOSIS — Z1283 Encounter for screening for malignant neoplasm of skin: Secondary | ICD-10-CM | POA: Diagnosis not present

## 2021-09-29 DIAGNOSIS — L820 Inflamed seborrheic keratosis: Secondary | ICD-10-CM

## 2021-09-29 DIAGNOSIS — L570 Actinic keratosis: Secondary | ICD-10-CM

## 2021-10-12 ENCOUNTER — Encounter: Payer: Self-pay | Admitting: Dermatology

## 2021-10-12 NOTE — Progress Notes (Signed)
? ?  New Patient ?  ?Subjective  ?Jeffrey Gallagher is a 66 y.o. male who presents for the following: Skin Problem (Lesion on left temple x 2 years. Lesion on left upper arm x 2 years. ). ? ?General skin check, several areas which patient pointed out. ?Location:  ?Duration:  ?Quality:  ?Associated Signs/Symptoms: ?Modifying Factors:  ?Severity:  ?Timing: ?Context:  ? ? ?The following portions of the chart were reviewed this encounter and updated as appropriate:  Tobacco  Allergies  Meds  Problems  Med Hx  Surg Hx  Fam Hx   ?  ? ?Objective  ?Well appearing patient in no apparent distress; mood and affect are within normal limits. ?Waist up exam: No atypical pigmented lesions or nonmelanoma skin cancer. ? ?Left Zygomatic Area ?Inflamed pink gritty 4 mm crust ? ?Left Buccal Cheek, Left Upper Arm - Posterior, Right Forearm - Posterior ?Inflamed flattopped pink 6 mm papule ? ? ? ?All skin waist up examined. ? ? ?Assessment & Plan  ?Encounter for screening for malignant neoplasm of skin ? ?Annual skin examination.  Encouraged to self examine with spouse twice annually.  Continue ultraviolet protection. ? ?Actinic keratosis ?Left Zygomatic Area ? ?Destruction of lesion - Left Zygomatic Area ?Complexity: simple   ?Destruction method: cryotherapy   ?Informed consent: discussed and consent obtained   ?Timeout:  patient name, date of birth, surgical site, and procedure verified ?Lesion destroyed using liquid nitrogen: Yes   ?Cryotherapy cycles:  5 ?Outcome: patient tolerated procedure well with no complications   ?Post-procedure details: wound care instructions given   ? ?Inflamed seborrheic keratosis (3) ?Left Upper Arm - Posterior; Right Forearm - Posterior; Left Buccal Cheek ? ?Destruction of lesion - Left Buccal Cheek, Left Upper Arm - Posterior, Right Forearm - Posterior ?Complexity: simple   ?Destruction method: cryotherapy   ?Informed consent: discussed and consent obtained   ?Timeout:  patient name, date of birth,  surgical site, and procedure verified ?Lesion destroyed using liquid nitrogen: Yes   ?Cryotherapy cycles:  5 ?Outcome: patient tolerated procedure well with no complications   ?Post-procedure details: wound care instructions given   ? ? ?

## 2021-12-02 NOTE — Progress Notes (Unsigned)
MEDICARE WELCOME TO MEDICARE AND FOLLOW UP Assessment:   Jeffrey Gallagher was seen today for follow-up and medicare wellness.  Diagnoses and all orders for this visit:  Encounter for initial annual wellness visit (AWV) in Medicare patient Due Yearly  Labile hypertension - continue DASH diet, exercise and monitor at home. Call if greater than 130/80.   -     CBC with Differential/Platelet  Hyperlipidemia, mixed Continue diet and exercise -     COMPLETE METABOLIC PANEL WITH GFR -     Lipid panel  Abnormal glucose Continue diet and exercise  Vitamin D deficiency Currently in range  Left sided ulcerative (chronic) colitis ([facility]) Continue Lialda and follow with Dr. Tarri Glenn  Medication management -     Magnesium  Screening for prostate cancer -     PSA  Overweight (BMI 25.0-29.9) Long discussion about weight loss, diet, and exercise Recommended diet heavy in fruits and veggies and low in animal meats, cheeses, and dairy products, appropriate calorie intake Follow up at next visit   Screening for ischemic heart disease -     EKG 12-Lead  Screening for AAA (abdominal aortic aneurysm) -     [place], RETROPERITNL ABD,  LTD    Over 30 minutes of exam, counseling, chart review, and critical decision making was performed  Future Appointments  Date Time Provider [place]  04/13/2022  8:30 AM Lavonna Monarch, MD [facility]  04/21/2022  2:00 PM Unk Pinto, MD [facility] None     Plan:   During the course of the visit the patient was educated and counseled about appropriate screening and preventive services including:   Pneumococcal vaccine  Influenza vaccine Prevnar 13 Td vaccine Screening electrocardiogram Colorectal cancer screening Diabetes screening Glaucoma screening Nutrition counseling    Subjective:  Jeffrey Gallagher is a 66 y.o. male who presents for Medicare Annual Wellness Visit and 3 month follow up for labile HTN, hyperlipidemia, abnormal glucose, left  sided ulcerative colitis and vitamin D Def.   He is followed by Dr. Tarri Glenn for ulcerative colitis and is on Lialda. Last colonoscopy was 07/22/20  His blood pressure has been controlled at home, today their BP is BP: 120/72 He does workout. He denies chest pain, shortness of breath, dizziness.   BMI is Body mass index is 25.7 kg/m., he has been working on diet and exercise. Wt Readings from Last 3 Encounters:  12/03/21 174 lb (78.9 kg)  08/25/21 175 lb 9.6 oz (79.7 kg)  04/21/21 169 lb 3.2 oz (76.7 kg)     He is not on cholesterol medication and denies myalgias. His cholesterol is not at goal. The cholesterol last visit was:   Lab Results  Component Value Date   CHOL 211 (H) 08/25/2021   HDL 48 08/25/2021   LDLCALC 135 (H) 08/25/2021   TRIG 152 (H) 08/25/2021   CHOLHDL 4.4 08/25/2021   He has been working on diet and exercise for abnormal glucose. Last A1C in the office was:  Lab Results  Component Value Date   HGBA1C 5.3 08/25/2021   Last GFR Lab Results  Component Value Date   EGFR 69 08/25/2021      Patient is not on Vitamin D supplement.   Lab Results  Component Value Date   VD25OH 86 08/25/2021      Medication Review:     Current Outpatient Medications (Analgesics):    aspirin 81 MG tablet, Take 81 mg by mouth daily.   Current Outpatient Medications (Other):    Cholecalciferol (  VITAMIN D PO), Take 6,000 Int'l Units by mouth daily.   mesalamine (LIALDA) 1.2 g EC tablet, Take 4 tablets (4.8 g total) by mouth daily.   Multiple Vitamins-Minerals (MULTIVITAMIN PO), Take by mouth. Take 1 tab daily   OVER THE COUNTER MEDICATION, Flaxseed oil 1 capsule daily.   OVER THE COUNTER MEDICATION, Butyrex   Probiotic Product (PROBIOTIC PO), Take 1 capsule by mouth daily.  Allergies: No Known Allergies  Current Problems (verified) has Vitamin D deficiency; Mixed hyperlipidemia; ULCERATIVE COLITIS, LEFT SIDED; Hemorrhoids; Elevated PSA; Elevated BP; and Medication  management on their problem list.  Screening Tests Immunization History  Administered Date(s) Administered   Td 07/13/2001   Health Maintenance  Topic Date Due   COVID-19 Vaccine (1) 12/19/2021 (Originally 01/21/1956)   Zoster Vaccines- Shingrix (1 of 2) 03/05/2022 (Originally 07/25/1975)   Pneumonia Vaccine 74+ Years old (1 - PCV) 12/04/2022 (Originally 07/24/2021)   TETANUS/TDAP  12/04/2022 (Originally 07/14/2011)   Hepatitis C Screening  12/04/2022 (Originally 07/24/1974)   HIV Screening  12/04/2022 (Originally 07/25/1971)   INFLUENZA VACCINE  02/10/2022   COLONOSCOPY (Pts 45-49yr Insurance coverage will need to be confirmed)  07/23/2027   HPV VACCINES  Aged Out      Preventative care: Last colonoscopy: 2022 Dr. BTarri Glenn  Names of Other Physician/Practitioners you currently use: 1. [facility] here for primary care 2. Dr. SGershon Crane1/2023, eye doctor, last visit  3. Dr TLovena Le4/2023, dentist, last visit  Patient Care Team: MUnk Pinto MD as PCP - General (Internal Medicine) TLavonna Monarch MD as Consulting Physician (Dermatology)  Surgical: He  has no past surgical history on file. Family His family history includes Coronary artery disease in his father; Lung cancer in his mother. Social history  He reports that he quit smoking about 18 years ago. His smoking use included cigarettes. He has never used smokeless tobacco. He reports current alcohol use of about 7.0 standard drinks per week. He reports that he does not use drugs.  MEDICARE WELLNESS OBJECTIVES: Physical activity: Current Exercise Habits: Home exercise routine, Time (Minutes): 60, Frequency (Times/Week): 2, Weekly Exercise (Minutes/Week): 120, Intensity: Mild, Exercise limited by: None identified Cardiac risk factors: Cardiac Risk Factors include: advanced age (>593m, >6>65omen);dyslipidemia;male gender;sedentary lifestyle Depression/mood screen:      12/03/2021    3:58  PM  Depression screen PHQ 2/9  Decreased Interest 0  Down, Depressed, Hopeless 0  PHQ - 2 Score 0    ADLs:     12/03/2021    3:58 PM  In your present state of health, do you have any difficulty performing the following activities:  Hearing? 0  Vision? 0  Difficulty concentrating or making decisions? 0  Walking or climbing stairs? 0  Dressing or bathing? 0  Doing errands, shopping? 0     Cognitive Testing  Alert? Yes  Normal Appearance?Yes  Oriented to person? Yes  Place? Yes   Time? Yes  Recall of three objects?  Yes  Can perform simple calculations? Yes  Displays appropriate judgment?Yes  Can read the correct time from a watch face?Yes  EOL planning: Does Patient Have a Medical Advance Directive?: Yes Type of Advance Directive: Healthcare Power of Attorney, Living will Does patient want to make changes to medical advance directive?: No - Patient declined Copy of [place] Chart?: No - copy requested   Objective:   Today's Vitals   12/03/21 1529  BP: 120/72  Pulse: 68  Temp: 97.7 F (  36.5 C)  SpO2: 96%  Weight: 174 lb (78.9 kg)   Body mass index is 25.7 kg/m.  General appearance: alert, no distress, WD/WN, male HEENT: normocephalic, sclerae anicteric, TMs pearly, nares patent, no discharge or erythema, pharynx normal Oral cavity: MMM, no lesions Neck: supple, no lymphadenopathy, no thyromegaly, no masses Heart: RRR, normal S1, S2, no murmurs Lungs: CTA bilaterally, no wheezes, rhonchi, or rales Abdomen: +bs, soft, non tender, non distended, no masses, no hepatomegaly, no splenomegaly Musculoskeletal: nontender, no swelling, no obvious deformity Extremities: no edema, no cyanosis, no clubbing Pulses: 2+ symmetric, upper and lower extremities, normal cap refill Neurological: alert, oriented x 3, CN2-12 intact, strength normal upper extremities and lower extremities, sensation normal throughout, DTRs 2+ throughout, no cerebellar signs, gait  normal Psychiatric: normal affect, behavior normal, pleasant  EKG: NSR, no ST changes AAA: < 3 cm    Medicare Attestation I have personally reviewed: The patient's medical and social history Their use of alcohol, tobacco or illicit drugs Their current medications and supplements The patient's functional ability including ADLs,fall risks, home safety risks, cognitive, and hearing and visual impairment Diet and physical activities Evidence for depression or mood disorders  The patient's weight, height, BMI, and visual acuity have been recorded in the chart.  I have made referrals, counseling, and provided education to the patient based on review of the above and I have provided the patient with a written personalized care plan for preventive services.     Magda Bernheim, NP   12/03/2021

## 2021-12-03 ENCOUNTER — Ambulatory Visit (INDEPENDENT_AMBULATORY_CARE_PROVIDER_SITE_OTHER): Payer: Medicare Other | Admitting: Nurse Practitioner

## 2021-12-03 ENCOUNTER — Encounter: Payer: Self-pay | Admitting: Nurse Practitioner

## 2021-12-03 VITALS — BP 120/72 | HR 68 | Temp 97.7°F | Wt 174.0 lb

## 2021-12-03 DIAGNOSIS — I700 Atherosclerosis of aorta: Secondary | ICD-10-CM

## 2021-12-03 DIAGNOSIS — R7309 Other abnormal glucose: Secondary | ICD-10-CM

## 2021-12-03 DIAGNOSIS — E782 Mixed hyperlipidemia: Secondary | ICD-10-CM | POA: Diagnosis not present

## 2021-12-03 DIAGNOSIS — Z136 Encounter for screening for cardiovascular disorders: Secondary | ICD-10-CM

## 2021-12-03 DIAGNOSIS — Z0001 Encounter for general adult medical examination with abnormal findings: Secondary | ICD-10-CM

## 2021-12-03 DIAGNOSIS — E559 Vitamin D deficiency, unspecified: Secondary | ICD-10-CM

## 2021-12-03 DIAGNOSIS — Z125 Encounter for screening for malignant neoplasm of prostate: Secondary | ICD-10-CM | POA: Diagnosis not present

## 2021-12-03 DIAGNOSIS — R6889 Other general symptoms and signs: Secondary | ICD-10-CM

## 2021-12-03 DIAGNOSIS — R0989 Other specified symptoms and signs involving the circulatory and respiratory systems: Secondary | ICD-10-CM

## 2021-12-03 DIAGNOSIS — Z79899 Other long term (current) drug therapy: Secondary | ICD-10-CM

## 2021-12-03 DIAGNOSIS — Z0000 Encounter for general adult medical examination without abnormal findings: Secondary | ICD-10-CM

## 2021-12-03 DIAGNOSIS — E663 Overweight: Secondary | ICD-10-CM

## 2021-12-03 DIAGNOSIS — K5150 Left sided colitis without complications: Secondary | ICD-10-CM | POA: Diagnosis not present

## 2021-12-03 NOTE — Progress Notes (Addendum)
Aorta Scan < 3 cm 

## 2021-12-04 LAB — COMPLETE METABOLIC PANEL WITH GFR
AG Ratio: 2 (calc) (ref 1.0–2.5)
ALT: 15 U/L (ref 9–46)
AST: 14 U/L (ref 10–35)
Albumin: 4.2 g/dL (ref 3.6–5.1)
Alkaline phosphatase (APISO): 74 U/L (ref 35–144)
BUN: 19 mg/dL (ref 7–25)
CO2: 24 mmol/L (ref 20–32)
Calcium: 8.9 mg/dL (ref 8.6–10.3)
Chloride: 109 mmol/L (ref 98–110)
Creat: 1.29 mg/dL (ref 0.70–1.35)
Globulin: 2.1 g/dL (calc) (ref 1.9–3.7)
Glucose, Bld: 90 mg/dL (ref 65–99)
Potassium: 4.5 mmol/L (ref 3.5–5.3)
Sodium: 142 mmol/L (ref 135–146)
Total Bilirubin: 0.3 mg/dL (ref 0.2–1.2)
Total Protein: 6.3 g/dL (ref 6.1–8.1)
eGFR: 62 mL/min/{1.73_m2} (ref >=60)

## 2021-12-04 LAB — CBC WITH DIFFERENTIAL/PLATELET
Absolute Monocytes: 608 cells/uL (ref 200–950)
Basophils Absolute: 30 cells/uL (ref 0–200)
Basophils Relative: 0.5 %
Eosinophils Absolute: 83 cells/uL (ref 15–500)
Eosinophils Relative: 1.4 %
HCT: 38.7 % (ref 38.5–50.0)
Hemoglobin: 13.2 g/dL (ref 13.2–17.1)
Lymphs Abs: 1817 cells/uL (ref 850–3900)
MCH: 32 pg (ref 27.0–33.0)
MCHC: 34.1 g/dL (ref 32.0–36.0)
MCV: 93.9 fL (ref 80.0–100.0)
MPV: 10.2 fL (ref 7.5–12.5)
Monocytes Relative: 10.3 %
Neutro Abs: 3363 cells/uL (ref 1500–7800)
Neutrophils Relative %: 57 %
Platelets: 279 10*3/uL (ref 140–400)
RBC: 4.12 10*6/uL — ABNORMAL LOW (ref 4.20–5.80)
RDW: 12.9 % (ref 11.0–15.0)
Total Lymphocyte: 30.8 %
WBC: 5.9 10*3/uL (ref 3.8–10.8)

## 2021-12-04 LAB — PSA: PSA: 1.84 ng/mL (ref <=4.00)

## 2021-12-04 LAB — LIPID PANEL
Cholesterol: 191 mg/dL (ref <200)
HDL: 39 mg/dL — ABNORMAL LOW (ref >=40)
LDL Cholesterol (Calc): 104 mg/dL (calc) — ABNORMAL HIGH
Non-HDL Cholesterol (Calc): 152 mg/dL (calc) — ABNORMAL HIGH (ref <130)
Total CHOL/HDL Ratio: 4.9 (calc) (ref <5.0)
Triglycerides: 359 mg/dL — ABNORMAL HIGH (ref <150)

## 2021-12-04 LAB — MAGNESIUM: Magnesium: 2.1 mg/dL (ref 1.5–2.5)

## 2022-02-02 ENCOUNTER — Encounter: Payer: Self-pay | Admitting: Nurse Practitioner

## 2022-02-02 ENCOUNTER — Ambulatory Visit (INDEPENDENT_AMBULATORY_CARE_PROVIDER_SITE_OTHER): Payer: Medicare Other | Admitting: Nurse Practitioner

## 2022-02-02 VITALS — BP 112/62 | HR 77 | Temp 97.7°F | Ht 69.0 in | Wt 171.8 lb

## 2022-02-02 DIAGNOSIS — R1031 Right lower quadrant pain: Secondary | ICD-10-CM | POA: Diagnosis not present

## 2022-02-02 DIAGNOSIS — R0989 Other specified symptoms and signs involving the circulatory and respiratory systems: Secondary | ICD-10-CM | POA: Diagnosis not present

## 2022-02-02 NOTE — Progress Notes (Signed)
Assessment and Plan:  Jeffrey Gallagher was seen today for abdominal pain.  Diagnoses and all orders for this visit:  Right lower quadrant abdominal pain Please go to the ER if you have any severe AB pain, unable to hold down food/water, blood in stool or vomit, chest pain, shortness of breath, or any worsening symptoms.   -     CT ABDOMEN PELVIS WO CONTRAST; Future -     CBC with Differential/Platelet -     COMPLETE METABOLIC PANEL WITH GFR -     Amylase -     Lipase  Labile hypertension - controlled without medication. Continue DASH diet, exercise and monitor at home. Call if greater than 130/80.        Further disposition pending results of labs. Discussed med's effects and SE's.   Over 30 minutes of exam, counseling, chart review, and critical decision making was performed.   Future Appointments  Date Time Provider [employer]  04/13/2022  8:30 AM Jeffrey Monarch, MD [facility]  04/21/2022  2:00 PM Jeffrey Pinto, MD [facility] None    ------------------------------------------------------------------------------------------------------------------   HPI BP 112/62   Pulse 77   Temp 97.7 F (36.5 C)   Ht '5\' 9"'$  (1.753 m)   Wt 171 lb 12.8 oz (77.9 kg)   SpO2 97%   BMI 25.37 kg/m   66 y.o.male presents for Pain in lower abdomen which he describes as aching.  Denies blood in the stool.  Denies constipation and diarrhea.  Denies nausea and vomiting.  Woke him up this morning with bloating pain , had BM but pain persisted. He does have a history of ulcerative colitis that is currently managed with Lialda. Is followed by Dr. Tarri Gallagher.   BP is currently well controlled without medication.  Denies headaches , chest pain, shortness of breath and dizziness.  BP Readings from Last 3 Encounters:  02/02/22 112/62  12/03/21 120/72  08/25/21 124/78    Past Medical History:  Diagnosis Date   Anemia    Hyperlipidemia    Internal hemorrhoids    Ulcerative colitis, left sided ([facility])     Vitamin D deficiency      No Known Allergies  Current Outpatient Medications on File Prior to Visit  Medication Sig   aspirin 81 MG tablet Take 81 mg by mouth daily.   Cholecalciferol (VITAMIN D PO) Take 6,000 Int'l Units by mouth daily.   mesalamine (LIALDA) 1.2 g EC tablet Take 4 tablets (4.8 g total) by mouth daily.   Multiple Vitamins-Minerals (MULTIVITAMIN PO) Take by mouth. Take 1 tab daily   OVER THE COUNTER MEDICATION Flaxseed oil 1 capsule daily.   OVER THE COUNTER MEDICATION Butyrex   Probiotic Product (PROBIOTIC PO) Take 1 capsule by mouth daily.   No current facility-administered medications on file prior to visit.    ROS: all negative except above.   Physical Exam:  BP 112/62   Pulse 77   Temp 97.7 F (36.5 C)   Ht '5\' 9"'$  (1.753 m)   Wt 171 lb 12.8 oz (77.9 kg)   SpO2 97%   BMI 25.37 kg/m   General Appearance: Well nourished, in no apparent distress. Eyes: PERRLA, EOMs, conjunctiva no swelling or erythema Sinuses: No Frontal/maxillary tenderness ENT/Mouth: Ext aud canals clear, TMs without erythema, bulging. No erythema, swelling, or exudate on post pharynx.  Tonsils not swollen or erythematous. Hearing normal.  Neck: Supple, thyroid normal.  Respiratory: Respiratory effort normal, BS equal bilaterally without rales, rhonchi, wheezing or stridor.  Cardio:  RRR with no MRGs. Brisk peripheral pulses without edema.  Abdomen: Soft, + BS.  Tenderness midline right , no rebound tenderness . Mild tenderness LLQ but much more pronounced on right Lymphatics: Non tender without lymphadenopathy.  Musculoskeletal: Full ROM, 5/5 strength, normal gait.  Skin: Warm, dry without rashes, lesions, ecchymosis.  Neuro: Cranial nerves intact. Normal muscle tone, no cerebellar symptoms. Sensation intact.  Psych: Awake and oriented X 3, normal affect, Insight and Judgment appropriate.     [name], NP 11:42 AM [name]

## 2022-02-02 NOTE — Patient Instructions (Signed)

## 2022-02-03 ENCOUNTER — Ambulatory Visit
Admission: RE | Admit: 2022-02-03 | Discharge: 2022-02-03 | Disposition: A | Discharge status: Home / Self Care | Payer: Medicare Other | Source: Ambulatory Visit | Attending: Nurse Practitioner | Admitting: Nurse Practitioner

## 2022-02-03 DIAGNOSIS — R1031 Right lower quadrant pain: Secondary | ICD-10-CM

## 2022-02-03 DIAGNOSIS — R109 Unspecified abdominal pain: Secondary | ICD-10-CM | POA: Diagnosis not present

## 2022-02-03 LAB — COMPLETE METABOLIC PANEL WITH GFR
AG Ratio: 2 (calc) (ref 1.0–2.5)
ALT: 18 U/L (ref 9–46)
AST: 16 U/L (ref 10–35)
Albumin: 4.6 g/dL (ref 3.6–5.1)
Alkaline phosphatase (APISO): 77 U/L (ref 35–144)
BUN: 15 mg/dL (ref 7–25)
CO2: 28 mmol/L (ref 20–32)
Calcium: 10.7 mg/dL — ABNORMAL HIGH (ref 8.6–10.3)
Chloride: 101 mmol/L (ref 98–110)
Creat: 0.99 mg/dL (ref 0.70–1.35)
Globulin: 2.3 g/dL (calc) (ref 1.9–3.7)
Glucose, Bld: 98 mg/dL (ref 65–99)
Potassium: 4.3 mmol/L (ref 3.5–5.3)
Sodium: 140 mmol/L (ref 135–146)
Total Bilirubin: 0.9 mg/dL (ref 0.2–1.2)
Total Protein: 6.9 g/dL (ref 6.1–8.1)
eGFR: 85 mL/min/{1.73_m2} (ref >=60)

## 2022-02-03 LAB — CBC WITH DIFFERENTIAL/PLATELET
Absolute Monocytes: 714 cells/uL (ref 200–950)
Basophils Absolute: 36 cells/uL (ref 0–200)
Basophils Relative: 0.3 %
Eosinophils Absolute: 24 cells/uL (ref 15–500)
Eosinophils Relative: 0.2 %
HCT: 42.4 % (ref 38.5–50.0)
Hemoglobin: 14.5 g/dL (ref 13.2–17.1)
Lymphs Abs: 928 cells/uL (ref 850–3900)
MCH: 32.3 pg (ref 27.0–33.0)
MCHC: 34.2 g/dL (ref 32.0–36.0)
MCV: 94.4 fL (ref 80.0–100.0)
MPV: 9.9 fL (ref 7.5–12.5)
Monocytes Relative: 6 %
Neutro Abs: 10198 cells/uL — ABNORMAL HIGH (ref 1500–7800)
Neutrophils Relative %: 85.7 %
Platelets: 331 10*3/uL (ref 140–400)
RBC: 4.49 10*6/uL (ref 4.20–5.80)
RDW: 12.7 % (ref 11.0–15.0)
Total Lymphocyte: 7.8 %
WBC: 11.9 10*3/uL — ABNORMAL HIGH (ref 3.8–10.8)

## 2022-02-03 LAB — AMYLASE: Amylase: 48 U/L (ref 21–101)

## 2022-02-03 LAB — LIPASE: Lipase: 5 U/L — ABNORMAL LOW (ref 7–60)

## 2022-02-05 ENCOUNTER — Other Ambulatory Visit: Payer: Self-pay | Admitting: Nurse Practitioner

## 2022-02-05 ENCOUNTER — Encounter (HOSPITAL_COMMUNITY): Payer: Self-pay

## 2022-02-05 ENCOUNTER — Other Ambulatory Visit: Payer: Self-pay

## 2022-02-05 ENCOUNTER — Emergency Department (HOSPITAL_COMMUNITY): Payer: Medicare Other

## 2022-02-05 ENCOUNTER — Observation Stay (HOSPITAL_COMMUNITY)
Admission: EM | Admit: 2022-02-05 | Discharge: 2022-02-06 | Disposition: A | Discharge status: Home / Self Care | Payer: Medicare Other | Attending: General Surgery | Admitting: General Surgery

## 2022-02-05 DIAGNOSIS — R1031 Right lower quadrant pain: Secondary | ICD-10-CM | POA: Diagnosis not present

## 2022-02-05 DIAGNOSIS — K3580 Unspecified acute appendicitis: Principal | ICD-10-CM | POA: Insufficient documentation

## 2022-02-05 DIAGNOSIS — Z7982 Long term (current) use of aspirin: Secondary | ICD-10-CM | POA: Insufficient documentation

## 2022-02-05 DIAGNOSIS — Z79899 Other long term (current) drug therapy: Secondary | ICD-10-CM | POA: Insufficient documentation

## 2022-02-05 DIAGNOSIS — Z87891 Personal history of nicotine dependence: Secondary | ICD-10-CM | POA: Diagnosis not present

## 2022-02-05 LAB — COMPREHENSIVE METABOLIC PANEL
ALT: 18 U/L (ref 0–44)
AST: 19 U/L (ref 15–41)
Albumin: 3.8 g/dL (ref 3.5–5.0)
Alkaline Phosphatase: 68 U/L (ref 38–126)
Anion gap: 6 (ref 5–15)
BUN: 16 mg/dL (ref 8–23)
CO2: 27 mmol/L (ref 22–32)
Calcium: 9.1 mg/dL (ref 8.9–10.3)
Chloride: 107 mmol/L (ref 98–111)
Creatinine, Ser: 1.1 mg/dL (ref 0.61–1.24)
GFR, Estimated: >60 mL/min (ref >60)
Glucose, Bld: 105 mg/dL — ABNORMAL HIGH (ref 70–99)
Potassium: 4.2 mmol/L (ref 3.5–5.1)
Sodium: 140 mmol/L (ref 135–145)
Total Bilirubin: 0.7 mg/dL (ref 0.3–1.2)
Total Protein: 6.5 g/dL (ref 6.5–8.1)

## 2022-02-05 LAB — URINALYSIS, ROUTINE W REFLEX MICROSCOPIC
Bilirubin Urine: NEGATIVE
Glucose, UA: NEGATIVE mg/dL
Hgb urine dipstick: NEGATIVE
Ketones, ur: NEGATIVE mg/dL
Leukocytes,Ua: NEGATIVE
Nitrite: NEGATIVE
Protein, ur: NEGATIVE mg/dL
Specific Gravity, Urine: 1.04 — ABNORMAL HIGH (ref 1.005–1.030)
pH: 6 (ref 5.0–8.0)

## 2022-02-05 LAB — CBC
HCT: 40.5 % (ref 39.0–52.0)
Hemoglobin: 13.4 g/dL (ref 13.0–17.0)
MCH: 31.7 pg (ref 26.0–34.0)
MCHC: 33.1 g/dL (ref 30.0–36.0)
MCV: 95.7 fL (ref 80.0–100.0)
Platelets: 313 10*3/uL (ref 150–400)
RBC: 4.23 MIL/uL (ref 4.22–5.81)
RDW: 12.9 % (ref 11.5–15.5)
WBC: 5.9 10*3/uL (ref 4.0–10.5)
nRBC: 0 % (ref 0.0–0.2)

## 2022-02-05 LAB — LIPASE, BLOOD: Lipase: 27 U/L (ref 11–51)

## 2022-02-05 MED ORDER — HYDRALAZINE HCL 20 MG/ML IJ SOLN: 10.0000 mg | INTRAMUSCULAR | Status: DC | PRN

## 2022-02-05 MED ORDER — SIMETHICONE 80 MG PO CHEW
40.0000 mg | CHEWABLE_TABLET | Freq: Four times a day (QID) | ORAL | Status: DC | PRN
  Administered 2022-02-06: 40 mg via ORAL
  Filled 2022-02-05: qty 1

## 2022-02-05 MED ORDER — METHOCARBAMOL 1000 MG/10ML IJ SOLN
500.0000 mg | Freq: Three times a day (TID) | INTRAVENOUS | Status: DC | PRN
  Filled 2022-02-05: qty 5

## 2022-02-05 MED ORDER — METRONIDAZOLE 500 MG/100ML IV SOLN
500.0000 mg | Freq: Two times a day (BID) | INTRAVENOUS | Status: DC
  Administered 2022-02-05: 500 mg via INTRAVENOUS
  Filled 2022-02-05: qty 100

## 2022-02-05 MED ORDER — OXYCODONE HCL 5 MG PO TABS: 5.0000 mg | ORAL_TABLET | ORAL | Status: DC | PRN

## 2022-02-05 MED ORDER — MESALAMINE 1.2 G PO TBEC
4.8000 g | DELAYED_RELEASE_TABLET | Freq: Every day | ORAL | Status: DC
  Administered 2022-02-06: 4.8 g via ORAL
  Filled 2022-02-05: qty 4

## 2022-02-05 MED ORDER — IOHEXOL 300 MG/ML  SOLN
100.0000 mL | Freq: Once | INTRAMUSCULAR | Status: AC | PRN
  Administered 2022-02-05: 100 mL via INTRAVENOUS

## 2022-02-05 MED ORDER — ACETAMINOPHEN 325 MG PO TABS: 650.0000 mg | ORAL_TABLET | Freq: Four times a day (QID) | ORAL | Status: DC | PRN

## 2022-02-05 MED ORDER — ACETAMINOPHEN 650 MG RE SUPP: 650.0000 mg | Freq: Four times a day (QID) | RECTAL | Status: DC | PRN

## 2022-02-05 MED ORDER — ONDANSETRON HCL 4 MG/2ML IJ SOLN
4.0000 mg | Freq: Four times a day (QID) | INTRAMUSCULAR | Status: DC | PRN
  Filled 2022-02-05: qty 2

## 2022-02-05 MED ORDER — MORPHINE SULFATE (PF) 2 MG/ML IV SOLN
2.0000 mg | INTRAVENOUS | Status: DC | PRN
  Filled 2022-02-05: qty 1

## 2022-02-05 MED ORDER — DIPHENHYDRAMINE HCL 50 MG/ML IJ SOLN: 12.5000 mg | Freq: Four times a day (QID) | INTRAMUSCULAR | Status: DC | PRN

## 2022-02-05 MED ORDER — SODIUM CHLORIDE 0.9 % IV SOLN
2.0000 g | INTRAVENOUS | Status: DC
  Administered 2022-02-05: 2 g via INTRAVENOUS
  Filled 2022-02-05: qty 20

## 2022-02-05 MED ORDER — METHOCARBAMOL 500 MG PO TABS: 500.0000 mg | ORAL_TABLET | Freq: Three times a day (TID) | ORAL | Status: DC | PRN

## 2022-02-05 MED ORDER — IBUPROFEN 600 MG PO TABS: 600.0000 mg | ORAL_TABLET | Freq: Four times a day (QID) | ORAL | Status: DC | PRN

## 2022-02-05 MED ORDER — ENOXAPARIN SODIUM 40 MG/0.4ML IJ SOSY
40.0000 mg | PREFILLED_SYRINGE | INTRAMUSCULAR | Status: DC
  Administered 2022-02-05: 40 mg via SUBCUTANEOUS
  Filled 2022-02-05: qty 0.4

## 2022-02-05 MED ORDER — DIPHENHYDRAMINE HCL 12.5 MG/5ML PO ELIX: 12.5000 mg | ORAL_SOLUTION | Freq: Four times a day (QID) | ORAL | Status: DC | PRN

## 2022-02-05 NOTE — ED Provider Triage Note (Signed)
Emergency Medicine Provider Triage Evaluation Note  Jeffrey Gallagher , a 66 y.o. male  was evaluated in triage.  Pt complains of right lower quadrant abdominal pain since Monday.  Patient was seen at his PCP on 7/24, had labs drawn which showed elevated white blood cell count.  Patient had outpatient CT scan done on 7/25 which showed dilated appendix, mild fat stranding.  Per radiologist interpretation, "indeterminate but could represent early acute appendicitis".  Patient was called by PCP and advised to present to ED for further evaluation.  Patient denies any fevers, nausea, vomiting, diarrhea, body aches or chills.  Review of Systems  Positive:  Negative:   Physical Exam  BP 132/78 (BP Location: Right Arm)   Pulse 63   Temp 98.5 F (36.9 C) (Oral)   Resp 17   SpO2 97%  Gen:   Awake, no distress   Resp:  Normal effort  MSK:   Moves extremities without difficulty  Other:  No appreciable right lower quadrant tenderness on examination  Medical Decision Making  Medically screening exam initiated at 10:14 AM.  Appropriate orders placed.  Jeffrey Gallagher was informed that the remainder of the evaluation will be completed by another provider, this initial triage assessment does not replace that evaluation, and the importance of remaining in the ED until their evaluation is complete.  Work-up initiated   Azucena Cecil, [place] 02/05/22 1015

## 2022-02-05 NOTE — ED Triage Notes (Signed)
Pt reports on going RLQ pain and bloating. Pt was seen by provider recently. CT completed with possibility of appendicitis shown on scan.    HX: Colitis

## 2022-02-05 NOTE — ED Provider Notes (Signed)
[facility] EMERGENCY DEPARTMENT Provider Note   CSN: [id number] Arrival date & time: 02/05/22  0102     History  Chief Complaint  Patient presents with   Abdominal Pain    Jeffrey Gallagher is a 66 y.o. male.   Abdominal Pain  Patient with history of ulcerative colitis.  Presents abdominal pain over the last around 4 days.  Went to see PCP a few days ago and had a CT scan that showed questionable appendicitis.  Continued pain.  States it initially felt like his ulcerative colitis flare.  Not having fevers.  Somewhat decreased appetite but states he did eat a handful of peanuts about half hour prior to getting here.  CT scan done and repeated today and shows likely appendicitis.  Pain more right abdomen at this time.  No fevers.  Has had recent decrease in his mesalamine for his ulcerative colitis.   Past Medical History:  Diagnosis Date   Anemia    Hyperlipidemia    Internal hemorrhoids    Ulcerative colitis, left sided ([place])    Vitamin D deficiency     Home Medications Prior to Admission medications   Medication Sig Start Date End Date Taking? Authorizing Provider  aspirin 81 MG tablet Take 81 mg by mouth daily.    [provider]  Cholecalciferol (VITAMIN D PO) Take 6,000 Int'l Units by mouth daily.    [provider]  mesalamine (LIALDA) 1.2 g EC tablet Take 4 tablets (4.8 g total) by mouth daily. 02/28/21   Thornton Park, MD  Multiple Vitamins-Minerals (MULTIVITAMIN PO) Take by mouth. Take 1 tab daily    [provider]  OVER THE COUNTER MEDICATION Flaxseed oil 1 capsule daily.    [provider]  OVER THE COUNTER MEDICATION Butyrex    [provider]  Probiotic Product (PROBIOTIC PO) Take 1 capsule by mouth daily.    [provider]      Allergies    Patient has no known allergies.    Review of Systems   Review of Systems  Gastrointestinal:  Positive for abdominal pain.    Physical  Exam Updated Vital Signs BP 112/72 (BP Location: Left Arm)   Pulse 65   Temp 98.1 F (36.7 C) (Oral)   Resp 17   SpO2 97%  Physical Exam Vitals and nursing note reviewed.  Cardiovascular:     Rate and Rhythm: Normal rate.  Abdominal:     Tenderness: There is abdominal tenderness.     Comments: Right lower quadrant tenderness without rebound or guarding.  No hernia palpated.  Skin:    General: Skin is warm.     Capillary Refill: Capillary refill takes less than 2 seconds.  Neurological:     Mental Status: He is alert.     ED Results / Procedures / Treatments   Labs (all labs ordered are listed, but only abnormal results are displayed) Labs Reviewed  COMPREHENSIVE METABOLIC PANEL - Abnormal; Notable for the following components:      Result Value   Glucose, Bld 105 (*)    All other components within normal limits  LIPASE, BLOOD  CBC  URINALYSIS, ROUTINE W REFLEX MICROSCOPIC  HIV ANTIBODY (ROUTINE TESTING W REFLEX)  BASIC METABOLIC PANEL  CBC    EKG None  Radiology CT ABDOMEN PELVIS W CONTRAST  Result Date: 02/05/2022 CLINICAL DATA:  Right lower quadrant pain. EXAM: CT ABDOMEN AND PELVIS WITH CONTRAST TECHNIQUE: Multidetector CT imaging of the abdomen and pelvis  was performed using the standard protocol following bolus administration of intravenous contrast. RADIATION DOSE REDUCTION: This exam was performed according to the departmental dose-optimization program which includes automated exposure control, adjustment of the mA and/or kV according to patient size and/or use of iterative reconstruction technique. CONTRAST:  174m OMNIPAQUE IOHEXOL 300 MG/ML  SOLN COMPARISON:  CT abdomen/pelvis 02/04/2019 FINDINGS: Lower chest: Lung bases are clear.  Imaged heart is unremarkable. Hepatobiliary: The liver and gallbladder are unremarkable. There is no biliary ductal dilatation. Pancreas: Unremarkable. Spleen: Unremarkable. Adrenals/Urinary Tract: Adrenals are unremarkable. The  kidneys are unremarkable, with no focal lesion, stone, hydronephrosis, or hydroureter. There is symmetric excretion of contrast into the collecting systems on the delayed images. 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 Stomach/Bowel: The stomach is unremarkable. There is no evidence of bowel obstruction. The appendix is mildly dilated measuring up to 8 mm distally. There is mucosal hyperemia and mild inflammatory fat stranding. There is no appendicolith. There is no evidence of perforation. Findings are overall similar to the prior study. Vascular/Lymphatic: There is mild calcified atherosclerotic plaque in the nonaneurysmal abdominal aorta. The major branch vessels are patent. The main portal and splenic veins are patent. Incidental note is made of a circumaortic left renal vein. There is no abdominal or pelvic lymphadenopathy. Reproductive: The prostate and seminal vesicles are unremarkable. Other: There is no ascites or free air. There is no evidence of abscess in the abdomen or pelvis. Musculoskeletal: There is no acute osseous abnormality or suspicious osseous lesion. IMPRESSION: 1. Mild dilation and mucosal hyperemia in the appendix with mild surrounding inflammatory fat stranding again suggestive of mild/early acute appendicitis. No evidence of perforation or abscess formation. Correlate with physical exam and laboratory values. 2. 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 Electronically Signed   By: PValetta MoleM.D.   On: 02/05/2022 13:23    Procedures Procedures    Medications Ordered in ED Medications  cefTRIAXone (ROCEPHIN) 2 g in sodium chloride 0.9 % 100 mL IVPB (0 g Intravenous Stopped 02/05/22 1720)  metroNIDAZOLE (FLAGYL) IVPB 500 mg (500 mg Intravenous New Bag/Given 02/05/22 1727)  morphine (PF) 2 MG/ML injection 2 mg [facility]venous [facility]  ondansetron (ZOFRAN) injection 4 mg (0 mg Intravenous [facility]  enoxaparin (LOVENOX) injection 40 mg (40 mg Subcutaneous Given 02/05/22 1732)  acetaminophen (TYLENOL) tablet 650 mg (has no administration in time range)    Or  acetaminophen (TYLENOL) suppository 650 mg (has no administration in time range)  ibuprofen (ADVIL) tablet 600 mg (has no administration in time range)  oxyCODONE (Oxy IR/ROXICODONE) immediate release tablet 5-10 mg (has no administration in time range)  diphenhydrAMINE (BENADRYL) 12.5 MG/5ML elixir 12.5 mg (has no administration in time range)    Or  diphenhydrAMINE (BENADRYL) injection 12.5 mg (has no administration in time range)  methocarbamol (ROBAXIN) tablet 500 mg (has no administration in time range)    Or  methocarbamol (ROBAXIN) 500 mg in dextrose 5 % 50 mL IVPB (has no administration in time range)  simethicone (MYLICON) chewable tablet 40 mg (has no administration in time range)  hydrALAZINE (APRESOLINE) injection 10 mg (has no administration in time range)  mesalamine (LIALDA) EC tablet 4.8 g (has no administration in time range)  iohexol (OMNIPAQUE) 300 MG/ML solution 100 mL (100 mLs Intravenous Contrast Given 02/05/22 1305)    ED Course/ Medical Decision Making/ A&P  Medical Decision Making Amount and/or Complexity of Data Reviewed Labs: ordered.  Risk Decision regarding hospitalization.   Patient with abdominal pain.  Had CT scan a couple days ago and one today that showed likely progressing mild appendicitis.  Story is consistent with it.  Lab work reviewed and elevated white count a couple days ago but improved now.  Independently interpreted the CT scans.  Discussed with general surgery will see patient and likely admit.  Does not appear septic at this time.        Final Clinical Impression(s) / ED Diagnoses Final diagnoses:  Acute appendicitis, unspecified acute appendicitis type    Rx / DC Orders ED Discharge Orders     None          Davonna Belling, MD 02/05/22 1745

## 2022-02-05 NOTE — ED Notes (Addendum)
Cricket called by this RN to update [facility] contact information for SBAR handoff.

## 2022-02-05 NOTE — H&P (Signed)
[facility] Admission Note  Leeroy Lovings [facility] [birth year]  [id number].    Requesting MD: Dr. Davonna Belling Chief Complaint/Reason for Consult: RLQ pain  HPI:  MCIHAEL Gallagher is a 66 y.o. male with a hx of UC managed by Dr. Tarri Glenn on [place] who presented to the [facility] ED for RLQ abdominal pain.  Pain began on 7/24 with associated bloating.  States that the pain was initially across his abdomen, but now just more in the RLQ. No fever, chills, nausea or vomiting. He saw his pcp and had an outpatient scan read yesterday that showed dilated appendix measuring 12 mm with question of mild fat stranding adjacent to this.  He presented to the ED today for evaluation. Afebrile here without tachycardia or hypotension. WBC wnl. Repeat CT w/ mild dilation and mucosal hyperemia in the appendix with mild surrounding inflammatory fat stranding suggestive of mild/early acute appendicitis without evidence of perforation or abscess.  We were asked to see.  Last PO intake: peanuts at 1530 PMH significant for: Ulcerative colitis, anemia, HLD Abdominal surgical history: None Anticoagulants: None Tbcc: Former smoker, quit 2004 Alc: 2 drinks/day Employment: Chief Financial Officer  ROS: ROS As above, see HPI  Family History  Problem Relation Age of Onset   Coronary artery disease Father    Lung cancer Mother    Colon cancer Neg Hx    Esophageal cancer Neg Hx    Stomach cancer Neg Hx    Rectal cancer Neg Hx     Past Medical History:  Diagnosis Date   Anemia    Hyperlipidemia    Internal hemorrhoids    Ulcerative colitis, left sided ([place])    Vitamin D deficiency     No past surgical history on file.  Social History:  reports that he quit smoking about 18 years ago. His smoking use included cigarettes. He has never used smokeless tobacco. He reports current alcohol use of about 7.0 standard drinks of alcohol per week. He reports that he does not use drugs.  Allergies: No Known Allergies  (Not in a  hospital admission)   Prior to Admission medications   Medication Sig Start Date End Date Taking? Authorizing Provider  aspirin 81 MG tablet Take 81 mg by mouth daily.    [provider]  Cholecalciferol (VITAMIN D PO) Take 6,000 Int'l Units by mouth daily.    [provider]  mesalamine (LIALDA) 1.2 g EC tablet Take 4 tablets (4.8 g total) by mouth daily. 02/28/21   Thornton Park, MD  Multiple Vitamins-Minerals (MULTIVITAMIN PO) Take by mouth. Take 1 tab daily    [provider]  OVER THE COUNTER MEDICATION Flaxseed oil 1 capsule daily.    [provider]  OVER THE COUNTER MEDICATION Butyrex    [provider]  Probiotic Product (PROBIOTIC PO) Take 1 capsule by mouth daily.    [provider]    Blood pressure 112/72, pulse 65, temperature 98.1 F (36.7 C), temperature source Oral, resp. rate 17, SpO2 97 %. Physical Exam: General: pleasant, WD/WN male who is laying in bed in NAD HEENT: head is normocephalic, atraumatic.  Sclera are noninjected.  Pupils equal and round.  Ears and nose without any masses or lesions.  Mouth is pink and moist. Dentition fair Heart: regular, rate, and rhythm.  Normal s1,s2. No obvious murmurs, gallops, or rubs noted.  Palpable radial and pedal pulses bilaterally  Lungs: CTAB, no wheezes, rhonchi, or rales noted.  Respiratory effort nonlabored Abd: Soft, ND, mild focal  TTP RLQ without rebound or guarding, +BS, no masses, hernias, or organomegaly MS: no BUE/BLE edema, calves soft and nontender Skin: warm and dry with no masses, lesions, or rashes Psych: A&Ox4 with an appropriate affect Neuro: MAEs, no gross motor or sensory deficits BUE/BLE  Results for orders placed or performed during the hospital encounter of 02/05/22 (from the past 48 hour(s))  Lipase, blood     Status: None   Collection Time: 02/05/22 10:24 AM  Result Value Ref Range   Lipase 27 11 - 51 U/L    Comment: Performed at [facility], [place] [address], [place], [place] [address]  Comprehensive metabolic panel     Status: Abnormal   Collection Time: 02/05/22 10:24 AM  Result Value Ref Range   Sodium 140 135 - 145 mmol/L   Potassium 4.2 3.5 - 5.1 mmol/L   Chloride 107 98 - 111 mmol/L   CO2 27 22 - 32 mmol/L   Glucose, Bld 105 (H) 70 - 99 mg/dL    Comment: Glucose reference range applies only to samples taken after fasting for at least 8 hours.   BUN 16 8 - 23 mg/dL   Creatinine, Ser 1.10 0.61 - 1.24 mg/dL   Calcium 9.1 8.9 - 10.3 mg/dL   Total Protein 6.5 6.5 - 8.1 g/dL   Albumin 3.8 3.5 - 5.0 g/dL   AST 19 15 - 41 U/L   ALT 18 0 - 44 U/L   Alkaline Phosphatase 68 38 - 126 U/L   Total Bilirubin 0.7 0.3 - 1.2 mg/dL   GFR, Estimated >60 >60 mL/min    Comment: (NOTE) Calculated using the CKD-EPI Creatinine Equation (2021)    Anion gap 6 5 - 15    Comment: Performed at [facility] [address], [place], [place] [address]  CBC     Status: None   Collection Time: 02/05/22 10:24 AM  Result Value Ref Range   WBC 5.9 4.0 - 10.5 K/uL   RBC 4.23 4.22 - 5.81 MIL/uL   Hemoglobin 13.4 13.0 - 17.0 g/dL   HCT 40.5 39.0 - 52.0 %   MCV 95.7 80.0 - 100.0 fL   MCH 31.7 26.0 - 34.0 pg   MCHC 33.1 30.0 - 36.0 g/dL   RDW 12.9 11.5 - 15.5 %   Platelets 313 150 - 400 K/uL   nRBC 0.0 0.0 - 0.2 %    Comment: Performed at [facility], [place] [address] [place], [place] [address]   CT ABDOMEN PELVIS W CONTRAST  Result Date: 02/05/2022 CLINICAL DATA:  Right lower quadrant pain. EXAM: CT ABDOMEN AND PELVIS WITH CONTRAST TECHNIQUE: Multidetector CT imaging of the abdomen and pelvis was performed using the standard protocol following bolus administration of intravenous contrast. RADIATION DOSE REDUCTION: This exam was performed according to the departmental dose-optimization program which includes automated exposure control, adjustment of the mA and/or kV according to patient size and/or use of iterative  reconstruction technique. CONTRAST:  140m OMNIPAQUE IOHEXOL 300 MG/ML  SOLN COMPARISON:  CT abdomen/pelvis 02/04/2019 FINDINGS: Lower chest: Lung bases are clear.  Imaged heart is unremarkable. Hepatobiliary: The liver and gallbladder are unremarkable. There is no biliary ductal dilatation. Pancreas: Unremarkable. Spleen: Unremarkable. Adrenals/Urinary Tract: Adrenals are unremarkable. The kidneys are unremarkable, with no focal lesion, stone, hydronephrosis, or hydroureter. There is symmetric excretion of contrast into the collecting systems on the delayed images. 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 Stomach/Bowel: The stomach is unremarkable.  There is no evidence of bowel obstruction. The appendix is mildly dilated measuring up to 8 mm distally. There is mucosal hyperemia and mild inflammatory fat stranding. There is no appendicolith. There is no evidence of perforation. Findings are overall similar to the prior study. Vascular/Lymphatic: There is mild calcified atherosclerotic plaque in the nonaneurysmal abdominal aorta. The major branch vessels are patent. The main portal and splenic veins are patent. Incidental note is made of a circumaortic left renal vein. There is no abdominal or pelvic lymphadenopathy. Reproductive: The prostate and seminal vesicles are unremarkable. Other: There is no ascites or free air. There is no evidence of abscess in the abdomen or pelvis. Musculoskeletal: There is no acute osseous abnormality or suspicious osseous lesion. IMPRESSION: 1. Mild dilation and mucosal hyperemia in the appendix with mild surrounding inflammatory fat stranding again suggestive of mild/early acute appendicitis. No evidence of perforation or abscess formation. Correlate with physical exam and laboratory values. 2. 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 Electronically Signed   By: Valetta Mole M.D.   On: 02/05/2022 13:23    Anti-infectives  (From admission, onward)    Start     Dose/Rate Route Frequency Ordered Stop   02/05/22 1630  cefTRIAXone (ROCEPHIN) 2 g in sodium chloride 0.9 % 100 mL IVPB        2 g 200 mL/hr over 30 Minutes Intravenous Every 24 hours 02/05/22 1619     02/05/22 1630  metroNIDAZOLE (FLAGYL) IVPB 500 mg        500 mg 100 mL/hr over 60 Minutes Intravenous Every 8 hours 02/05/22 1619          Assessment/Plan Acute Appendicitis  Patient has been seen and examined.  Patient's history and imaging consistent with acute appendicitis. No evidence of perforation or abscess on CT. Discussed operative vs non-operative intervention.  I have explained the procedure, risks, and aftercare of Laparoscopic Appendectomy.  Risks include but are not limited to anesthesia (MI, CVA, death), bleeding, infection, injury to surrounding structures (viscus, nerves, blood vessels, ureter), need for conversion to open procedure or ileocecectomy, post operative ileus or abscess and increased risk of DVT/PE.  He seems to understand and agrees to proceed with surgery. Given that he just ate something and he is nontoxic appearing, will plan for surgery first thing in the morning. Admit to observation. Start IV abx.   ID - Rocephin/Flagyl VTE - SCDs FEN - NPO, IVF Foley - None Dispo - Admit to med-surg for observation   UC managed by Dr. Tarri Glenn on [place]    I reviewed ED provider notes, last 24 h vitals and pain scores, last 48 h intake and output, last 24 h labs and trends, and last 24 h imaging results.  Margie Billet, PA-C [facility] 02/05/2022, 4:06 PM Please see Amion for pager number during day hours 7:00am-4:30pm

## 2022-02-06 ENCOUNTER — Other Ambulatory Visit: Payer: Self-pay

## 2022-02-06 ENCOUNTER — Observation Stay (HOSPITAL_COMMUNITY): Payer: Medicare Other | Admitting: Critical Care Medicine

## 2022-02-06 ENCOUNTER — Encounter (HOSPITAL_COMMUNITY): Payer: Self-pay

## 2022-02-06 ENCOUNTER — Encounter (HOSPITAL_COMMUNITY)
Admission: EM | Disposition: A | Discharge status: Home / Self Care | Payer: Self-pay | Source: Home / Self Care | Attending: Emergency Medicine

## 2022-02-06 ENCOUNTER — Observation Stay (HOSPITAL_BASED_OUTPATIENT_CLINIC_OR_DEPARTMENT_OTHER): Payer: Medicare Other | Admitting: Critical Care Medicine

## 2022-02-06 DIAGNOSIS — K3580 Unspecified acute appendicitis: Secondary | ICD-10-CM | POA: Diagnosis not present

## 2022-02-06 HISTORY — PX: LAPAROSCOPIC APPENDECTOMY: SHX408

## 2022-02-06 LAB — BASIC METABOLIC PANEL
Anion gap: 7 (ref 5–15)
BUN: 13 mg/dL (ref 8–23)
CO2: 27 mmol/L (ref 22–32)
Calcium: 9 mg/dL (ref 8.9–10.3)
Chloride: 105 mmol/L (ref 98–111)
Creatinine, Ser: 1.17 mg/dL (ref 0.61–1.24)
GFR, Estimated: >60 mL/min (ref >60)
Glucose, Bld: 100 mg/dL — ABNORMAL HIGH (ref 70–99)
Potassium: 4.3 mmol/L (ref 3.5–5.1)
Sodium: 139 mmol/L (ref 135–145)

## 2022-02-06 LAB — HIV ANTIBODY (ROUTINE TESTING W REFLEX): HIV Screen 4th Generation wRfx: NONREACTIVE

## 2022-02-06 LAB — CBC
HCT: 39.9 % (ref 39.0–52.0)
Hemoglobin: 13.2 g/dL (ref 13.0–17.0)
MCH: 31.7 pg (ref 26.0–34.0)
MCHC: 33.1 g/dL (ref 30.0–36.0)
MCV: 95.9 fL (ref 80.0–100.0)
Platelets: 355 10*3/uL (ref 150–400)
RBC: 4.16 MIL/uL — ABNORMAL LOW (ref 4.22–5.81)
RDW: 12.9 % (ref 11.5–15.5)
WBC: 5.8 10*3/uL (ref 4.0–10.5)
nRBC: 0 % (ref 0.0–0.2)

## 2022-02-06 LAB — SURGICAL PCR SCREEN
MRSA, PCR: POSITIVE — AB
Staphylococcus aureus: POSITIVE — AB

## 2022-02-06 SURGERY — APPENDECTOMY, LAPAROSCOPIC
Anesthesia: General | Site: Abdomen

## 2022-02-06 MED ORDER — ONDANSETRON HCL 4 MG/2ML IJ SOLN: 4.0000 mg | Freq: Four times a day (QID) | INTRAMUSCULAR | Status: DC | PRN

## 2022-02-06 MED ORDER — KETOROLAC TROMETHAMINE 30 MG/ML IJ SOLN
INTRAMUSCULAR | Status: DC | PRN
  Administered 2022-02-06: 30 mg via INTRAVENOUS

## 2022-02-06 MED ORDER — PROPOFOL 10 MG/ML IV BOLUS
INTRAVENOUS | Status: AC
  Filled 2022-02-06: qty 20

## 2022-02-06 MED ORDER — PHENYLEPHRINE 80 MCG/ML (10ML) SYRINGE FOR IV PUSH (FOR BLOOD PRESSURE SUPPORT)
PREFILLED_SYRINGE | INTRAVENOUS | Status: AC
  Filled 2022-02-06: qty 10

## 2022-02-06 MED ORDER — OXYCODONE HCL 5 MG PO TABS: 5.0000 mg | ORAL_TABLET | Freq: Four times a day (QID) | ORAL | 0 refills | Status: DC | PRN

## 2022-02-06 MED ORDER — LACTATED RINGERS IV SOLN: INTRAVENOUS | Status: DC | PRN

## 2022-02-06 MED ORDER — DEXAMETHASONE SODIUM PHOSPHATE 10 MG/ML IJ SOLN
INTRAMUSCULAR | Status: AC
  Filled 2022-02-06: qty 1

## 2022-02-06 MED ORDER — LIDOCAINE 2% (20 MG/ML) 5 ML SYRINGE
INTRAMUSCULAR | Status: DC | PRN
  Administered 2022-02-06: 60 mg via INTRAVENOUS

## 2022-02-06 MED ORDER — BUPIVACAINE-EPINEPHRINE (PF) 0.25% -1:200000 IJ SOLN
INTRAMUSCULAR | Status: AC
  Filled 2022-02-06: qty 30

## 2022-02-06 MED ORDER — KETOROLAC TROMETHAMINE 30 MG/ML IJ SOLN
INTRAMUSCULAR | Status: AC
  Filled 2022-02-06: qty 1

## 2022-02-06 MED ORDER — DEXMEDETOMIDINE HCL IN NACL 80 MCG/20ML IV SOLN
INTRAVENOUS | Status: AC
  Filled 2022-02-06: qty 20

## 2022-02-06 MED ORDER — ROCURONIUM BROMIDE 10 MG/ML (PF) SYRINGE
PREFILLED_SYRINGE | INTRAVENOUS | Status: AC
  Filled 2022-02-06: qty 10

## 2022-02-06 MED ORDER — ONDANSETRON HCL 4 MG/2ML IJ SOLN
INTRAMUSCULAR | Status: AC
Start: 2022-02-06 — End: ?
  Filled 2022-02-06: qty 2

## 2022-02-06 MED ORDER — PHENYLEPHRINE HCL-NACL 20-0.9 MG/250ML-% IV SOLN
INTRAVENOUS | Status: DC | PRN
  Administered 2022-02-06: 25 ug/min via INTRAVENOUS

## 2022-02-06 MED ORDER — OXYCODONE HCL 5 MG/5ML PO SOLN: 5.0000 mg | Freq: Once | ORAL | Status: DC | PRN

## 2022-02-06 MED ORDER — SODIUM CHLORIDE 0.9 % IR SOLN
Status: DC | PRN
  Administered 2022-02-06: 1

## 2022-02-06 MED ORDER — DEXAMETHASONE SODIUM PHOSPHATE 10 MG/ML IJ SOLN
INTRAMUSCULAR | Status: DC | PRN
  Administered 2022-02-06: 4 mg via INTRAVENOUS

## 2022-02-06 MED ORDER — MUPIROCIN 2 % EX OINT
1.0000 | TOPICAL_OINTMENT | Freq: Two times a day (BID) | CUTANEOUS | Status: DC
  Administered 2022-02-06: 1 via NASAL
  Filled 2022-02-06: qty 22

## 2022-02-06 MED ORDER — 0.9 % SODIUM CHLORIDE (POUR BTL) OPTIME
TOPICAL | Status: DC | PRN
  Administered 2022-02-06: 1000 mL

## 2022-02-06 MED ORDER — MIDAZOLAM HCL 2 MG/2ML IJ SOLN
INTRAMUSCULAR | Status: AC
  Filled 2022-02-06: qty 2

## 2022-02-06 MED ORDER — MIDAZOLAM HCL 5 MG/5ML IJ SOLN
INTRAMUSCULAR | Status: DC | PRN
  Administered 2022-02-06: 2 mg via INTRAVENOUS

## 2022-02-06 MED ORDER — FENTANYL CITRATE (PF) 250 MCG/5ML IJ SOLN
INTRAMUSCULAR | Status: AC
  Filled 2022-02-06: qty 5

## 2022-02-06 MED ORDER — SUGAMMADEX SODIUM 200 MG/2ML IV SOLN
INTRAVENOUS | Status: DC | PRN
  Administered 2022-02-06: 200 mg via INTRAVENOUS

## 2022-02-06 MED ORDER — PROPOFOL 10 MG/ML IV BOLUS
INTRAVENOUS | Status: DC | PRN
  Administered 2022-02-06: 20 mg via INTRAVENOUS
  Administered 2022-02-06: 180 mg via INTRAVENOUS

## 2022-02-06 MED ORDER — PHENYLEPHRINE 80 MCG/ML (10ML) SYRINGE FOR IV PUSH (FOR BLOOD PRESSURE SUPPORT)
PREFILLED_SYRINGE | INTRAVENOUS | Status: DC | PRN
  Administered 2022-02-06: 240 ug via INTRAVENOUS
  Administered 2022-02-06: 80 ug via INTRAVENOUS

## 2022-02-06 MED ORDER — ONDANSETRON HCL 4 MG/2ML IJ SOLN
INTRAMUSCULAR | Status: DC | PRN
  Administered 2022-02-06: 4 mg via INTRAVENOUS

## 2022-02-06 MED ORDER — ROCURONIUM BROMIDE 10 MG/ML (PF) SYRINGE
PREFILLED_SYRINGE | INTRAVENOUS | Status: DC | PRN
  Administered 2022-02-06: 60 mg via INTRAVENOUS

## 2022-02-06 MED ORDER — ACETAMINOPHEN 500 MG PO TABS: 1000.0000 mg | ORAL_TABLET | Freq: Three times a day (TID) | ORAL | 0 refills | Status: DC | PRN

## 2022-02-06 MED ORDER — OXYCODONE HCL 5 MG PO TABS: 5.0000 mg | ORAL_TABLET | Freq: Once | ORAL | Status: DC | PRN

## 2022-02-06 MED ORDER — CHLORHEXIDINE GLUCONATE CLOTH 2 % EX PADS
6.0000 | MEDICATED_PAD | Freq: Every day | CUTANEOUS | Status: DC
  Administered 2022-02-06: 6 via TOPICAL

## 2022-02-06 MED ORDER — FENTANYL CITRATE (PF) 100 MCG/2ML IJ SOLN: 25.0000 ug | INTRAMUSCULAR | Status: DC | PRN

## 2022-02-06 MED ORDER — FENTANYL CITRATE (PF) 250 MCG/5ML IJ SOLN
INTRAMUSCULAR | Status: DC | PRN
  Administered 2022-02-06 (×2): 100 ug via INTRAVENOUS
  Administered 2022-02-06: 50 ug via INTRAVENOUS

## 2022-02-06 MED ORDER — PANTOPRAZOLE SODIUM 40 MG PO TBEC
40.0000 mg | DELAYED_RELEASE_TABLET | Freq: Every day | ORAL | Status: DC
  Administered 2022-02-06: 40 mg via ORAL
  Filled 2022-02-06 (×2): qty 1

## 2022-02-06 MED ORDER — LIDOCAINE 2% (20 MG/ML) 5 ML SYRINGE
INTRAMUSCULAR | Status: AC
  Filled 2022-02-06: qty 5

## 2022-02-06 MED ORDER — BUPIVACAINE-EPINEPHRINE 0.25% -1:200000 IJ SOLN
INTRAMUSCULAR | Status: DC | PRN
  Administered 2022-02-06: 30 mL

## 2022-02-06 SURGICAL SUPPLY — 48 items
ADH SKN CLS APL DERMABOND .7 (GAUZE/BANDAGES/DRESSINGS) ×1
APL PRP STRL LF DISP 70% ISPRP (MISCELLANEOUS) ×1
APPLIER CLIP 5 13 M/L LIGAMAX5 (MISCELLANEOUS)
APR CLP MED LRG 5 ANG JAW (MISCELLANEOUS)
BAG COUNTER SPONGE SURGICOUNT (BAG) ×3 IMPLANT
BAG SPEC RTRVL 10 TROC 200 (ENDOMECHANICALS) ×1
BAG SPNG CNTER NS LX DISP (BAG) ×1
CANISTER SUCT 3000ML PPV (MISCELLANEOUS) ×3 IMPLANT
CHLORAPREP W/TINT 26 (MISCELLANEOUS) ×3 IMPLANT
CLIP APPLIE 5 13 M/L LIGAMAX5 (MISCELLANEOUS) IMPLANT
CLIP LIGATING HEMO LOK XL GOLD (MISCELLANEOUS) ×3 IMPLANT
CONT SPEC 4OZ CLIKSEAL STRL BL (MISCELLANEOUS) ×1 IMPLANT
COVER SURGICAL LIGHT HANDLE (MISCELLANEOUS) ×3 IMPLANT
CUTTER FLEX LINEAR 45M (STAPLE) ×1 IMPLANT
DERMABOND ADVANCED (GAUZE/BANDAGES/DRESSINGS) ×1
DERMABOND ADVANCED .7 DNX12 (GAUZE/BANDAGES/DRESSINGS) ×2 IMPLANT
ELECT REM PT RETURN 9FT ADLT (ELECTROSURGICAL) ×2
ELECTRODE REM PT RTRN 9FT ADLT (ELECTROSURGICAL) ×2 IMPLANT
ENDOLOOP SUT PDS II  0 18 (SUTURE)
ENDOLOOP SUT PDS II 0 18 (SUTURE) IMPLANT
GLOVE BIOGEL PI IND STRL 7.0 (GLOVE) ×2 IMPLANT
GLOVE BIOGEL PI INDICATOR 7.0 (GLOVE) ×1
GLOVE SURG SS PI 7.0 STRL IVOR (GLOVE) ×3 IMPLANT
GOWN STRL REUS W/ TWL LRG LVL3 (GOWN DISPOSABLE) ×6 IMPLANT
GOWN STRL REUS W/TWL LRG LVL3 (GOWN DISPOSABLE) ×6
GRASPER SUT TROCAR 14GX15 (MISCELLANEOUS) ×3 IMPLANT
KIT BASIN OR (CUSTOM PROCEDURE TRAY) ×3 IMPLANT
KIT TURNOVER KIT B (KITS) ×3 IMPLANT
NEEDLE 22X1 1/2 (OR ONLY) (NEEDLE) ×3 IMPLANT
NS IRRIG 1000ML POUR BTL (IV SOLUTION) ×3 IMPLANT
PAD ARMBOARD 7.5X6 YLW CONV (MISCELLANEOUS) ×6 IMPLANT
POUCH RETRIEVAL ECOSAC 10 (ENDOMECHANICALS) ×2 IMPLANT
POUCH RETRIEVAL ECOSAC 10MM (ENDOMECHANICALS) ×2
RELOAD STAPLE 45 3.5 BLU ETS (ENDOMECHANICALS) IMPLANT
RELOAD STAPLE TA45 3.5 REG BLU (ENDOMECHANICALS) ×2 IMPLANT
SCISSORS LAP 5X35 DISP (ENDOMECHANICALS) ×3 IMPLANT
SET IRRIG TUBING LAPAROSCOPIC (IRRIGATION / IRRIGATOR) ×3 IMPLANT
SET TUBE SMOKE EVAC HIGH FLOW (TUBING) ×3 IMPLANT
SLEEVE ENDOPATH XCEL 5M (ENDOMECHANICALS) ×3 IMPLANT
SPECIMEN JAR SMALL (MISCELLANEOUS) ×2 IMPLANT
SUT MNCRL AB 4-0 PS2 18 (SUTURE) ×3 IMPLANT
TOWEL GREEN STERILE (TOWEL DISPOSABLE) ×3 IMPLANT
TOWEL GREEN STERILE FF (TOWEL DISPOSABLE) ×3 IMPLANT
TRAY FOLEY W/BAG SLVR 14FR (SET/KITS/TRAYS/PACK) IMPLANT
TRAY LAPAROSCOPIC MC (CUSTOM PROCEDURE TRAY) ×3 IMPLANT
TROCAR XCEL 12X100 BLDLESS (ENDOMECHANICALS) ×3 IMPLANT
TROCAR Z-THREAD OPTICAL 5X100M (TROCAR) ×3 IMPLANT
WATER STERILE IRR 1000ML POUR (IV SOLUTION) ×2 IMPLANT

## 2022-02-06 NOTE — Op Note (Signed)
Preoperative diagnosis: acute appendicitis  Postoperative diagnosis: Same   Procedure: laparoscopic appendectomy  Surgeon: Gurney Maxin, M.D.  Anesthesia: Gen.   Indications for procedure: Jeffrey Gallagher is a 66 y.o. male with symptoms of pain in right lower quadrant and nausea consistent with acute appendicitis. Confirmed by CT.  Description of procedure: The patient was brought into the operative suite, placed supine. Anesthesia was administered with endotracheal tube. The patient's left arm was tucked. All pressure points were offloaded by foam padding. The patient was prepped and draped in the usual sterile fashion.  A transverse incision was made to the left subcostal area and a 65m trocar was [place] Pneumoperitoneum was applied with high flow low pressure.  1 522mtrocars was placed in the right lower quadrant and 1 12 mm trocar was placed in the periumbilical space. A transversus abdominal block was placed on the left and right sides. Next, the patient was placed in trendelenberg, rotated to the left. The omentum was retracted cephalad. The cecum and appendix were identified. It was not perforated, it was dilated and red. The base of the appendix was dissected and a window through the mesoappendix was created with blunt dissection. A 4539mlue load stapler was used to cut the appendix at its base. Next a gold clip was placed across the mesoappendix and the mesoappendix was cut with cautery.  The appendix was placed in a specimen bag. The pelvis and RLQ were irrigated. No purulence was seen in the pelvis. The appendix was removed via the 12 mm trocar. 0 vicryl was used to close the fascial defect. Pneumoperitoneum was removed, all trocars were removed. All incisions were closed with 4-0 monocryl subcuticular stitch. The patient woke from anesthesia and was brought to PACU in stable condition.  Findings: acute appendicitis without rupture  Specimen: appendix  Blood loss: 20 ml  Local  anesthesia: 30 ml Marcaine  Complications: none  LukGurney Maxin.D. General, Bariatric, & Minimally Invasive Surgery [facility] [place]

## 2022-02-06 NOTE — Progress Notes (Signed)
Pre Procedure note for inpatients:   Jeffrey Gallagher has been scheduled for Procedure(s): APPENDECTOMY LAPAROSCOPIC (N/A) today. The various methods of treatment have been discussed with the patient. After consideration of the risks, benefits and treatment options the patient has consented to the planned procedure.   The patient has been seen and labs reviewed. There are no changes in the patient's condition to prevent proceeding with the planned procedure today.  Recent labs:  Lab Results  Component Value Date   WBC 5.8 02/06/2022   HGB 13.2 02/06/2022   HCT 39.9 02/06/2022   PLT 355 02/06/2022   GLUCOSE 100 (H) 02/06/2022   CHOL 191 12/03/2021   TRIG 359 (H) 12/03/2021   HDL 39 (L) 12/03/2021   LDLCALC 104 (H) 12/03/2021   ALT 18 02/05/2022   AST 19 02/05/2022   NA 139 02/06/2022   K 4.3 02/06/2022   CL 105 02/06/2022   CREATININE 1.17 02/06/2022   BUN 13 02/06/2022   CO2 27 02/06/2022   TSH 2.21 08/25/2021   PSA 1.84 12/03/2021   HGBA1C 5.3 08/25/2021    [name], MD 02/06/2022 7:12 AM

## 2022-02-06 NOTE — Plan of Care (Signed)
  Problem: Nutrition: Goal: Adequate nutrition will be maintained Outcome: Progressing   Problem: Pain Managment: Goal: General experience of comfort will improve Outcome: Progressing   Problem: Safety: Goal: Ability to remain free from injury will improve Outcome: Progressing   

## 2022-02-06 NOTE — Anesthesia Preprocedure Evaluation (Signed)
Anesthesia Evaluation  Patient identified by MRN, date of birth, ID band Patient awake    Reviewed: Allergy & Precautions, H&P , NPO status , Patient's Chart, lab work & pertinent test results  Airway Mallampati: II   Neck ROM: full    Dental   Pulmonary former smoker,    breath sounds clear to auscultation       Cardiovascular negative cardio ROS   Rhythm:regular Rate:Normal     Neuro/Psych    GI/Hepatic PUD,   Endo/Other    Renal/GU      Musculoskeletal   Abdominal   Peds  Hematology   Anesthesia Other Findings   Reproductive/Obstetrics                             Anesthesia Physical Anesthesia Plan  ASA: 2  Anesthesia Plan: General   Post-op Pain Management:    Induction: Intravenous  PONV Risk Score and Plan: 2 and Ondansetron, Dexamethasone, Midazolam and Treatment may vary due to age or medical condition  Airway Management Planned: Oral ETT  Additional Equipment:   Intra-op Plan:   Post-operative Plan: Extubation in OR  Informed Consent: I have reviewed the patients History and Physical, chart, labs and discussed the procedure including the risks, benefits and alternatives for the proposed anesthesia with the patient or authorized representative who has indicated his/her understanding and acceptance.     Dental advisory given  Plan Discussed with: CRNA, Anesthesiologist and Surgeon  Anesthesia Plan Comments:         Anesthesia Quick Evaluation

## 2022-02-06 NOTE — Discharge Instructions (Signed)
[facility]  Please arrive at least 30 min before your appointment to complete your check in paperwork.  If you are unable to arrive 30 min prior to your appointment time we may have to cancel or reschedule you. LAPAROSCOPIC SURGERY: POST OP INSTRUCTIONS Always review your discharge instruction sheet given to you by the facility where your surgery was performed. IF YOU HAVE DISABILITY OR FAMILY LEAVE FORMS, YOU MUST BRING THEM TO THE OFFICE FOR PROCESSING.   DO NOT GIVE THEM TO YOUR DOCTOR.  PAIN CONTROL  First take acetaminophen (Tylenol) AND/or ibuprofen (Advil) to control your pain after surgery.  Follow directions on package.  Taking acetaminophen (Tylenol) and/or ibuprofen (Advil) regularly after surgery will help to control your pain and lower the amount of prescription pain medication you may need.  You should not take more than 4,000 mg (4 grams) of acetaminophen (Tylenol) in 24 hours.  You should not take ibuprofen (Advil), aleve, motrin, naprosyn or other NSAIDS if you have a history of stomach ulcers or chronic kidney disease.  A prescription for pain medication may be given to you upon discharge.  Take your pain medication as prescribed, if you still have uncontrolled pain after taking acetaminophen (Tylenol) or ibuprofen (Advil). Use ice packs to help control pain. If you need a refill on your pain medication, please contact your pharmacy.  They will contact our office to request authorization. Prescriptions will not be filled after 5pm or on week-ends.  HOME MEDICATIONS Take your usually prescribed medications unless otherwise directed.  DIET You should follow a light diet the first few days after arrival home.  Be sure to include lots of fluids daily. Avoid fatty, fried foods.   CONSTIPATION It is common to experience some constipation after surgery and if you are taking pain medication.  Increasing fluid intake and taking a stool softener (such as Colace)  will usually help or prevent this problem from occurring.  A mild laxative (Milk of Magnesia or Miralax) should be taken according to package instructions if there are no bowel movements after 48 hours.  WOUND/INCISION CARE Most patients will experience some swelling and bruising in the area of the incisions.  Ice packs will help.  Swelling and bruising can take several days to resolve.  Unless discharge instructions indicate otherwise, follow guidelines below  STERI-STRIPS - you may remove your outer bandages 48 hours after surgery, and you may shower at that time.  You have steri-strips (small skin tapes) in place directly over the incision.  These strips should be left on the skin for 7-10 days.   DERMABOND/SKIN GLUE - you may shower in 24 hours.  The glue will flake off over the next 2-3 weeks. Any sutures or staples will be removed at the office during your follow-up visit.  ACTIVITIES You may resume regular (light) daily activities beginning the next day--such as daily self-care, walking, climbing stairs--gradually increasing activities as tolerated.  You may have sexual intercourse when it is comfortable.  Refrain from any heavy lifting or straining until approved by your doctor. You may drive when you are no longer taking prescription pain medication, you can comfortably wear a seatbelt, and you can safely maneuver your car and apply brakes.  FOLLOW-UP You should see your doctor in the office for a follow-up appointment approximately 2-3 weeks after your surgery.  You should have been given your post-op/follow-up appointment when your surgery was scheduled.  If you did not receive a post-op/follow-up appointment, make sure   that you call for this appointment within a day or two after you arrive home to insure a convenient appointment time.  OTHER INSTRUCTIONS  WHEN TO CALL YOUR DOCTOR: Fever over 101.0 Inability to urinate Continued bleeding from incision. Increased pain, redness, or  drainage from the incision. Increasing abdominal pain  The clinic staff is available to answer your questions during regular business hours.  Please don't hesitate to call and ask to speak to one of the nurses for clinical concerns.  If you have a medical emergency, go to the nearest emergency room or call 911.  A surgeon from [facility] is always on call at the hospital. [address], [address], [place], [place]  [address] ? [address] [address], [place], [place]   [address] [phone] ? [phone] ? [phone]   

## 2022-02-06 NOTE — Transfer of Care (Signed)
Immediate Anesthesia Transfer of Care Note  Patient: Jeffrey Gallagher  Procedure(s) Performed: APPENDECTOMY LAPAROSCOPIC (Abdomen)  Patient Location: PACU  Anesthesia Type:General  Level of Consciousness: awake, alert  and oriented  Airway & Oxygen Therapy: Patient Spontanous Breathing and Patient connected to nasal cannula oxygen  Post-op Assessment: Report given to RN and Post -op Vital signs reviewed and stable  Post vital signs: Reviewed and stable  Last Vitals:  Vitals Value Taken Time  BP 106/47   Temp    Pulse 66 02/06/22 0840  Resp 20 02/06/22 0840  SpO2 96 % 02/06/22 0840  Vitals shown include unvalidated device data.  Last Pain:  Vitals:   02/06/22 0527  TempSrc: Oral  PainSc:          Complications: No notable events documented.

## 2022-02-06 NOTE — Progress Notes (Signed)
Mobility Specialist - Progress Note   02/06/22 1500  Mobility  Activity Ambulated independently in hallway  Level of Assistance Independent  Assistive Device None  Distance Ambulated (ft) 550 ft  Activity Response Tolerated well  $Mobility charge 1 Mobility   Pt received in bed and agreeable to mobility. Pt left in room with all needs met.   Paulla Dolly Mobility Specialist

## 2022-02-06 NOTE — Plan of Care (Signed)
Received pt from ED last night, for surgery today morning, NPO at MN. CHG bath done.  Problem: Coping: Goal: Level of anxiety will decrease Outcome: Progressing   Problem: Pain Managment: Goal: General experience of comfort will improve Outcome: Progressing

## 2022-02-06 NOTE — Anesthesia Procedure Notes (Signed)
Procedure Name: Intubation Date/Time: 02/06/2022 7:30 AM  Performed by: Wilburn Cornelia, CRNAPre-anesthesia Checklist: Patient identified, Emergency Drugs available, Suction available, Patient being monitored and Timeout performed Patient Re-evaluated:Patient Re-evaluated prior to induction Oxygen Delivery Method: Circle system utilized Preoxygenation: Pre-oxygenation with 100% oxygen Induction Type: IV induction Ventilation: Mask ventilation without difficulty Laryngoscope Size: Mac and 4 Grade View: Grade IV Tube type: Oral Tube size: 7.5 mm Number of attempts: 1 Airway Equipment and Method: Stylet Placement Confirmation: ETT inserted through vocal cords under direct vision, breath sounds checked- equal and bilateral, CO2 detector and positive ETCO2 Secured at: 24 cm Tube secured with: Tape Dental Injury: Teeth and Oropharynx as per pre-operative assessment

## 2022-02-06 NOTE — Discharge Summary (Signed)
    Patient ID: Jeffrey Gallagher [id number] May 22, 1956 66 y.o.  Admit date: 02/05/2022 Discharge date: 02/06/2022   Discharge Diagnosis Acute Appendicitis s/p Laparoscopic Appendectomy UC managed by Dr. Tarri Glenn on [place]   Consultants None  Reason for Admission: Jeffrey Gallagher is a 66 y.o. male with a hx of UC managed by Dr. Tarri Glenn on [place] who presented to the [facility] ED for RLQ abdominal pain.  Pain began on 7/24 with associated bloating.  States that the pain was initially across his abdomen, but now just more in the RLQ. No fever, chills, nausea or vomiting. He saw his pcp and had an outpatient scan read yesterday that showed dilated appendix measuring 12 mm with question of mild fat stranding adjacent to this.  He presented to the ED today for evaluation. Afebrile here without tachycardia or hypotension. WBC wnl. Repeat CT w/ mild dilation and mucosal hyperemia in the appendix with mild surrounding inflammatory fat stranding suggestive of mild/early acute appendicitis without evidence of perforation or abscess.  We were asked to see.  Procedures Dr. Kieth Brightly - Laparoscopic Appendectomy - 02/06/22  Hospital Course:  The patient was admitted and underwent a laparoscopic appendectomy.  The patient tolerated the procedure well.  On POD 0, the patient was tolerating a regular diet, voiding well, mobilizing, and pain was controlled with oral pain medications.  The patient was stable for DC home at this time with appropriate follow up made. Discussed discharge instructions, restrictions and return/call back precautions.   Physical Exam: Gen:  Alert, NAD, pleasant Card:  Reg Pulm:  CTAB, no W/R/R, effort normal Abd: Soft, ND, appropriately tender around incisions, +BS, Incisions with glue intact appears well and are without drainage, bleeding, or signs of infection Psych: A&Ox3   Allergies as of 02/06/2022   No Known Allergies      Medication List     TAKE these medications     acetaminophen 500 MG tablet Commonly known as: TYLENOL Take 2 tablets (1,000 mg total) by mouth every 8 (eight) hours as needed for mild pain.   aspirin 81 MG tablet Take 81 mg by mouth daily.   mesalamine 1.2 g EC tablet Commonly known as: LIALDA Take 4 tablets (4.8 g total) by mouth daily.   MULTIVITAMIN PO Take by mouth. Take 1 tab daily   OVER THE COUNTER MEDICATION Flaxseed oil 1 capsule daily.   oxyCODONE 5 MG immediate release tablet Commonly known as: Oxy IR/ROXICODONE Take 1 tablet (5 mg total) by mouth every 6 (six) hours as needed for breakthrough pain.   PROBIOTIC PO Take 1 capsule by mouth daily.   VITAMIN D PO Take 6,000 Int'l Units by mouth daily.          Follow-up [facility], [place]. Call.   Specialty: General Surgery Why: We are working on your appointment,call to confirm, Arrive 64mn early to check in, fill out paperwork, BEngineer, civil (consulting)ID and iDoctor, general practiceinformation: [id number] [phone]               Signed: [facility] 02/06/2022, 3:10 PM Please see Amion for pager number during day hours 7:00am-4:30pm

## 2022-02-09 ENCOUNTER — Encounter (HOSPITAL_COMMUNITY): Payer: Self-pay | Admitting: General Surgery

## 2022-02-09 LAB — SURGICAL PATHOLOGY

## 2022-02-16 NOTE — Anesthesia Postprocedure Evaluation (Signed)
Anesthesia Post Note  Patient: Elwyn Lade Schoneman  Procedure(s) Performed: APPENDECTOMY LAPAROSCOPIC (Abdomen)     Patient location during evaluation: PACU Anesthesia Type: General Level of consciousness: awake and alert Pain management: pain level controlled Vital Signs Assessment: post-procedure vital signs reviewed and stable Respiratory status: spontaneous breathing, nonlabored ventilation, respiratory function stable and patient connected to nasal cannula oxygen Cardiovascular status: blood pressure returned to baseline and stable Postop Assessment: no apparent nausea or vomiting Anesthetic complications: no   No notable events documented.  Last Vitals:  Vitals:   02/06/22 0943 02/06/22 1638  BP: 130/75 122/70  Pulse: (!) 53 72  Resp:  17  Temp: 36.4 C   SpO2: 98% 97%    Last Pain:  Vitals:   02/06/22 0945  TempSrc:   PainSc: 0-No pain                 Shelva Hetzer S

## 2022-03-06 ENCOUNTER — Telehealth: Payer: Self-pay | Admitting: Gastroenterology

## 2022-03-06 DIAGNOSIS — K5150 Left sided colitis without complications: Secondary | ICD-10-CM

## 2022-03-06 MED ORDER — MESALAMINE 1.2 G PO TBEC: 4.8000 g | DELAYED_RELEASE_TABLET | Freq: Every day | ORAL | 3 refills | Status: DC

## 2022-03-06 NOTE — Telephone Encounter (Signed)
Patient called, states his mesalamine 1.2 g medication is on back order at his regular pharmacy. Patient states he transfer the medication to [facility] [address], phone # [phone] to fill. Pharmacy is requesting an approval for the go ahead. Please call to advise.

## 2022-03-06 NOTE — Telephone Encounter (Signed)
Script sent to new pharmacy

## 2022-03-11 NOTE — Progress Notes (Signed)
R  E  S  C  H  E  D  U  L  E  D                                  Date Time Provider Department  03/12/2022  9:30 AM Unk Pinto, MD [facility]  04/21/2022  2:00 PM Unk Pinto, MD [facility]    History of Present Illness:      Patient is a very nice 66 yo  MWM with HTN, HLD, Ulcerative Colitis, Pre-Diabetes and Vitamin D Deficiency who is 1 month s/p emergent lap Appendectomy w/o complications who presents now with c/o acid reflux & increased "burping"          Medications     Current Outpatient Medications (Analgesics):    acetaminophen (TYLENOL) 500 MG tablet, Take 2 tablets (1,000 mg total) by mouth every 8 (eight) hours as needed for mild pain.   aspirin 81 MG tablet, Take 81 mg by mouth daily.   oxyCODONE (OXY IR/ROXICODONE) 5 MG immediate release tablet, Take 1 tablet (5 mg total) by mouth every 6 (six) hours as needed for breakthrough pain.   Current Outpatient Medications (Other):    Cholecalciferol (VITAMIN D PO), Take 6,000 Int'l Units by mouth daily.   mesalamine (LIALDA) 1.2 g EC tablet, Take 4 tablets (4.8 g total) by mouth daily.   Multiple Vitamins-Minerals (MULTIVITAMIN PO), Take by mouth. Take 1 tab daily   OVER THE COUNTER MEDICATION, Flaxseed oil 1 capsule daily.   Probiotic Product (PROBIOTIC PO), Take 1 capsule by mouth daily.  Problem list He has Vitamin D deficiency; Mixed hyperlipidemia; ULCERATIVE COLITIS, LEFT SIDED; Hemorrhoids; Elevated PSA; Elevated BP; Medication management; and Acute appendicitis on their problem list.   Observations/Objective:  There were no vitals  taken for this visit.  HEENT - WNL. Neck - supple.  Chest - Clear equal BS. Cor - Nl HS. RRR w/o sig MGR. PP 1(+). No edema. MS- FROM w/o deformities.  Gait Nl. Neuro -  Nl w/o focal abnormalities.   Assessment and Plan:      Follow Up Instructions:        I discussed the assessment and treatment plan with the patient. The patient was provided an opportunity to ask questions and all were answered. The patient agreed with the plan and demonstrated an understanding of the instructions.       The patient was  advised to call back or seek an in-person evaluation if the symptoms worsen or if the condition fails to improve as anticipated.    Kirtland Bouchard, MD

## 2022-03-12 ENCOUNTER — Ambulatory Visit: Payer: Medicare Other | Admitting: Internal Medicine

## 2022-03-12 ENCOUNTER — Ambulatory Visit (INDEPENDENT_AMBULATORY_CARE_PROVIDER_SITE_OTHER): Payer: Medicare Other | Admitting: Nurse Practitioner

## 2022-03-12 ENCOUNTER — Encounter: Payer: Self-pay | Admitting: Nurse Practitioner

## 2022-03-12 VITALS — BP 118/70 | HR 74 | Temp 97.5°F | Ht 69.0 in | Wt 168.8 lb

## 2022-03-12 DIAGNOSIS — Z79899 Other long term (current) drug therapy: Secondary | ICD-10-CM

## 2022-03-12 DIAGNOSIS — K5150 Left sided colitis without complications: Secondary | ICD-10-CM | POA: Diagnosis not present

## 2022-03-12 DIAGNOSIS — D509 Iron deficiency anemia, unspecified: Secondary | ICD-10-CM | POA: Diagnosis not present

## 2022-03-12 DIAGNOSIS — K2100 Gastro-esophageal reflux disease with esophagitis, without bleeding: Secondary | ICD-10-CM

## 2022-03-12 MED ORDER — PANTOPRAZOLE SODIUM 40 MG PO TBEC: 40.0000 mg | DELAYED_RELEASE_TABLET | Freq: Every day | ORAL | 1 refills | Status: DC

## 2022-03-12 NOTE — Progress Notes (Signed)
Assessment and Plan:  Jeffrey Gallagher was seen today for acute visit.  Diagnoses and all orders for this visit:  Iron deficiency anemia, unspecified iron deficiency anemia type Continue iron supplementation Monitor symptoms -     CBC with Differential/Platelet -     COMPLETE METABOLIC PANEL WITH GFR -     Iron, Total/Total Iron Binding Cap -     Ferritin  ULCERATIVE COLITIS, LEFT SIDED Continue Lialda and follow with Dr. Tarri Glenn  Gastroesophageal reflux disease with esophagitis, unspecified whether hemorrhage Practice behavior/diet modifications, protonix as needed -     pantoprazole (PROTONIX) 40 MG tablet; Take 1 tablet (40 mg total) by mouth daily.  Medication management -     CBC with Differential/Platelet -     COMPLETE METABOLIC PANEL WITH GFR -     Iron, Total/Total Iron Binding Cap -     Ferritin -     pantoprazole (PROTONIX) 40 MG tablet; Take 1 tablet (40 mg total) by mouth daily.       Further disposition pending results of labs. Discussed med's effects and SE's.   Over 30 minutes of exam, counseling, chart review, and critical decision making was performed.   Future Appointments  Date Time Provider [place]  03/12/2022  1:45 PM [name], NP [facility] None  04/21/2022  2:00 PM Unk Pinto, MD [facility] None    ------------------------------------------------------------------------------------------------------------------   HPI BP 118/70   Pulse 74   Temp (!) 97.5 F (36.4 C)   Ht '5\' 9"'$  (1.753 m)   Wt 168 lb 12.8 oz (76.6 kg)   SpO2 97%   BMI 24.93 kg/m    66 y.o.male presents for worsening acid reflux and burping  He does have a history of ulcerative colitis and is followed by Dr. Tarri Glenn- last visit was 1 year ago.  Continues on Lialda daily. He had a borderline iron deficiency 1 year ago.  Would like to have this retested. He is possibly having a flare currently and is feeling fatigued.  He did start taking iron pills over the  counter yesterday.   Lab Results  Component Value Date   WBC 5.8 02/06/2022   HGB 13.2 02/06/2022   HCT 39.9 02/06/2022   MCV 95.9 02/06/2022   PLT 355 02/06/2022     He did undergo appendectomy 01/2022. Doing well. No pain.     Past Medical History:  Diagnosis Date   Anemia    Hyperlipidemia    Internal hemorrhoids    Ulcerative colitis, left sided ([place])    Vitamin D deficiency      No Known Allergies  Current Outpatient Medications on File Prior to Visit  Medication Sig   acetaminophen (TYLENOL) 500 MG tablet Take 2 tablets (1,000 mg total) by mouth every 8 (eight) hours as needed for mild pain.   aspirin 81 MG tablet Take 81 mg by mouth daily.   Cholecalciferol (VITAMIN D PO) Take 6,000 Int'l Units by mouth daily.   mesalamine (LIALDA) 1.2 g EC tablet Take 4 tablets (4.8 g total) by mouth daily.   Multiple Vitamins-Minerals (MULTIVITAMIN PO) Take by mouth. Take 1 tab daily   OVER THE COUNTER MEDICATION Flaxseed oil 1 capsule daily.   Probiotic Product (PROBIOTIC PO) Take 1 capsule by mouth daily.   oxyCODONE (OXY IR/ROXICODONE) 5 MG immediate release tablet Take 1 tablet (5 mg total) by mouth every 6 (six) hours as needed for breakthrough pain. (Patient not taking: Reported on 03/12/2022)   No current facility-administered medications on  file prior to visit.    ROS: all negative except above.   Physical Exam:  BP 118/70   Pulse 74   Temp (!) 97.5 F (36.4 C)   Ht '5\' 9"'$  (1.753 m)   Wt 168 lb 12.8 oz (76.6 kg)   SpO2 97%   BMI 24.93 kg/m   General Appearance: Well nourished, in no apparent distress. Eyes: PERRLA, EOMs, conjunctiva no swelling or erythema Sinuses: No Frontal/maxillary tenderness ENT/Mouth: Ext aud canals clear, TMs without erythema, bulging. No erythema, swelling, or exudate on post pharynx.  Tonsils not swollen or erythematous. Hearing normal.  Neck: Supple, thyroid normal.  Respiratory: Respiratory effort normal, BS equal bilaterally  without rales, rhonchi, wheezing or stridor.  Cardio: RRR with no MRGs. Brisk peripheral pulses without edema.  Abdomen: Soft, + BS.  Non tender, no guarding, rebound, hernias, masses. Lymphatics: Non tender without lymphadenopathy.  Musculoskeletal: Full ROM, 5/5 strength, normal gait.  Skin: Warm, dry without rashes, lesions, ecchymosis.  Neuro: Cranial nerves intact. Normal muscle tone, no cerebellar symptoms. Sensation intact.  Psych: Awake and oriented X 3, normal affect, Insight and Judgment appropriate.     [name], NP 9:30 AM [facility]

## 2022-03-13 LAB — CBC WITH DIFFERENTIAL/PLATELET
Absolute Monocytes: 475 {cells}/uL (ref 200–950)
Basophils Absolute: 50 {cells}/uL (ref 0–200)
Basophils Relative: 1 %
Eosinophils Absolute: 120 {cells}/uL (ref 15–500)
Eosinophils Relative: 2.4 %
HCT: 39.7 % (ref 38.5–50.0)
Hemoglobin: 13.9 g/dL (ref 13.2–17.1)
Lymphs Abs: 1510 {cells}/uL (ref 850–3900)
MCH: 32.2 pg (ref 27.0–33.0)
MCHC: 35 g/dL (ref 32.0–36.0)
MCV: 91.9 fL (ref 80.0–100.0)
MPV: 10.1 fL (ref 7.5–12.5)
Monocytes Relative: 9.5 %
Neutro Abs: 2845 {cells}/uL (ref 1500–7800)
Neutrophils Relative %: 56.9 %
Platelets: 278 Thousand/uL (ref 140–400)
RBC: 4.32 Million/uL (ref 4.20–5.80)
RDW: 12.2 % (ref 11.0–15.0)
Total Lymphocyte: 30.2 %
WBC: 5 Thousand/uL (ref 3.8–10.8)

## 2022-03-13 LAB — COMPLETE METABOLIC PANEL WITH GFR
AG Ratio: 2 (calc) (ref 1.0–2.5)
ALT: 15 U/L (ref 9–46)
AST: 12 U/L (ref 10–35)
Albumin: 4.4 g/dL (ref 3.6–5.1)
Alkaline phosphatase (APISO): 74 U/L (ref 35–144)
BUN: 14 mg/dL (ref 7–25)
CO2: 26 mmol/L (ref 20–32)
Calcium: 9.3 mg/dL (ref 8.6–10.3)
Chloride: 106 mmol/L (ref 98–110)
Creat: 1.1 mg/dL (ref 0.70–1.35)
Globulin: 2.2 g/dL (calc) (ref 1.9–3.7)
Glucose, Bld: 91 mg/dL (ref 65–99)
Potassium: 4.6 mmol/L (ref 3.5–5.3)
Sodium: 139 mmol/L (ref 135–146)
Total Bilirubin: 0.5 mg/dL (ref 0.2–1.2)
Total Protein: 6.6 g/dL (ref 6.1–8.1)
eGFR: 74 mL/min/{1.73_m2} (ref >=60)

## 2022-03-13 LAB — IRON, TOTAL/TOTAL IRON BINDING CAP
%SAT: 44 % (ref 20–48)
Iron: 139 ug/dL (ref 50–180)
TIBC: 314 ug/dL (ref 250–425)

## 2022-03-13 LAB — FERRITIN: Ferritin: 30 ng/mL (ref 24–380)

## 2022-03-15 ENCOUNTER — Other Ambulatory Visit: Payer: Self-pay

## 2022-03-15 ENCOUNTER — Encounter (HOSPITAL_COMMUNITY): Payer: Self-pay | Admitting: Emergency Medicine

## 2022-03-15 ENCOUNTER — Emergency Department (HOSPITAL_COMMUNITY)
Admission: EM | Admit: 2022-03-15 | Discharge: 2022-03-16 | Disposition: A | Discharge status: Home / Self Care | Payer: Medicare Other | Attending: Emergency Medicine | Admitting: Emergency Medicine

## 2022-03-15 DIAGNOSIS — Z20822 Contact with and (suspected) exposure to covid-19: Secondary | ICD-10-CM | POA: Insufficient documentation

## 2022-03-15 DIAGNOSIS — R42 Dizziness and giddiness: Secondary | ICD-10-CM

## 2022-03-15 DIAGNOSIS — R911 Solitary pulmonary nodule: Secondary | ICD-10-CM | POA: Diagnosis not present

## 2022-03-15 DIAGNOSIS — R531 Weakness: Secondary | ICD-10-CM | POA: Diagnosis not present

## 2022-03-15 DIAGNOSIS — E876 Hypokalemia: Secondary | ICD-10-CM | POA: Diagnosis not present

## 2022-03-15 DIAGNOSIS — Z7982 Long term (current) use of aspirin: Secondary | ICD-10-CM | POA: Diagnosis not present

## 2022-03-15 LAB — CBC
HCT: 41.7 % (ref 39.0–52.0)
Hemoglobin: 14.5 g/dL (ref 13.0–17.0)
MCH: 32.3 pg (ref 26.0–34.0)
MCHC: 34.8 g/dL (ref 30.0–36.0)
MCV: 92.9 fL (ref 80.0–100.0)
Platelets: 303 10*3/uL (ref 150–400)
RBC: 4.49 MIL/uL (ref 4.22–5.81)
RDW: 12.6 % (ref 11.5–15.5)
WBC: 8.6 10*3/uL (ref 4.0–10.5)
nRBC: 0 % (ref 0.0–0.2)

## 2022-03-15 LAB — COMPREHENSIVE METABOLIC PANEL
ALT: 17 U/L (ref 0–44)
AST: 16 U/L (ref 15–41)
Albumin: 4 g/dL (ref 3.5–5.0)
Alkaline Phosphatase: 70 U/L (ref 38–126)
Anion gap: 9 (ref 5–15)
BUN: 10 mg/dL (ref 8–23)
CO2: 23 mmol/L (ref 22–32)
Calcium: 9.1 mg/dL (ref 8.9–10.3)
Chloride: 107 mmol/L (ref 98–111)
Creatinine, Ser: 1.08 mg/dL (ref 0.61–1.24)
GFR, Estimated: >60 mL/min (ref >60)
Glucose, Bld: 109 mg/dL — ABNORMAL HIGH (ref 70–99)
Potassium: 3.4 mmol/L — ABNORMAL LOW (ref 3.5–5.1)
Sodium: 139 mmol/L (ref 135–145)
Total Bilirubin: 0.8 mg/dL (ref 0.3–1.2)
Total Protein: 6.8 g/dL (ref 6.5–8.1)

## 2022-03-15 LAB — LIPASE, BLOOD: Lipase: 24 U/L (ref 11–51)

## 2022-03-15 NOTE — ED Triage Notes (Signed)
Pt reports he had his appendix removed 4 weeks ago, c/o bloating, decreased BMs and increased belching. Also c/o feeling lightheaded.

## 2022-03-16 ENCOUNTER — Emergency Department (HOSPITAL_COMMUNITY): Payer: Medicare Other

## 2022-03-16 DIAGNOSIS — R911 Solitary pulmonary nodule: Secondary | ICD-10-CM | POA: Diagnosis not present

## 2022-03-16 DIAGNOSIS — R531 Weakness: Secondary | ICD-10-CM | POA: Diagnosis not present

## 2022-03-16 LAB — SARS CORONAVIRUS 2 BY RT PCR: SARS Coronavirus 2 by RT PCR: NEGATIVE

## 2022-03-16 LAB — TROPONIN I (HIGH SENSITIVITY): Troponin I (High Sensitivity): 4 ng/L (ref <18)

## 2022-03-16 MED ORDER — LACTATED RINGERS IV BOLUS
1000.0000 mL | Freq: Once | INTRAVENOUS | Status: AC
  Administered 2022-03-16: 1000 mL via INTRAVENOUS

## 2022-03-16 NOTE — ED Notes (Signed)
Aware of need for urine sample, urinal at bedside 

## 2022-03-16 NOTE — ED Provider Notes (Signed)
[facility] EMERGENCY DEPARTMENT Provider Note   CSN: [id number] Arrival date & time: 03/15/22  1954     History  Chief Complaint  Patient presents with   Dizziness    Jeffrey Gallagher is a 66 y.o. male with history of ulcerative colitis and the appendectomy for acute presents with concern for 6 days of lightheadedness with change in position, belching, generalized fatigue.  No fevers or chills at home, no chest pain or shortness of breath, no palpitations, no abdominal pain.  Does state that he was golfing out in the heat several days ago and began to feel as though he would syncopized.  He did not syncopized but states he does not feel he does drink enough water since that occurred.  I personally reviewed his medical records.  In addition to the above listed history is history of vitamin D deficiency and hyperlipidemia.  HPI     Home Medications Prior to Admission medications   Medication Sig Start Date End Date Taking? Authorizing Provider  acetaminophen (TYLENOL) 500 MG tablet Take 2 tablets (1,000 mg total) by mouth every 8 (eight) hours as needed for mild pain. 02/06/22   Maczis, Barth Kirks, PA-C  aspirin 81 MG tablet Take 81 mg by mouth daily.    [provider]  Cholecalciferol (VITAMIN D PO) Take 6,000 Int'l Units by mouth daily.    [provider]  mesalamine (LIALDA) 1.2 g EC tablet Take 4 tablets (4.8 g total) by mouth daily. 03/06/22   Thornton Park, MD  Multiple Vitamins-Minerals (MULTIVITAMIN PO) Take by mouth. Take 1 tab daily    [provider]  OVER THE COUNTER MEDICATION Flaxseed oil 1 capsule daily.    [provider]  pantoprazole (PROTONIX) 40 MG tablet Take 1 tablet (40 mg total) by mouth daily. 03/12/22 03/12/23  [name], NP  Probiotic Product (PROBIOTIC PO) Take 1 capsule by mouth daily.    [provider]      Allergies    Patient has no known allergies.    Review of Systems   Review  of Systems  Constitutional:  Positive for fatigue.  HENT: Negative.    Eyes: Negative.   Respiratory: Negative.    Cardiovascular: Negative.   Gastrointestinal: Negative.        Belching  Genitourinary: Negative.   Musculoskeletal: Negative.   Neurological:  Positive for light-headedness. Negative for tremors, seizures, syncope, facial asymmetry, speech difficulty, weakness and headaches.    Physical Exam Updated Vital Signs BP 129/75   Pulse (!) 56   Temp 97.6 F (36.4 C) (Axillary)   Resp 15   SpO2 96%  Physical Exam Vitals and nursing note reviewed.  Constitutional:      Appearance: He is not ill-appearing or toxic-appearing.  HENT:     Head: Normocephalic and atraumatic.     Mouth/Throat:     Mouth: Mucous membranes are moist.     Pharynx: No oropharyngeal exudate or posterior oropharyngeal erythema.  Eyes:     General:        Right eye: No discharge.        Left eye: No discharge.     Extraocular Movements: Extraocular movements intact.     Conjunctiva/sclera: Conjunctivae normal.     Pupils: Pupils are equal, round, and reactive to light.  Cardiovascular:     Rate and Rhythm: Normal rate and regular rhythm.     Pulses: Normal pulses.     Heart sounds: Normal heart  sounds. No murmur heard. Pulmonary:     Effort: Pulmonary effort is normal. No respiratory distress.     Breath sounds: Normal breath sounds. No wheezing or rales.  Abdominal:     General: Bowel sounds are increased. There is no distension.     Palpations: Abdomen is soft.     Tenderness: There is no abdominal tenderness. There is no right CVA tenderness, left CVA tenderness, guarding or rebound.  Musculoskeletal:        General: No deformity.     Cervical back: Neck supple.     Right lower leg: No edema.     Left lower leg: No edema.  Skin:    General: Skin is warm and dry.     Capillary Refill: Capillary refill takes less than 2 seconds.  Neurological:     General: No focal deficit present.      Mental Status: He is alert and oriented to person, place, and time. Mental status is at baseline.     GCS: GCS eye subscore is 4. GCS verbal subscore is 5. GCS motor subscore is 6.     Cranial Nerves: Cranial nerves 2-12 are intact.     Sensory: Sensation is intact.     Motor: Motor function is intact.     Coordination: Coordination is intact.     Gait: Gait is intact.  Psychiatric:        Mood and Affect: Mood normal.     ED Results / Procedures / Treatments   Labs (all labs ordered are listed, but only abnormal results are displayed) Labs Reviewed  COMPREHENSIVE METABOLIC PANEL - Abnormal; Notable for the following components:      Result Value   Potassium 3.4 (*)    Glucose, Bld 109 (*)    All other components within normal limits  SARS CORONAVIRUS 2 BY RT PCR  LIPASE, BLOOD  CBC  TROPONIN I (HIGH SENSITIVITY)  TROPONIN I (HIGH SENSITIVITY)    EKG EKG Interpretation  Date/Time:  Sunday March 15 2022 20:05:24 EDT Ventricular Rate:  71 PR Interval:  134 QRS Duration: 88 QT Interval:  396 QTC Calculation: 430 R Axis:   92 Text Interpretation: Normal sinus rhythm Rightward axis Borderline ECG When compared with ECG of 26-Jul-2021 02:03, PREVIOUS ECG IS PRESENT Confirmed by Addison Lank [id number] on 03/16/2022 5:36:19 AM  Radiology DG Chest Portable 1 View  Result Date: 03/16/2022 CLINICAL DATA:  Weakness. EXAM: PORTABLE CHEST 1 VIEW COMPARISON:  06/06/2012 FINDINGS: The lungs are clear without focal pneumonia, edema, pneumothorax or pleural effusion. 8 mm pulmonary nodule identified right parahilar mid lung. The cardiopericardial silhouette is within normal limits for size. The visualized bony structures of the thorax are unremarkable. Telemetry leads overlie the chest. IMPRESSION: 8 mm right parahilar pulmonary nodule. CT chest without contrast recommended to further evaluate. Electronically Signed   By: Misty Stanley M.D.   On: 03/16/2022 06:30     Procedures Procedures   Medications Ordered in ED Medications  lactated ringers bolus 1,000 mL (1,000 mLs Intravenous New Bag/Given 03/16/22 0539)    ED Course/ Medical Decision Making/ A&P                           Medical Decision Making 66 year old male presents with concern for lightheadedness with change in position for the last few days.  Mild hypertensive intake and vital signs otherwise normal.  Cardiopulmonary sounds normal, abdominal exam is benign.  Neurovascular tact in  extremities, ambulatory without difficulty in the ED.  Nonfocal neuro exam.  Differential diagnosis includes was limited to hypovolemia, dysrhythmia, ACS, neurologic presentation    Amount and/or Complexity of Data Reviewed Labs: ordered.    Details: CBC cytosis or anemia, CMP with only mild hypokalemia 3.4.  Lipase is normal, troponin is negative.  COVID test was negative.   Radiology: ordered.    Details: Chest x-ray negative for acute cardiopulmonary disease though there is an 8 mm right perihilar pulmonary nodule.  Patient states this is pre-existing.   ECG/medicine tests:     Details:  EKG with sinus rhythm no STEMI.    Clinical picture most consistent with orthostasis, resolved at this time following fluid bolus.  Recommend increase oral hydration at home and close outpatient routine follow-up with his gastroenterologist and PCP for further evaluation of pulmonary nodule.  Jeffrey Gallagher and his partner  voiced understanding of his medical evaluation and treatment plan. Each of their questions answered to their expressed satisfaction.  Return precautions were given.  Patient is well-appearing, stable, and was discharged in good condition.  This chart was dictated using voice recognition software, Dragon. Despite the best efforts of this provider to proofread and correct errors, errors may still occur which can change documentation meaning.  Final Clinical Impression(s) / ED Diagnoses Final diagnoses:   Lightheadedness    Rx / DC Orders ED Discharge Orders     None         Aura Dials 03/16/22 0724    Fatima Blank, MD 03/17/22 [phone]

## 2022-03-16 NOTE — Discharge Instructions (Addendum)
You are seen in the ER today for your lightheadedness.  Your physical exam and blood work are very reassuring.  The exact cause of your lightheadedness remains unclear does not appear to be any emergent problem at this time.  Please follow-up with your gastroenterologist as discussed.  Additionally you should follow-up with your primary care doctor for a small lung nodule noted on the right lung.  Return to the ER with any severe symptoms.

## 2022-03-16 NOTE — ED Notes (Signed)
Discharge instructions reviewed and educatoin provided. All questions answered. Pt verbalizes understanding. Pt taken to ER exit in wheelchair in stable condition with all belongings

## 2022-04-13 ENCOUNTER — Ambulatory Visit: Payer: Medicare Other | Admitting: Dermatology

## 2022-04-20 ENCOUNTER — Encounter: Payer: Self-pay | Admitting: Internal Medicine

## 2022-04-20 NOTE — Patient Instructions (Signed)

## 2022-04-20 NOTE — Progress Notes (Unsigned)
Annual  Screening/Preventative Visit  & Comprehensive Evaluation & Examination  Future Appointments  Date Time Provider Department  04/21/2022  2:00 PM Unk Pinto, MD [facility]  05/11/2022  3:20 PM Thornton Park, MD [facility]  04/22/2023  2:00 PM Unk Pinto, MD [facility]   In May 2023 , patient had [facility] by Fabio Asa, NP           This very nice 66 y.o.male presents for a Screening /Preventative Visit & comprehensive evaluation and management of multiple medical co-morbidities.  Patient has been followed for HTN, HLD, Prediabetes and Vitamin D Deficiency. Patient is  followed by Dr Laurier Nancy  on [place] for Ulcerative Colitis.  In July , patient underwent emergent appendectomy.          Labile  HTN predates circa 2007. Patient's BP has been controlled at home.  Today's  . Patient denies any cardiac symptoms as chest pain, palpitations, shortness of breath, dizziness or ankle swelling.       Patient's hyperlipidemia ihas not been  controlled with diet in the past . Patient denies myalgias or other medication SE's. Last lipids were not at goal :  Lab Results  Component Value Date   CHOL 191 12/03/2021   HDL 39 (L) 12/03/2021   LDLCALC 104 (H) 12/03/2021   TRIG 359 (H) 12/03/2021   CHOLHDL 4.9 12/03/2021         Patient has been monitored for glucose intolerance and patient denies reactive hypoglycemic symptoms, visual blurring, diabetic polys or paresthesias. Last A1c was normal & at goal :   Lab Results  Component Value Date   HGBA1C 5.3 08/25/2021          Finally, patient has history of Vitamin D Deficiency ("41" /2008) and  last vitamin D was at goal :   Lab Results  Component Value Date   VD25OH 86 08/25/2021     Current Outpatient Medications on File Prior to Visit  Medication Sig   acetaminophen 500 MG tablet Take 2 tablets every 8 hours as needed    aspirin 81 MG tablet Take  daily.   VITAMIN D 6,000 Units  Take daily.    mesalamine (LIALDA) 1.2 g EC tablet Take 4 tablets daily.   Multiple Vitamins-Minerals Take 1 tab daily   Flaxseed oil  1 capsule daily.   pantoprazole 40 MG tablet Take 1 tablet daily.   PROBIOTIC  Take 1 capsule daily.    No Known Allergies   Past Medical History:  Diagnosis Date   Anemia    Hyperlipidemia    Internal hemorrhoids    Ulcerative colitis, left sided ([place])    Vitamin D deficiency      Health Maintenance  Topic Date Due   COVID-19 Vaccine (1) Never done   Zoster Vaccines- Shingrix (1 of 2) Never done   INFLUENZA VACCINE  Never done   Pneumonia Vaccine 65+ Years old (1 - PCV) 12/04/2022 (Originally 07/24/2021)   TETANUS/TDAP  12/04/2022 (Originally 07/14/2011)   Hepatitis C Screening  12/04/2022 (Originally 07/24/1973)   HIV Screening  Completed   HPV VACCINES  Aged Out     Immunization History  Administered Date(s) Administered   Td 07/13/2001    Last Colon - 07/22/2020 - Dr Laurier Nancy   Past Surgical History:  Procedure Laterality Date   LAPAROSCOPIC APPENDECTOMY N/A 02/06/2022   Procedure: APPENDECTOMY LAPAROSCOPIC;  Surgeon: [name], MD;  Location: [facility];  Service: General;  Laterality: N/A;  Family History  Problem Relation Age of Onset   Coronary artery disease Father    Lung cancer Mother    Colon cancer Neg Hx    Esophageal cancer Neg Hx    Stomach cancer Neg Hx    Rectal cancer Neg Hx      Social History   Tobacco Use   Smoking status: Former    Types: Cigarettes    Quit date: 04/20/2003    Years since quitting: 19.0   Smokeless tobacco: Never  Vaping Use   Vaping Use: Never used  Substance Use Topics   Alcohol use: Yes    Alcohol/week: 7.0 standard drinks of alcohol    Types: 7 Cans of beer per week    Comment: 2 drinks a day   Drug use: No      ROS Constitutional: Denies fever, chills, weight loss/gain, headaches, insomnia,  night sweats or change in appetite. Does c/o fatigue. Eyes: Denies redness,  blurred vision, diplopia, discharge, itchy or watery eyes.  ENT: Denies discharge, congestion, post nasal drip, epistaxis, sore throat, earache, hearing loss, dental pain, Tinnitus, Vertigo, Sinus pain or snoring.  Cardio: Denies chest pain, palpitations, irregular heartbeat, syncope, dyspnea, diaphoresis, orthopnea, PND, claudication or edema Respiratory: denies cough, dyspnea, DOE, pleurisy, hoarseness, laryngitis or wheezing.  Gastrointestinal: Denies dysphagia, heartburn, reflux, water brash, pain, cramps, nausea, vomiting, bloating, diarrhea, constipation, hematemesis, melena, hematochezia, jaundice or hemorrhoids Genitourinary: Denies dysuria, frequency, urgency, nocturia, hesitancy, discharge, hematuria or flank pain Musculoskeletal: Denies arthralgia, myalgia, stiffness, Jt. Swelling, pain, limp or strain/sprain. Denies Falls. Skin: Denies puritis, rash, hives, warts, acne, eczema or change in skin lesion Neuro: No weakness, tremor, incoordination, spasms, paresthesia or pain Psychiatric: Denies confusion, memory loss or sensory loss. Denies Depression. Endocrine: Denies change in weight, skin, hair change, nocturia, and paresthesia, diabetic polys, visual blurring or hyper / hypo glycemic episodes.  Heme/Lymph: No excessive bleeding, bruising or enlarged lymph nodes.   Physical Exam  There were no vitals taken for this visit.  General Appearance: Well nourished and well groomed and in no apparent distress.  Eyes: PERRLA, EOMs, conjunctiva no swelling or erythema, normal fundi and vessels. Sinuses: No frontal/maxillary tenderness ENT/Mouth: EACs patent / TMs  nl. Nares clear without erythema, swelling, mucoid exudates. Oral hygiene is good. No erythema, swelling, or exudate. Tongue normal, non-obstructing. Tonsils not swollen or erythematous. Hearing normal.  Neck: Supple, thyroid not palpable. No bruits, nodes or JVD. Respiratory: Respiratory effort normal.  BS equal and clear  bilateral without rales, rhonci, wheezing or stridor. Cardio: Heart sounds are normal with regular rate and rhythm and no murmurs, rubs or gallops. Peripheral pulses are normal and equal bilaterally without edema. No aortic or femoral bruits. Chest: symmetric with normal excursions and percussion.  Abdomen: Soft, with Nl bowel sounds. Nontender, no guarding, rebound, hernias, masses, or organomegaly.  Lymphatics: Non tender without lymphadenopathy.  Musculoskeletal: Full ROM all peripheral extremities, joint stability, 5/5 strength, and normal gait. Skin: Warm and dry without rashes, lesions, cyanosis, clubbing or  ecchymosis.  Neuro: Cranial nerves intact, reflexes equal bilaterally. Normal muscle tone, no cerebellar symptoms. Sensation intact.  Pysch: Alert and oriented X 3 with normal affect, insight and judgment appropriate.   Assessment and Plan  1. Annual Preventative/Screening Exam            Patient was counseled in prudent diet, weight control to achieve/maintain BMI less than 25, BP monitoring, regular exercise and medications as discussed.  Discussed med effects and SE's. Routine screening labs  and tests as requested with regular follow-up as recommended. Over 40 minutes of exam, counseling, chart review and high complex critical decision making was performed   Kirtland Bouchard, MD

## 2022-04-21 ENCOUNTER — Ambulatory Visit (INDEPENDENT_AMBULATORY_CARE_PROVIDER_SITE_OTHER): Payer: Medicare Other | Admitting: Internal Medicine

## 2022-04-21 ENCOUNTER — Encounter: Payer: Self-pay | Admitting: Internal Medicine

## 2022-04-21 VITALS — BP 112/66 | HR 65 | Temp 97.9°F | Resp 16 | Ht 68.25 in | Wt 172.8 lb

## 2022-04-21 DIAGNOSIS — Z8249 Family history of ischemic heart disease and other diseases of the circulatory system: Secondary | ICD-10-CM

## 2022-04-21 DIAGNOSIS — R7309 Other abnormal glucose: Secondary | ICD-10-CM | POA: Diagnosis not present

## 2022-04-21 DIAGNOSIS — Z125 Encounter for screening for malignant neoplasm of prostate: Secondary | ICD-10-CM | POA: Diagnosis not present

## 2022-04-21 DIAGNOSIS — R0989 Other specified symptoms and signs involving the circulatory and respiratory systems: Secondary | ICD-10-CM

## 2022-04-21 DIAGNOSIS — Z79899 Other long term (current) drug therapy: Secondary | ICD-10-CM

## 2022-04-21 DIAGNOSIS — E559 Vitamin D deficiency, unspecified: Secondary | ICD-10-CM

## 2022-04-21 DIAGNOSIS — E782 Mixed hyperlipidemia: Secondary | ICD-10-CM

## 2022-04-21 DIAGNOSIS — F419 Anxiety disorder, unspecified: Secondary | ICD-10-CM

## 2022-04-21 DIAGNOSIS — Z136 Encounter for screening for cardiovascular disorders: Secondary | ICD-10-CM

## 2022-04-21 DIAGNOSIS — I700 Atherosclerosis of aorta: Secondary | ICD-10-CM

## 2022-04-21 DIAGNOSIS — Z0001 Encounter for general adult medical examination with abnormal findings: Secondary | ICD-10-CM

## 2022-04-21 DIAGNOSIS — Z0000 Encounter for general adult medical examination without abnormal findings: Secondary | ICD-10-CM

## 2022-04-21 DIAGNOSIS — N138 Other obstructive and reflux uropathy: Secondary | ICD-10-CM

## 2022-04-21 MED ORDER — ESCITALOPRAM OXALATE 20 MG PO TABS: ORAL_TABLET | ORAL | 3 refills | Status: DC

## 2022-04-21 NOTE — Addendum Note (Signed)
Addended by: Unk Pinto on: 04/21/2022 08:58 PM   Modules accepted: Orders

## 2022-04-22 LAB — COMPLETE METABOLIC PANEL WITH GFR
AG Ratio: 2 (calc) (ref 1.0–2.5)
ALT: 17 U/L (ref 9–46)
AST: 14 U/L (ref 10–35)
Albumin: 4.3 g/dL (ref 3.6–5.1)
Alkaline phosphatase (APISO): 76 U/L (ref 35–144)
BUN: 20 mg/dL (ref 7–25)
CO2: 26 mmol/L (ref 20–32)
Calcium: 9 mg/dL (ref 8.6–10.3)
Chloride: 104 mmol/L (ref 98–110)
Creat: 1.28 mg/dL (ref 0.70–1.35)
Globulin: 2.2 g/dL (calc) (ref 1.9–3.7)
Glucose, Bld: 84 mg/dL (ref 65–99)
Potassium: 4.5 mmol/L (ref 3.5–5.3)
Sodium: 140 mmol/L (ref 135–146)
Total Bilirubin: 0.6 mg/dL (ref 0.2–1.2)
Total Protein: 6.5 g/dL (ref 6.1–8.1)
eGFR: 62 mL/min/{1.73_m2} (ref >=60)

## 2022-04-22 LAB — VITAMIN D 25 HYDROXY (VIT D DEFICIENCY, FRACTURES): Vit D, 25-Hydroxy: 87 ng/mL (ref 30–100)

## 2022-04-22 LAB — MICROALBUMIN / CREATININE URINE RATIO
Creatinine, Urine: 152 mg/dL (ref 20–320)
Microalb Creat Ratio: 2 mcg/mg creat (ref <30)
Microalb, Ur: 0.3 mg/dL

## 2022-04-22 LAB — HEMOGLOBIN A1C
Hgb A1c MFr Bld: 5.6 % of total Hgb (ref <5.7)
Mean Plasma Glucose: 114 mg/dL
eAG (mmol/L): 6.3 mmol/L

## 2022-04-22 LAB — URINALYSIS, ROUTINE W REFLEX MICROSCOPIC
Bilirubin Urine: NEGATIVE
Glucose, UA: NEGATIVE
Hgb urine dipstick: NEGATIVE
Ketones, ur: NEGATIVE
Leukocytes,Ua: NEGATIVE
Nitrite: NEGATIVE
Protein, ur: NEGATIVE
Specific Gravity, Urine: 1.023 (ref 1.001–1.035)
pH: 5.5 (ref 5.0–8.0)

## 2022-04-22 LAB — CBC WITH DIFFERENTIAL/PLATELET
Absolute Monocytes: 491 cells/uL (ref 200–950)
Basophils Absolute: 32 cells/uL (ref 0–200)
Basophils Relative: 0.6 %
Eosinophils Absolute: 130 cells/uL (ref 15–500)
Eosinophils Relative: 2.4 %
HCT: 38.9 % (ref 38.5–50.0)
Hemoglobin: 13.5 g/dL (ref 13.2–17.1)
Lymphs Abs: 1415 cells/uL (ref 850–3900)
MCH: 32.5 pg (ref 27.0–33.0)
MCHC: 34.7 g/dL (ref 32.0–36.0)
MCV: 93.7 fL (ref 80.0–100.0)
MPV: 10 fL (ref 7.5–12.5)
Monocytes Relative: 9.1 %
Neutro Abs: 3332 cells/uL (ref 1500–7800)
Neutrophils Relative %: 61.7 %
Platelets: 306 10*3/uL (ref 140–400)
RBC: 4.15 10*6/uL — ABNORMAL LOW (ref 4.20–5.80)
RDW: 12.3 % (ref 11.0–15.0)
Total Lymphocyte: 26.2 %
WBC: 5.4 10*3/uL (ref 3.8–10.8)

## 2022-04-22 LAB — LIPID PANEL
Cholesterol: 217 mg/dL — ABNORMAL HIGH (ref <200)
HDL: 45 mg/dL (ref >=40)
LDL Cholesterol (Calc): 137 mg/dL (calc) — ABNORMAL HIGH
Non-HDL Cholesterol (Calc): 172 mg/dL (calc) — ABNORMAL HIGH (ref <130)
Total CHOL/HDL Ratio: 4.8 (calc) (ref <5.0)
Triglycerides: 201 mg/dL — ABNORMAL HIGH (ref <150)

## 2022-04-22 LAB — PSA: PSA: 1.46 ng/mL (ref <=4.00)

## 2022-04-22 LAB — TSH: TSH: 2.14 mIU/L (ref 0.40–4.50)

## 2022-04-22 LAB — MAGNESIUM: Magnesium: 2.1 mg/dL (ref 1.5–2.5)

## 2022-04-22 LAB — INSULIN, RANDOM: Insulin: 8.7 u[IU]/mL

## 2022-04-22 NOTE — Progress Notes (Signed)
<><><><><><><><><><><><><><><><><><><><><><><><><><><><><><><><><> <><><><><><><><><><><><><><><><><><><><><><><><><><><><><><><><><> - Test results slightly outside the reference range are not unusual. If there is anything important, I will review this with you,  otherwise it is considered normal test values.  If you have further questions,  please do not hesitate to contact me at the office or via My Chart.  <><><><><><><><><><><><><><><><><><><><><><><><><><><><><><><><><> <><><><><><><><><><><><><><><><><><><><><><><><><><><><><><><><><>  -  Total  Chol =  217    - Elevated             (  Ideal  or  Goal is less than 180  !  )  & -  Bad / Dangerous LDL  Chol =   137 - also Elevated              (  Ideal  or  Goal is less than 70  !  )   - Both are high risk & strongly recommend   - A    stricter  low cholesterol diet   - Cholesterol only comes from animal sources                                                            - ie. meat, dairy, egg yolks  - Eat all the vegetables you want.  - Avoid Meat, Avoid Meat,  Avoid Meat                                             - especially Red Meat - Beef AND Pork .  - Avoid cheese & dairy - milk & ice cream.     - Cheese is the most concentrated form of trans-fats which                                                            is the worst thing to clog up our arteries.   - Veggie cheese is OK which can be found in                                                  the fresh produce section at                                                        [facility] or [employer] or [employer] <><><><><><><><><><><><><><><><><><><><><><><><><><><><><><><><><> <><><><><><><><><><><><><><><><><><><><><><><><><><><><><><><><><>  -  Will recheck at Jan 15  Office Visit &                                                  If not significantly better,  will need to start meds to help  lower cholesterol  <><><><><><><><><><><><><><><><><><><><><><><><><><><><><><><><><> <><><><><><><><><><><><><><><><><><><><><><><><><><><><><><><><><>  -  also Triglycerides (   201  ) or fats in blood are too high                 (   Ideal or  Goal is less than 150  !  )    - Recommend avoid fried & greasy foods,  sweets / candy,   - Avoid white rice  (brown or wild rice or Quinoa is OK),   - Avoid white potatoes  (sweet potatoes are OK)   - Avoid anything made from white flour  - bagels, doughnuts, rolls, buns, biscuits, white and   wheat breads, pizza crust and traditional  pasta made of white flour & egg white  - (vegetarian pasta or spinach or wheat pasta is OK).    - Multi-grain bread is OK - like multi-grain flat bread or  sandwich thins.   - Avoid alcohol in excess.   - Exercise is also important. <><><><><><><><><><><><><><><><><><><><><><><><><><><><><><><><><> <><><><><><><><><><><><><><><><><><><><><><><><><><><><><><><><><>  -  PSA - Low - Great - No Prostate cancer  <><><><><><><><><><><><><><><><><><><><><><><><><><><><><><><><><> <><><><><><><><><><><><><><><><><><><><><><><><><><><><><><><><><>  -  A1c - Normal - No Diabetes  - Great !  <><><><><><><><><><><><><><><><><><><><><><><><><><><><><><><><><> <><><><><><><><><><><><><><><><><><><><><><><><><><><><><><><><><>  -  Vitamin D = 87 - Excellent - Please keep dosing same  <><><><><><><><><><><><><><><><><><><><><><><><><><><><><><><><><> <><><><><><><><><><><><><><><><><><><><><><><><><><><><><><><><><>  -  All Else - CBC - Kidneys - Electrolytes - Liver - Magnesium & Thyroid    - all  Normal / OK <><><><><><><><><><><><><><><><><><><><><><><><><><><><><><><><><> <><><><><><><><><><><><><><><><><><><><><><><><><><><><><><><><><>

## 2022-04-23 ENCOUNTER — Ambulatory Visit: Payer: Medicare Other | Admitting: Physician Assistant

## 2022-04-27 ENCOUNTER — Encounter: Payer: Self-pay | Admitting: Internal Medicine

## 2022-04-29 ENCOUNTER — Other Ambulatory Visit (INDEPENDENT_AMBULATORY_CARE_PROVIDER_SITE_OTHER): Payer: Medicare Other

## 2022-04-29 ENCOUNTER — Other Ambulatory Visit: Payer: Self-pay

## 2022-04-29 ENCOUNTER — Telehealth: Payer: Self-pay | Admitting: Gastroenterology

## 2022-04-29 DIAGNOSIS — R140 Abdominal distension (gaseous): Secondary | ICD-10-CM

## 2022-04-29 DIAGNOSIS — K5150 Left sided colitis without complications: Secondary | ICD-10-CM

## 2022-04-29 LAB — CBC WITH DIFFERENTIAL/PLATELET
Basophils Absolute: 0 10*3/uL (ref 0.0–0.1)
Basophils Relative: 0.5 % (ref 0.0–3.0)
Eosinophils Absolute: 0.1 10*3/uL (ref 0.0–0.7)
Eosinophils Relative: 1.5 % (ref 0.0–5.0)
HCT: 40.1 % (ref 39.0–52.0)
Hemoglobin: 13.6 g/dL (ref 13.0–17.0)
Lymphocytes Relative: 30.8 % (ref 12.0–46.0)
Lymphs Abs: 1.9 10*3/uL (ref 0.7–4.0)
MCHC: 33.8 g/dL (ref 30.0–36.0)
MCV: 93.4 fl (ref 78.0–100.0)
Monocytes Absolute: 0.7 10*3/uL (ref 0.1–1.0)
Monocytes Relative: 10.6 % (ref 3.0–12.0)
Neutro Abs: 3.5 10*3/uL (ref 1.4–7.7)
Neutrophils Relative %: 56.6 % (ref 43.0–77.0)
Platelets: 305 10*3/uL (ref 150.0–400.0)
RBC: 4.29 Mil/uL (ref 4.22–5.81)
RDW: 13.4 % (ref 11.5–15.5)
WBC: 6.1 10*3/uL (ref 4.0–10.5)

## 2022-04-29 LAB — C-REACTIVE PROTEIN: CRP: <1 mg/dL (ref 0.5–20.0)

## 2022-04-29 NOTE — Telephone Encounter (Signed)
This is a patient of Dr Tarri Glenn. I will forward the message to her nurse.

## 2022-04-29 NOTE — Telephone Encounter (Signed)
Inbound call from patient spouse stating the patient is having a really hard time with his symptoms that is causing him to be weak and not able to work. Please give a call to further advise.  Thank you

## 2022-04-29 NOTE — Telephone Encounter (Signed)
Orders for labs placed in Epic: Pt made aware to come by our lab to have those drawn and obtain stool specimen kit: Pt verbalized understanding with all questions answered.  Schedules for early appointment reviewed. No earlier appointment at this time:  Pt verbalized understanding with all questions answered.

## 2022-04-29 NOTE — Telephone Encounter (Signed)
Pt stated that for the last month and a half he feels that he is having a UC flare. Main symptoms pt states are Bloating, Belching, Tiredness. BM pattern are  normal: No diarrhea nor constipation: Pt has appointment on 05/11/2022 at 3:30 to see Dr. Tarri Glenn. Pt requesting is there anything that can be done prior: Please advise

## 2022-04-30 ENCOUNTER — Other Ambulatory Visit: Payer: Medicare Other

## 2022-04-30 DIAGNOSIS — R140 Abdominal distension (gaseous): Secondary | ICD-10-CM

## 2022-04-30 DIAGNOSIS — K5150 Left sided colitis without complications: Secondary | ICD-10-CM | POA: Diagnosis not present

## 2022-05-04 ENCOUNTER — Encounter: Payer: Self-pay | Admitting: Gastroenterology

## 2022-05-04 ENCOUNTER — Telehealth: Payer: Self-pay | Admitting: Gastroenterology

## 2022-05-04 LAB — GI PROFILE, STOOL, PCR

## 2022-05-04 NOTE — Telephone Encounter (Signed)
Refer to result note 04/30/22.

## 2022-05-04 NOTE — Telephone Encounter (Signed)
Patient requesting results from his recent labs. Please advise

## 2022-05-05 ENCOUNTER — Other Ambulatory Visit: Payer: Self-pay

## 2022-05-05 LAB — CALPROTECTIN, FECAL: Calprotectin, Fecal: 36 ug/g (ref 0–120)

## 2022-05-05 MED ORDER — SULFAMETHOXAZOLE-TRIMETHOPRIM 800-160 MG PO TABS: 1.0000 | ORAL_TABLET | Freq: Two times a day (BID) | ORAL | 0 refills | Status: AC

## 2022-05-07 ENCOUNTER — Other Ambulatory Visit: Payer: Self-pay | Admitting: Nurse Practitioner

## 2022-05-07 DIAGNOSIS — Z79899 Other long term (current) drug therapy: Secondary | ICD-10-CM

## 2022-05-07 DIAGNOSIS — K2100 Gastro-esophageal reflux disease with esophagitis, without bleeding: Secondary | ICD-10-CM

## 2022-05-11 ENCOUNTER — Encounter: Payer: Self-pay | Admitting: Gastroenterology

## 2022-05-11 ENCOUNTER — Ambulatory Visit: Payer: Medicare Other | Admitting: Gastroenterology

## 2022-05-11 VITALS — BP 122/74 | HR 70 | Ht 68.25 in | Wt 170.1 lb

## 2022-05-11 DIAGNOSIS — R140 Abdominal distension (gaseous): Secondary | ICD-10-CM | POA: Diagnosis not present

## 2022-05-11 DIAGNOSIS — D509 Iron deficiency anemia, unspecified: Secondary | ICD-10-CM | POA: Diagnosis not present

## 2022-05-11 DIAGNOSIS — K5150 Left sided colitis without complications: Secondary | ICD-10-CM | POA: Diagnosis not present

## 2022-05-11 DIAGNOSIS — R109 Unspecified abdominal pain: Secondary | ICD-10-CM

## 2022-05-11 NOTE — Progress Notes (Signed)
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 male returns in follow-up, worked in after he called earlier this month with concerns for a UC flare presenting with bloating, eructation, and fatigue.   He has a history of left-sided segemtal colitis in 2013 treated with Azulfidine. Resumed Lialda 2.4g daily after a 4-5 year hiatus without significant improvement in symptoms of bloody diarrhea up to 6-7 daily with bloating and gas. Colonoscopy 07/22/20 revealed moderately active colitis from the anal verge to 30 cm.  Symptoms improved with Lialda 4.8 g daily and Rowasa 4g enemas PR BID x 2 weeks, then reduce to once daily. Having a formed BM 2-3 times daily. No blood or mucous. Rare urgency. He is satisfied with his results.  Had appendix removed in July 2023. Has had progressive bloating, eructation, and constipation since that time. Called the office thinking he was having a UC flare.  Stool positive for Yersinia enterocolitica and he felt some better after treatment with antibiotics. Abdominal x-ray and fecal calprotectin were normal. He feels like his bowel habits are now  back to normal.  Appetite is good. Weight is stable.   Labs 12/21: fecal calprotectin 508, negative GI pathogen panel, ESR 21, CRP 1, Hemoglobin 12.3, MCV 92.7, RDW 13.3, platelets 315 Labs 08/21/20: WBC 7, hgb 12.2, MCV 90, RDW 13.7, platelets 321, ferritin 34.5, iron 53 Labs 03/12/22: iron 139, ferritin 30, percent saturation 44 Labs 04/21/22: normal liver enzymes, normal CBC, normal TSH Labs 04/30/22: fecal calprotectin 36  Endoscopic history: Colonoscopy 02/02/2005 for hematochezia: 10 mm hyperplastic rectal polyp Colonoscopy 02/11/2009: colitis from 25 to 45 cm with rectal sparing. Biopsies most consistent with UC. Rectal biopsies not obtained.  Started on sulfasalazine. Colonoscopy 04/29/2012: Internal hemorrhoids.  Right sided colon biopsies were normal.  Left sided colon biopsies from 30 to 50 cm showed mild chronic active inflammation with mild cryptitis but no granulomata.  Rectal biopsies were normal. Colonoscopy 07/22/20: moderately active colitis from the anal verge to 30 cm.   No known family history of colon cancer or polyps. No family history of uterine/endometrial cancer, pancreatic cancer or gastric/stomach cancer.   Past Medical History:  Diagnosis Date   Anemia    Hyperlipidemia    Internal hemorrhoids    Ulcerative colitis, left sided ([place])    Vitamin D deficiency     Past Surgical History:  Procedure Laterality Date   LAPAROSCOPIC APPENDECTOMY N/A 02/06/2022   Procedure: APPENDECTOMY LAPAROSCOPIC;  Surgeon: Kinsinger, Arta Bruce, MD;  Location: [facility];  Service: General;  Laterality: N/A;    Current Outpatient Medications  Medication Sig Dispense Refill   acetaminophen (TYLENOL) 500 MG tablet Take 2 tablets (1,000 mg  total) by mouth every 8 (eight) hours as needed for mild pain. 30 tablet 0   aspirin 81 MG tablet Take 81 mg by mouth daily.     Cholecalciferol (VITAMIN D PO) Take 6,000 Int'l Units by mouth daily.     mesalamine (LIALDA) 1.2 g EC tablet Take 4 tablets  (4.8 g total) by mouth daily. 360 tablet 3   Multiple Vitamins-Minerals (MULTIVITAMIN PO) Take by mouth. Take 1 tab daily     OVER THE COUNTER MEDICATION Flaxseed oil 1 capsule daily.     pantoprazole (PROTONIX) 40 MG tablet TAKE 1 TABLET BY MOUTH DAILY 30 tablet 1   No current facility-administered medications for this visit.    Allergies as of 05/11/2022   (No Known Allergies)    Family History  Problem Relation Age of Onset   Lung cancer Mother    Coronary artery disease Father    Colon cancer Neg Hx    Esophageal cancer Neg Hx    Stomach cancer Neg Hx    Rectal cancer Neg Hx    Pancreatic cancer Neg Hx      Physical Exam: General:   Alert,  well-nourished, pleasant and cooperative in NAD Head:  Normocephalic and atraumatic. Eyes:  Sclera clear, no icterus.   Conjunctiva pink. Abdomen:  Soft, nontender, nondistended, normal bowel sounds, no rebound or guarding. No hepatosplenomegaly.   Rectal:  Deferred  Msk:  Symmetrical. No boney deformities LAD: No inguinal or umbilical LAD Extremities:  No clubbing or edema. Neurologic:  Alert and  oriented x4;  grossly nonfocal Skin:  Intact without significant lesions or rashes. Psych:  Alert and cooperative. Normal mood and affect.     Gresia Isidoro L. Tarri Glenn, MD, MPH 05/26/2022, 4:55 PM

## 2022-05-11 NOTE — Patient Instructions (Addendum)
IMAGING:  You will be contacted by [facility] (Your caller ID will indicate phone # [phone]) within the next business 7-10 business days to schedule your CT Abdomen and Pelvis. If you have not heard from them within 7-10 business days, please call [facility] at [phone] to follow up on the status of your appointment.    Continue Lialda 4.8 g daily.   Start daily Iron supplement.   _______________________________________________________  If you are age 65 or older, your body mass index should be between 23-30. Your Body mass index is 25.68 kg/m. If this is out of the aforementioned range listed, please consider follow up with your Primary Care Provider.  If you are age 25 or younger, your body mass index should be between 19-25. Your Body mass index is 25.68 kg/m. If this is out of the aformentioned range listed, please consider follow up with your Primary Care Provider.   ________________________________________________________  The [facility] providers would like to encourage you to use [facility] to communicate with providers for non-urgent requests or questions.  Due to long hold times on the telephone, sending your provider a message by [facility] may be a faster and more efficient way to get a response.  Please allow 48 business hours for a response.  Please remember that this is for non-urgent requests.  _______________________________________________________

## 2022-05-14 ENCOUNTER — Other Ambulatory Visit: Payer: Self-pay

## 2022-05-14 DIAGNOSIS — K5150 Left sided colitis without complications: Secondary | ICD-10-CM

## 2022-05-20 ENCOUNTER — Encounter: Payer: Self-pay | Admitting: Gastroenterology

## 2022-05-25 ENCOUNTER — Ambulatory Visit (HOSPITAL_COMMUNITY)
Admission: RE | Admit: 2022-05-25 | Discharge: 2022-05-25 | Disposition: A | Discharge status: Home / Self Care | Payer: Medicare Other | Source: Ambulatory Visit | Attending: Gastroenterology | Admitting: Gastroenterology

## 2022-05-25 DIAGNOSIS — R109 Unspecified abdominal pain: Secondary | ICD-10-CM | POA: Diagnosis not present

## 2022-05-25 DIAGNOSIS — R140 Abdominal distension (gaseous): Secondary | ICD-10-CM | POA: Insufficient documentation

## 2022-05-25 DIAGNOSIS — K6389 Other specified diseases of intestine: Secondary | ICD-10-CM | POA: Diagnosis not present

## 2022-05-25 MED ORDER — SODIUM CHLORIDE (PF) 0.9 % IJ SOLN
INTRAMUSCULAR | Status: AC
  Filled 2022-05-25: qty 50

## 2022-05-25 MED ORDER — IOHEXOL 300 MG/ML  SOLN
100.0000 mL | Freq: Once | INTRAMUSCULAR | Status: AC | PRN
  Administered 2022-05-25: 100 mL via INTRAVENOUS

## 2022-05-26 ENCOUNTER — Encounter: Payer: Self-pay | Admitting: Gastroenterology

## 2022-05-26 ENCOUNTER — Encounter: Payer: Medicare Other | Admitting: Gastroenterology

## 2022-06-01 ENCOUNTER — Encounter: Payer: Self-pay | Admitting: Gastroenterology

## 2022-06-02 ENCOUNTER — Other Ambulatory Visit: Payer: Self-pay | Admitting: Internal Medicine

## 2022-06-02 DIAGNOSIS — F418 Other specified anxiety disorders: Secondary | ICD-10-CM

## 2022-06-02 MED ORDER — BUSPIRONE HCL 5 MG PO TABS: ORAL_TABLET | ORAL | 0 refills | Status: DC

## 2022-06-02 MED ORDER — FLAX SEED OIL 1000 MG PO CAPS: ORAL_CAPSULE | ORAL | 0 refills | Status: DC

## 2022-06-10 ENCOUNTER — Telehealth: Payer: Self-pay

## 2022-06-10 ENCOUNTER — Ambulatory Visit (AMBULATORY_SURGERY_CENTER): Payer: Medicare Other | Admitting: Gastroenterology

## 2022-06-10 ENCOUNTER — Encounter: Payer: Self-pay | Admitting: Gastroenterology

## 2022-06-10 VITALS — BP 113/71 | HR 54 | Temp 98.6°F | Resp 12 | Ht 68.0 in | Wt 170.0 lb

## 2022-06-10 DIAGNOSIS — R109 Unspecified abdominal pain: Secondary | ICD-10-CM

## 2022-06-10 DIAGNOSIS — K5150 Left sided colitis without complications: Secondary | ICD-10-CM

## 2022-06-10 DIAGNOSIS — R140 Abdominal distension (gaseous): Secondary | ICD-10-CM | POA: Diagnosis not present

## 2022-06-10 DIAGNOSIS — Z8719 Personal history of other diseases of the digestive system: Secondary | ICD-10-CM

## 2022-06-10 MED ORDER — SODIUM CHLORIDE 0.9 % IV SOLN: 500.0000 mL | Freq: Once | INTRAVENOUS | Status: DC

## 2022-06-10 MED ORDER — PANTOPRAZOLE SODIUM 40 MG PO TBEC: 40.0000 mg | DELAYED_RELEASE_TABLET | Freq: Two times a day (BID) | ORAL | 3 refills | Status: DC

## 2022-06-10 NOTE — Progress Notes (Unsigned)
Called to room to assist during endoscopic procedure.  Patient ID and intended procedure confirmed with present staff. Received instructions for my participation in the procedure from the performing physician.  

## 2022-06-10 NOTE — Progress Notes (Unsigned)
Indication for colonoscopy: abdominal pain and bloating, history of left-sided colitis in 2013  See my 05/11/22 office not for complete details. There has been no significant change in history of physical exam. He remains an appropriate candidate for monitored anesthesia care in the [facility].

## 2022-06-10 NOTE — Patient Instructions (Addendum)
Impression/Recommendations:  Resume previous diet. Continue present medications. Await pathology results.  Repeat colonoscopy in 10 years for surveillance, earlier if symptoms.  Book appointment to see Dr. Tarri Glenn at next available.  YOU HAD AN ENDOSCOPIC PROCEDURE TODAY AT [facility]:   Refer to the procedure report that was given to you for any specific questions about what was found during the examination.  If the procedure report does not answer your questions, please call your gastroenterologist to clarify.  If you requested that your care partner not be given the details of your procedure findings, then the procedure report has been included in a sealed envelope for you to review at your convenience later.  YOU SHOULD EXPECT: Some feelings of bloating in the abdomen. Passage of more gas than usual.  Walking can help get rid of the air that was put into your GI tract during the procedure and reduce the bloating. If you had a lower endoscopy (such as a colonoscopy or flexible sigmoidoscopy) you may notice spotting of blood in your stool or on the toilet paper. If you underwent a bowel prep for your procedure, you may not have a normal bowel movement for a few days.  Please Note:  You might notice some irritation and congestion in your nose or some drainage.  This is from the oxygen used during your procedure.  There is no need for concern and it should clear up in a day or so.  SYMPTOMS TO REPORT IMMEDIATELY:  Following lower endoscopy (colonoscopy or flexible sigmoidoscopy):  Excessive amounts of blood in the stool  Significant tenderness or worsening of abdominal pains  Swelling of the abdomen that is new, acute  Fever of 100F or higher   For urgent or emergent issues, a gastroenterologist can be reached at any hour by calling [phone]. Do not use MyChart messaging for urgent concerns.    DIET:  We do recommend a small meal at first, but then you may  proceed to your regular diet.  Drink plenty of fluids but you should avoid alcoholic beverages for 24 hours.  ACTIVITY:  You should plan to take it easy for the rest of today and you should NOT DRIVE or use heavy machinery until tomorrow (because of the sedation medicines used during the test).    FOLLOW UP: Our staff will call the number listed on your records the next business day following your procedure.  We will call around 7:15- 8:00 am to check on you and address any questions or concerns that you may have regarding the information given to you following your procedure. If we do not reach you, we will leave a message.     If any biopsies were taken you will be contacted by phone or by letter within the next 1-3 weeks.  Please call us at [phone] if you have not heard about the biopsies in 3 weeks.    SIGNATURES/CONFIDENTIALITY: You and/or your care partner have signed paperwork which will be entered into your electronic medical record.  These signatures attest to the fact that that the information above on your After Visit Summary has been reviewed and is understood.  Full responsibility of the confidentiality of this discharge information lies with you and/or your care-partner.

## 2022-06-10 NOTE — Op Note (Signed)
[facility] Patient Name: Jeffrey Gallagher Procedure Date: 06/10/2022 3:30 PM MRN: [id number] Endoscopist: Thornton Park MD, MD, [id number] Age: 66 Referring MD:  Date of Birth: [birth year] Gender: Male Account #: [id number] Procedure:                Colonoscopy Indications:              Change in bowel habits                           History of left-sided segemtal colitis in 2013                            treated with Azulfidine Medicines:                Monitored Anesthesia Care Procedure:                Pre-Anesthesia Assessment:                           - Prior to the procedure, a History and Physical                            was performed, and patient medications and                            allergies were reviewed. The patient's tolerance of                            previous anesthesia was also reviewed. The risks                            and benefits of the procedure and the sedation                            options and risks were discussed with the patient.                            All questions were answered, and informed consent                            was obtained. Prior Anticoagulants: The patient has                            taken no anticoagulant or antiplatelet agents. ASA                            Grade Assessment: II - A patient with mild systemic                            disease. After reviewing the risks and benefits,                            the patient was deemed in satisfactory condition to  undergo the procedure.                           After obtaining informed consent, the colonoscope                            was passed under direct vision. Throughout the                            procedure, the patient's blood pressure, pulse, and                            oxygen saturations were monitored continuously. The                            Olympus CF-HQ190L (Serial# [id number]) Colonoscope was                             introduced through the anus and advanced to the 5                            cm into the ileum. A second forward view of the                            right colon was performed. The colonoscopy was                            performed without difficulty. The patient tolerated                            the procedure well. The quality of the bowel                            preparation was good. The terminal ileum, ileocecal                            valve, appendiceal orifice, and rectum were                            photographed. Scope In: 3:38:00 PM Scope Out: 3:52:29 PM Scope Withdrawal Time: 0 hours 11 minutes 38 seconds  Total Procedure Duration: 0 hours 14 minutes 29 seconds  Findings:                 The perianal and digital rectal examinations were                            normal.                           The colon (entire examined portion) appeared                            normal. Biopsies were taken from the rectum, left  colon, transverse colon, and right colon with a                            cold forceps for histology. Estimated blood loss                            was minimal.                           The terminal ileum appeared normal. Biopsies were                            taken with a cold forceps for histology. Estimated                            blood loss was minimal.                           The exam was otherwise without abnormality on                            direct and retroflexion views. Complications:            No immediate complications. Estimated Blood Loss:     Estimated blood loss was minimal. Impression:               - The entire examined colon and terminal ileum are                            normal. Biopsied. Recommendation:           - Patient has a contact number available for                            emergencies. The signs and symptoms of potential                            delayed  complications were discussed with the                            patient. Return to normal activities tomorrow.                            Written discharge instructions were provided to the                            patient.                           - Resume previous diet.                           - Continue present medications.                           - Await pathology results.                           -  Repeat colonoscopy in 10 years for surveillance,                            earlier with new symptoms.                           - Emerging evidence supports eating a diet of                            fruits, vegetables, grains, calcium, and yogurt                            while reducing red meat and alcohol may reduce the                            risk of colon cancer. Thornton Park MD, MD 06/10/2022 4:01:14 PM This report has been signed electronically.

## 2022-06-10 NOTE — Telephone Encounter (Signed)
OV scheduled for next available 08/03/22 at 9:10 am with Dr. Tarri Glenn. Pt notified via mychart.

## 2022-06-10 NOTE — Telephone Encounter (Signed)
-----   Message from Thornton Park, MD sent at 06/10/2022  4:29 PM EST ----- Please schedule office visit with me in 4-6 weeks.  Thanks.  KLB

## 2022-06-10 NOTE — Progress Notes (Signed)
Instructed Pt./care partner to contact Dr. Tarri Glenn office for follow-up appointment.

## 2022-06-10 NOTE — Progress Notes (Unsigned)
Report to PACU, RN, vss, BBS= Clear.  

## 2022-06-11 ENCOUNTER — Telehealth: Payer: Self-pay | Admitting: *Deleted

## 2022-06-11 NOTE — Telephone Encounter (Signed)
Attempted to call patient for their post-procedure follow-up call. No answer. Left voicemail.   

## 2022-06-16 ENCOUNTER — Encounter: Payer: Self-pay | Admitting: Gastroenterology

## 2022-06-25 DIAGNOSIS — C44619 Basal cell carcinoma of skin of left upper limb, including shoulder: Secondary | ICD-10-CM | POA: Diagnosis not present

## 2022-06-25 DIAGNOSIS — D485 Neoplasm of uncertain behavior of skin: Secondary | ICD-10-CM | POA: Diagnosis not present

## 2022-07-04 ENCOUNTER — Other Ambulatory Visit: Payer: Self-pay | Admitting: Internal Medicine

## 2022-07-04 DIAGNOSIS — F418 Other specified anxiety disorders: Secondary | ICD-10-CM

## 2022-07-04 MED ORDER — BUSPIRONE HCL 5 MG PO TABS: ORAL_TABLET | ORAL | 3 refills | Status: DC

## 2022-07-05 ENCOUNTER — Other Ambulatory Visit: Payer: Self-pay | Admitting: Nurse Practitioner

## 2022-07-20 ENCOUNTER — Encounter: Payer: Self-pay | Admitting: Internal Medicine

## 2022-07-21 ENCOUNTER — Other Ambulatory Visit: Payer: Self-pay | Admitting: Internal Medicine

## 2022-07-23 DIAGNOSIS — C44619 Basal cell carcinoma of skin of left upper limb, including shoulder: Secondary | ICD-10-CM | POA: Diagnosis not present

## 2022-07-23 DIAGNOSIS — D1721 Benign lipomatous neoplasm of skin and subcutaneous tissue of right arm: Secondary | ICD-10-CM | POA: Diagnosis not present

## 2022-07-27 ENCOUNTER — Ambulatory Visit (INDEPENDENT_AMBULATORY_CARE_PROVIDER_SITE_OTHER): Payer: Medicare Other | Admitting: Nurse Practitioner

## 2022-07-27 ENCOUNTER — Encounter: Payer: Self-pay | Admitting: Nurse Practitioner

## 2022-07-27 VITALS — BP 108/58 | HR 59 | Temp 97.9°F | Ht 68.25 in | Wt 168.2 lb

## 2022-07-27 DIAGNOSIS — E559 Vitamin D deficiency, unspecified: Secondary | ICD-10-CM | POA: Diagnosis not present

## 2022-07-27 DIAGNOSIS — E663 Overweight: Secondary | ICD-10-CM

## 2022-07-27 DIAGNOSIS — R7309 Other abnormal glucose: Secondary | ICD-10-CM

## 2022-07-27 DIAGNOSIS — E782 Mixed hyperlipidemia: Secondary | ICD-10-CM

## 2022-07-27 DIAGNOSIS — Z79899 Other long term (current) drug therapy: Secondary | ICD-10-CM | POA: Diagnosis not present

## 2022-07-27 DIAGNOSIS — F419 Anxiety disorder, unspecified: Secondary | ICD-10-CM

## 2022-07-27 DIAGNOSIS — I700 Atherosclerosis of aorta: Secondary | ICD-10-CM

## 2022-07-27 DIAGNOSIS — R0989 Other specified symptoms and signs involving the circulatory and respiratory systems: Secondary | ICD-10-CM

## 2022-07-27 DIAGNOSIS — J301 Allergic rhinitis due to pollen: Secondary | ICD-10-CM

## 2022-07-27 DIAGNOSIS — K5150 Left sided colitis without complications: Secondary | ICD-10-CM

## 2022-07-27 MED ORDER — FLUTICASONE PROPIONATE 50 MCG/ACT NA SUSP: NASAL | 2 refills | Status: DC

## 2022-07-27 MED ORDER — CITALOPRAM HYDROBROMIDE 10 MG PO TABS: 10.0000 mg | ORAL_TABLET | Freq: Every day | ORAL | 2 refills | Status: DC

## 2022-07-27 NOTE — Progress Notes (Signed)
3 MONTH FOLLOW UP Assessment:   Jeffrey Gallagher was seen today for follow-up and medicare wellness.  Diagnoses and all orders for this visit:   Labile hypertension - continue DASH diet, exercise and monitor at home. Call if greater than 130/80.   -     CBC with Differential/Platelet  Aortic Atherosclerosis(HCC) Control BP, weight, cholesterol and blood sugars  Hyperlipidemia, mixed Continue diet and exercise -     COMPLETE METABOLIC PANEL WITH GFR -     Lipid panel  Abnormal glucose Continue diet and exercise  Vitamin D deficiency Currently in range  Left sided ulcerative (chronic) colitis (HCC) Continue Lialda and follow with Dr. Tarri Glenn  Medication management -     Magnesium  Overweight (BMI 25.0-29.9) Long discussion about weight loss, diet, and exercise Recommended diet heavy in fruits and veggies and low in animal meats, cheeses, and dairy products, appropriate calorie intake Follow up at next visit   Allergic Rhinitis Begin Flonase 2 sparys each nostril once a day Begin generic Zyrtec or Allegra daily fr the next 3-4 weeks  Anxiety Begin Citalopram 10 mg , can start with 1/2 tab is whole tab is too strong Practice relaxation, good sleep hygiene, diet and exercise    Over 30 minutes of exam, counseling, chart review, and critical decision making was performed  Future Appointments  Date Time Provider [place]  08/03/2022  9:10 AM Thornton Park, MD [facility]  11/02/2022  3:30 PM Unk Pinto, MD [facility] None  04/22/2023  2:00 PM Unk Pinto, MD [facility] None     Subjective:  Jeffrey Gallagher is a 67 y.o. male who presents for Medicare Annual Wellness Visit and 3 month follow up for labile HTN, hyperlipidemia, abnormal glucose, left sided ulcerative colitis and vitamin D Def.   He is followed by Dr. Tarri Glenn for ulcerative colitis and is on Lialda. Last colonoscopy was 07/22/20. He has persistent stomach growling and bloating. CT scan was  negative.   He is having a lot of Anxiety and was tried on Buspar and this medication did not help.  He tried Lexapro but believed it made him more anxious. His niece Dianne Dun just passed away.   He had a recent basal cell removed from his left arm.  Stitches are still in place.   His blood pressure has been controlled at home, he is not currently on BP medication, today their BP is BP: (!) 108/58  BP Readings from Last 3 Encounters:  07/27/22 (!) 108/58  06/10/22 113/71  05/11/22 122/74  He does workout. He denies chest pain, shortness of breath, dizziness.   BMI is Body mass index is 25.39 kg/m., he has been working on diet and exercise. Wt Readings from Last 3 Encounters:  07/27/22 168 lb 3.2 oz (76.3 kg)  06/10/22 170 lb (77.1 kg)  05/11/22 170 lb 2 oz (77.2 kg)     He is not on cholesterol medication and denies myalgias. His cholesterol is not at goal. The cholesterol last visit was:   Lab Results  Component Value Date   CHOL 217 (H) 04/21/2022   HDL 45 04/21/2022   LDLCALC 137 (H) 04/21/2022   TRIG 201 (H) 04/21/2022   CHOLHDL 4.8 04/21/2022   He has been working on diet and exercise for abnormal glucose. Last A1C in the office was:  Lab Results  Component Value Date   HGBA1C 5.6 04/21/2022   Last GFR Lab Results  Component Value Date   EGFR 62 04/21/2022  Patient is not on Vitamin D supplement.   Lab Results  Component Value Date   VD25OH 87 04/21/2022      Medication Review:     Current Outpatient Medications (Analgesics):    acetaminophen (TYLENOL) 500 MG tablet, Take 2 tablets (1,000 mg total) by mouth every 8 (eight) hours as needed for mild pain.   aspirin 81 MG tablet, Take 81 mg by mouth daily.   Current Outpatient Medications (Other):    Cholecalciferol (VITAMIN D PO), Take 6,000 Int'l Units by mouth daily.   Flaxseed, Linseed, (FLAX SEED OIL) 1000 MG CAPS, Takes  1 capsule  Daily   mesalamine (LIALDA) 1.2 g EC tablet, Take 4 tablets  (4.8 g total) by mouth daily.   Multiple Vitamins-Minerals (MULTIVITAMIN PO), Take by mouth. Take 1 tab daily   pantoprazole (PROTONIX) 40 MG tablet, TAKE 1 TABLET BY MOUTH DAILY   busPIRone (BUSPAR) 5 MG tablet, Take     1/2 to 1 tablet     3 x /day      for Anxiety & Depression (Patient not taking: Reported on 07/27/2022)  Allergies: No Known Allergies  Current Problems (verified) has Vitamin D deficiency; Mixed hyperlipidemia; ULCERATIVE COLITIS, LEFT SIDED; Hemorrhoids; Elevated PSA; Elevated BP; Medication management; Acute appendicitis; and Aortic atherosclerosis ([place]) by Abd CT scan on 02/03/2022 on their problem list.  Screening Tests Immunization History  Administered Date(s) Administered   Td 07/13/2001   Health Maintenance  Topic Date Due   Medicare Annual Wellness (AWV)  Never done   COVID-19 Vaccine (1) Never done   Zoster Vaccines- Shingrix (1 of 2) Never done   DTaP/Tdap/Td (2 - Tdap) 07/14/2011   INFLUENZA VACCINE  Never done   Pneumonia Vaccine 54+ Years old (1 - PCV) 12/04/2022 (Originally 07/24/2021)   Hepatitis C Screening  12/04/2022 (Originally 07/24/1973)   COLONOSCOPY (Pts 45-73yr Insurance coverage will need to be confirmed)  06/10/2032   HPV VACCINES  Aged Out      Preventative care: Last colonoscopy: 2022 Dr. BTarri Glenn  Names of Other Physician/Practitioners you currently use: 1. [facility] here for primary care 2. Dr. SGershon Crane1/2023, eye doctor, last visit  3. Dr TLovena Le4/2023, dentist, last visit  Patient Care Team: MUnk Pinto MD as PCP - General (Internal Medicine) TLavonna Monarch MD (Inactive) as Consulting Physician (Dermatology)  Surgical: He  has a past surgical history that includes laparoscopic appendectomy (N/A, 02/06/2022). Family His family history includes Coronary artery disease in his father; Lung cancer in his mother. Social history  He reports that he quit smoking about 19 years ago.  His smoking use included cigarettes. He has quit using smokeless tobacco. He reports current alcohol use of about 7.0 standard drinks of alcohol per week. He reports that he does not use drugs.     Objective:   Today's Vitals   07/27/22 1529  BP: (!) 108/58  Pulse: (!) 59  Temp: 97.9 F (36.6 C)  SpO2: 97%  Weight: 168 lb 3.2 oz (76.3 kg)  Height: 5' 8.25" (1.734 m)    Body mass index is 25.39 kg/m.  General appearance: alert, no distress, WD/WN, male HEENT: normocephalic, sclerae anicteric, TMs pearly, small amount of fluid left TM, nares palet, no discharge or erythema, pharynx normal Oral cavity: MMM, no lesions Neck: supple, no lymphadenopathy, no thyromegaly, no masses Heart: RRR, normal S1, S2, no murmurs Lungs: CTA bilaterally, no wheezes, rhonchi, or rales Abdomen: +bs, soft, non tender, non distended, no  masses, no hepatomegaly, no splenomegaly Musculoskeletal: nontender, no swelling, no obvious deformity Extremities: no edema, no cyanosis, no clubbing Pulses: 2+ symmetric, upper and lower extremities, normal cap refill Neurological: alert, oriented x 3, CN2-12 intact, strength normal upper extremities and lower extremities, sensation normal throughout, DTRs 2+ throughout, no cerebellar signs, gait normal Psychiatric: normal affect, behavior normal, pleasant        [name], NP   07/27/2022

## 2022-07-27 NOTE — Patient Instructions (Signed)
Begin citalopram for anxiety, can cut in half if needed  Flonase 2 sprays each nostril once a day Generic Zyrtec or Allegra daily for the next 3-4 weeks  Allergic Rhinitis, Adult Allergic rhinitis is a reaction to allergens. Allergens are things that can cause an allergic reaction. This condition affects the lining inside the nose (mucous membrane). There are two types of allergic rhinitis: Seasonal. This type is also called hay fever. It happens only during some times of the year. Perennial. This type can happen at any time of the year. This condition cannot be spread from person to person (is not contagious). It can be mild, worse, or very bad. It can develop at any age and may be outgrown. What are the causes? This condition may be caused by: Pollen from grasses, trees, and weeds. Dust mites. Smoke. Mold. Car fumes. The pee (urine), spit, or dander of pets. Dander is dead skin cells from a pet. What increases the risk? You are more likely to develop this condition if: You have allergies in your family. You have problems like allergies in your family. You may have: Swelling of parts of your eyes and eyelids. Asthma. This affects how you breathe. Long-term redness and swelling on your skin. Food allergies. What are the signs or symptoms? The main symptom of this condition is a runny or stuffy nose (nasal congestion). Other symptoms may include: Sneezing or coughing. Itching and tearing of your eyes. Mucus that drips down the back of your throat (postnasal drip). Trouble sleeping. Feeling tired. Headache. Sore throat. How is this treated? There is no cure for this condition. You should avoid things that you are allergic to. Treatment can help to relieve symptoms. This may include: Medicines that block allergy symptoms, such as corticosteroids or antihistamines. These may be given as a shot, nasal spray, or pill. Avoiding things you are allergic to. Medicines that give you bits  of what you are allergic to over time. This is called immunotherapy. It is done if other treatments do not help. You may get: Shots. Medicine under your tongue. Stronger medicines, if other treatments do not help. Follow these instructions at home: Avoiding allergens Find out what things you are allergic to and avoid them. To do this, try these things: If you get allergies any time of year: Replace carpet with wood, tile, or vinyl flooring. Carpet can trap pet dander and dust. Do not smoke. Do not allow smoking in your home. Change your heating and air conditioning filters at least once a month. If you get allergies only some times of the year: Keep windows closed when you can. Plan things to do outside when pollen counts are lowest. Check pollen counts before you plan things to do outside. When you come indoors, change your clothes and shower before you sit on furniture or bedding. If you are allergic to a pet: Keep the pet out of your bedroom. Vacuum, sweep, and dust often. General instructions Take over-the-counter and prescription medicines only as told by your doctor. Drink enough fluid to keep your pee (urine) pale yellow. Keep all follow-up visits as told by your doctor. This is important. Where to find more information American Academy of Allergy, Asthma & Immunology: www.aaaai.org Contact a doctor if: You have a fever. You get a cough that does not go away. You make whistling sounds when you breathe (wheeze). Your symptoms slow you down. Your symptoms stop you from doing your normal things each day. Get help right away if: You  are short of breath. This symptom may be an emergency. Do not wait to see if the symptom will go away. Get medical help right away. Call your local emergency services (911 in the [place] Do not drive yourself to the hospital. Summary Allergic rhinitis may be treated by taking medicines and avoiding things you are allergic to. If you have allergies  only some of the year, keep windows closed when you can at those times. Contact your doctor if you get a fever or a cough that does not go away. This information is not intended to replace advice given to you by your health care provider. Make sure you discuss any questions you have with your health care provider. Document Revised: 12/11/2021 Document Reviewed: 06/27/2019 Elsevier Patient Education  [employer].

## 2022-07-28 ENCOUNTER — Encounter: Payer: Self-pay | Admitting: Nurse Practitioner

## 2022-07-28 LAB — CBC WITH DIFFERENTIAL/PLATELET
Absolute Monocytes: 587 cells/uL (ref 200–950)
Basophils Absolute: 17 cells/uL (ref 0–200)
Basophils Relative: 0.3 %
Eosinophils Absolute: 68 cells/uL (ref 15–500)
Eosinophils Relative: 1.2 %
HCT: 37.7 % — ABNORMAL LOW (ref 38.5–50.0)
Hemoglobin: 13.1 g/dL — ABNORMAL LOW (ref 13.2–17.1)
Lymphs Abs: 1670 cells/uL (ref 850–3900)
MCH: 32.3 pg (ref 27.0–33.0)
MCHC: 34.7 g/dL (ref 32.0–36.0)
MCV: 92.9 fL (ref 80.0–100.0)
MPV: 10.2 fL (ref 7.5–12.5)
Monocytes Relative: 10.3 %
Neutro Abs: 3357 cells/uL (ref 1500–7800)
Neutrophils Relative %: 58.9 %
Platelets: 309 10*3/uL (ref 140–400)
RBC: 4.06 10*6/uL — ABNORMAL LOW (ref 4.20–5.80)
RDW: 12.7 % (ref 11.0–15.0)
Total Lymphocyte: 29.3 %
WBC: 5.7 10*3/uL (ref 3.8–10.8)

## 2022-07-28 LAB — COMPLETE METABOLIC PANEL WITH GFR
AG Ratio: 2 (calc) (ref 1.0–2.5)
ALT: 19 U/L (ref 9–46)
AST: 17 U/L (ref 10–35)
Albumin: 4.2 g/dL (ref 3.6–5.1)
Alkaline phosphatase (APISO): 68 U/L (ref 35–144)
BUN: 19 mg/dL (ref 7–25)
CO2: 27 mmol/L (ref 20–32)
Calcium: 9.1 mg/dL (ref 8.6–10.3)
Chloride: 104 mmol/L (ref 98–110)
Creat: 1.18 mg/dL (ref 0.70–1.35)
Globulin: 2.1 g/dL (calc) (ref 1.9–3.7)
Glucose, Bld: 85 mg/dL (ref 65–99)
Potassium: 4.2 mmol/L (ref 3.5–5.3)
Sodium: 139 mmol/L (ref 135–146)
Total Bilirubin: 0.5 mg/dL (ref 0.2–1.2)
Total Protein: 6.3 g/dL (ref 6.1–8.1)
eGFR: 68 mL/min/{1.73_m2} (ref >=60)

## 2022-07-28 LAB — LIPID PANEL
Cholesterol: 212 mg/dL — ABNORMAL HIGH (ref <200)
HDL: 46 mg/dL (ref >=40)
LDL Cholesterol (Calc): 128 mg/dL (calc) — ABNORMAL HIGH
Non-HDL Cholesterol (Calc): 166 mg/dL (calc) — ABNORMAL HIGH (ref <130)
Total CHOL/HDL Ratio: 4.6 (calc) (ref <5.0)
Triglycerides: 249 mg/dL — ABNORMAL HIGH (ref <150)

## 2022-07-28 LAB — MAGNESIUM: Magnesium: 1.8 mg/dL (ref 1.5–2.5)

## 2022-07-30 ENCOUNTER — Ambulatory Visit (INDEPENDENT_AMBULATORY_CARE_PROVIDER_SITE_OTHER): Payer: Medicare Other

## 2022-07-30 ENCOUNTER — Other Ambulatory Visit: Payer: Self-pay | Admitting: Nurse Practitioner

## 2022-07-30 DIAGNOSIS — Z79899 Other long term (current) drug therapy: Secondary | ICD-10-CM | POA: Diagnosis not present

## 2022-07-30 DIAGNOSIS — R710 Precipitous drop in hematocrit: Secondary | ICD-10-CM

## 2022-07-30 NOTE — Progress Notes (Signed)
Patient presents to the office for a nurse visit to have labs done to check Hemoglobin levels. Blood pressure 122/72, pulse 62, temperature 97.9 F (36.6 C), height 5' 8.25" (1.734 m), weight 167 lb (75.8 kg), SpO2 98 %.

## 2022-07-31 LAB — IRON, TOTAL/TOTAL IRON BINDING CAP
%SAT: 56 % (calc) — ABNORMAL HIGH (ref 20–48)
Iron: 175 ug/dL (ref 50–180)
TIBC: 314 mcg/dL (calc) (ref 250–425)

## 2022-07-31 LAB — VITAMIN B12: Vitamin B-12: 808 pg/mL (ref 200–1100)

## 2022-08-02 NOTE — Progress Notes (Signed)
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    - initially diagnosed 2013    - symptoms now improved on Lialda 4.8g daily    - fecal calprotectin 508 prior to increasing daily dose    - fecal calprotectin 30 08/23/20 after resuming treatment    - fecal calprotectin 36 04/30/22 57m pulmonary nodule on CT 11/23    - Follow-up CT planned this spring/summer Iron deficiency without anemia    - PCP stopped iron supplements    - although his iron levels are normal, his ferritin level has been consistently <35     - will defer decision to PCP but consider reinitiating oral iron supplements every other day History of hyperplastic rectal polyp 2006 Acute appendicitis 01/2022 No known family history of colon cancer or polyps   PLAN: - Continue Lialda 4.8 g daily - Xifaxan 550 mg TID x 14 days, may substitute with doxycyline 100 mg BID - Continue pantoprazole 40 mg daily - Consider trial of dicyclomine if no response to Xifaxan - Follow-up 2-3 weeks after completing antibiotics, earlier as needed - Follow-up with PCP re: dizziness  HPI: Jeffrey Gallagher a 67y.o. male returns in follow-up, worked in after he called earlier this month with concerns for a UC flare presenting with bloating, eructation, and fatigue.   He has a history of left-sided segemtal colitis in 2013 treated with Azulfidine. Resumed Lialda 2.4g daily after a 4-5 year hiatus without significant improvement in symptoms of bloody diarrhea up to 6-7 daily with bloating and gas. Colonoscopy 07/22/20 revealed moderately active colitis from the anal verge to 30 cm.  Symptoms  improved with Lialda 4.8 g daily and Rowasa 4g enemas PR BID x 2 weeks.  Returns in follow-up. He continues to have bloating and eructationsince his appendectomy in July 2023. He felt better after antibiotics for Yersinia enterocolitica. He feels like his bowel habits are now back to normal.  He is having two formed bowel movements daily. No blood or mucous in the stool. Appetite is good. Weight is stable. He stopped the iron supplements as recommended by PCP when the iron level returned normal. Primarily complaint today is dizziness.  He has a chest CT planned for this spring/summer.    Labs 12/21: fecal calprotectin 508, negative GI pathogen panel, ESR 21, CRP 1, Hemoglobin 12.3, MCV 92.7, RDW 13.3, platelets 315 Labs 08/21/20: WBC 7, hgb 12.2, MCV 90, RDW 13.7, platelets 321, ferritin 34.5, iron 53 Labs 03/12/22: iron 139, ferritin 30, percent saturation 44 Labs 04/21/22: normal liver enzymes, normal CBC, normal TSH Labs 04/30/22: fecal calprotectin 36 Labs 07/30/22: iron 175  CT abd/pelvis 05/25/22: 747mpulmonary nodule, atherosclerosis  Endoscopic history: Colonoscopy 02/02/2005 for hematochezia: 10 mm hyperplastic rectal polyp Colonoscopy 02/11/2009: colitis from 25 to 45 cm with rectal sparing. Biopsies most consistent with UC. Rectal biopsies not obtained.  Started on sulfasalazine. Colonoscopy 04/29/2012: Internal hemorrhoids.  Right sided colon biopsies were normal.  Left sided colon biopsies from 30 to 50 cm showed mild chronic active inflammation with mild cryptitis but no granulomata.  Rectal biopsies were normal. Colonoscopy 07/22/20: moderately active colitis from the anal verge to 30 cm.  No known family history of colon cancer or polyps. No family history of uterine/endometrial cancer, pancreatic cancer or gastric/stomach cancer.   Past Medical History:  Diagnosis Date   Anemia    Hyperlipidemia    Internal hemorrhoids    Ulcerative colitis, left sided ([place])    Vitamin D  deficiency     Past Surgical History:  Procedure Laterality Date   LAPAROSCOPIC APPENDECTOMY N/A 02/06/2022   Procedure: APPENDECTOMY LAPAROSCOPIC;  Surgeon: Kinsinger, Arta Bruce, MD;  Location: [facility];  Service: General;  Laterality: N/A;    Current Outpatient Medications  Medication Sig Dispense Refill   aspirin 81 MG tablet Take 81 mg by mouth daily.     Cholecalciferol (VITAMIN D PO) Take 6,000 Int'l Units by mouth daily.     citalopram (CELEXA) 10 MG tablet Take 1 tablet (10 mg total) by mouth daily. 30 tablet 2   Flaxseed, Linseed, (FLAX SEED OIL) 1000 MG CAPS Takes  1 capsule  Daily  0   fluticasone (FLONASE) 50 MCG/ACT nasal spray 2 sprays each nostril once a day 15.8 mL 2   Multiple Vitamins-Minerals (MULTIVITAMIN PO) Take by mouth. Take 1 tab daily     pantoprazole (PROTONIX) 40 MG tablet TAKE 1 TABLET BY MOUTH DAILY 30 tablet 0   rifaximin (XIFAXAN) 550 MG TABS tablet Take 1 tablet (550 mg total) by mouth 3 (three) times daily. 42 tablet 0   mesalamine (LIALDA) 1.2 g EC tablet Take 4 tablets (4.8 g total) by mouth daily. 360 tablet 3   No current facility-administered medications for this visit.    Allergies as of 08/03/2022   (No Known Allergies)    Family History  Problem Relation Age of Onset   Lung cancer Mother    Coronary artery disease Father    Colon cancer Neg Hx    Esophageal cancer Neg Hx    Stomach cancer Neg Hx    Rectal cancer Neg Hx    Pancreatic cancer Neg Hx      Physical Exam: General:   Alert,  well-nourished, pleasant and cooperative in NAD Head:  Normocephalic and atraumatic. Eyes:  Sclera clear, no icterus.   Conjunctiva pink. Abdomen:  Soft, nontender, nondistended, normal bowel sounds, no rebound or guarding. No hepatosplenomegaly.   Rectal:  Deferred  Msk:  Symmetrical. No boney deformities LAD: No inguinal or umbilical LAD Extremities:  No clubbing or edema. Neurologic:  Alert and  oriented x4;  grossly nonfocal Skin:  Intact  without significant lesions or rashes. Psych:  Alert and cooperative. Normal mood and affect.  I spent 30 minutes, including in depth chart review, independent review of results, communicating results with the patient directly, face-to-face time with the patient, coordinating care, and ordering studies and medications as appropriate, and documentation.    Helane Briceno L. Tarri Glenn, MD, MPH 08/03/2022, 9:43 AM

## 2022-08-03 ENCOUNTER — Ambulatory Visit: Payer: Medicare Other | Admitting: Gastroenterology

## 2022-08-03 ENCOUNTER — Encounter: Payer: Self-pay | Admitting: Gastroenterology

## 2022-08-03 VITALS — BP 142/72 | HR 57 | Ht 69.0 in | Wt 166.0 lb

## 2022-08-03 DIAGNOSIS — K5150 Left sided colitis without complications: Secondary | ICD-10-CM

## 2022-08-03 DIAGNOSIS — R142 Eructation: Secondary | ICD-10-CM

## 2022-08-03 MED ORDER — MESALAMINE 1.2 G PO TBEC: 4.8000 g | DELAYED_RELEASE_TABLET | Freq: Every day | ORAL | 3 refills | Status: DC

## 2022-08-03 MED ORDER — RIFAXIMIN 550 MG PO TABS: 550.0000 mg | ORAL_TABLET | Freq: Three times a day (TID) | ORAL | 0 refills | Status: DC

## 2022-08-03 NOTE — Patient Instructions (Addendum)
It was a pleasure to see you today  We have sent the following medications to your pharmacy for you to pick up at your convenience: Lialda 4.8 g daily.  Xifaxan 500 mg three times daily for 14 days.   I would like to see back in the office 2 weeks after completing antibiotics to see if this helps your belching  Follow up with your primary care doctor given your concerns for lightheadedness.   _______________________________________________________  If your blood pressure at your visit was 140/90 or greater, please contact your primary care physician to follow up on this.  _______________________________________________________  If you are age 66 or older, your body mass index should be between 23-30. Your Body mass index is 24.51 kg/m. If this is out of the aforementioned range listed, please consider follow up with your Primary Care Provider.  If you are age 39 or younger, your body mass index should be between 19-25. Your Body mass index is 24.51 kg/m. If this is out of the aformentioned range listed, please consider follow up with your Primary Care Provider.   ________________________________________________________  The [facility] providers would like to encourage you to use [facility] to communicate with providers for non-urgent requests or questions.  Due to long hold times on the telephone, sending your provider a message by [facility] may be a faster and more efficient way to get a response.  Please allow 48 business hours for a response.  Please remember that this is for non-urgent requests.  _______________________________________________________

## 2022-08-04 DIAGNOSIS — Z08 Encounter for follow-up examination after completed treatment for malignant neoplasm: Secondary | ICD-10-CM | POA: Diagnosis not present

## 2022-08-04 DIAGNOSIS — D485 Neoplasm of uncertain behavior of skin: Secondary | ICD-10-CM | POA: Diagnosis not present

## 2022-08-04 DIAGNOSIS — L814 Other melanin hyperpigmentation: Secondary | ICD-10-CM | POA: Diagnosis not present

## 2022-08-04 DIAGNOSIS — C44319 Basal cell carcinoma of skin of other parts of face: Secondary | ICD-10-CM | POA: Diagnosis not present

## 2022-08-04 DIAGNOSIS — D225 Melanocytic nevi of trunk: Secondary | ICD-10-CM | POA: Diagnosis not present

## 2022-08-04 DIAGNOSIS — C44212 Basal cell carcinoma of skin of right ear and external auricular canal: Secondary | ICD-10-CM | POA: Diagnosis not present

## 2022-08-04 DIAGNOSIS — L821 Other seborrheic keratosis: Secondary | ICD-10-CM | POA: Diagnosis not present

## 2022-09-15 ENCOUNTER — Encounter: Payer: Self-pay | Admitting: Gastroenterology

## 2022-09-15 ENCOUNTER — Ambulatory Visit: Payer: Medicare Other | Admitting: Gastroenterology

## 2022-09-15 ENCOUNTER — Other Ambulatory Visit (INDEPENDENT_AMBULATORY_CARE_PROVIDER_SITE_OTHER): Payer: Medicare Other

## 2022-09-15 VITALS — BP 106/70 | HR 56 | Ht 67.75 in | Wt 166.5 lb

## 2022-09-15 DIAGNOSIS — D509 Iron deficiency anemia, unspecified: Secondary | ICD-10-CM | POA: Diagnosis not present

## 2022-09-15 DIAGNOSIS — R109 Unspecified abdominal pain: Secondary | ICD-10-CM

## 2022-09-15 DIAGNOSIS — R140 Abdominal distension (gaseous): Secondary | ICD-10-CM

## 2022-09-15 DIAGNOSIS — R142 Eructation: Secondary | ICD-10-CM | POA: Diagnosis not present

## 2022-09-15 LAB — CBC WITH DIFFERENTIAL/PLATELET
Basophils Absolute: 0 10*3/uL (ref 0.0–0.1)
Basophils Relative: 0.5 % (ref 0.0–3.0)
Eosinophils Absolute: 0.1 10*3/uL (ref 0.0–0.7)
Eosinophils Relative: 1.4 % (ref 0.0–5.0)
HCT: 41 % (ref 39.0–52.0)
Hemoglobin: 13.8 g/dL (ref 13.0–17.0)
Lymphocytes Relative: 32.2 % (ref 12.0–46.0)
Lymphs Abs: 1.6 10*3/uL (ref 0.7–4.0)
MCHC: 33.6 g/dL (ref 30.0–36.0)
MCV: 95.7 fl (ref 78.0–100.0)
Monocytes Absolute: 0.5 10*3/uL (ref 0.1–1.0)
Monocytes Relative: 9.4 % (ref 3.0–12.0)
Neutro Abs: 2.7 10*3/uL (ref 1.4–7.7)
Neutrophils Relative %: 56.5 % (ref 43.0–77.0)
Platelets: 294 10*3/uL (ref 150.0–400.0)
RBC: 4.29 Mil/uL (ref 4.22–5.81)
RDW: 13.4 % (ref 11.5–15.5)
WBC: 4.8 10*3/uL (ref 4.0–10.5)

## 2022-09-15 LAB — IBC + FERRITIN
Ferritin: 47.8 ng/mL (ref 22.0–322.0)
Iron: 103 ug/dL (ref 42–165)
Saturation Ratios: 33.6 % (ref 20.0–50.0)
TIBC: 306.6 ug/dL (ref 250.0–450.0)
Transferrin: 219 mg/dL (ref 212.0–360.0)

## 2022-09-15 MED ORDER — PANTOPRAZOLE SODIUM 40 MG PO TBEC: 40.0000 mg | DELAYED_RELEASE_TABLET | Freq: Every day | ORAL | 3 refills | Status: DC

## 2022-09-15 MED ORDER — OMEPRAZOLE 40 MG PO CPDR: 40.0000 mg | DELAYED_RELEASE_CAPSULE | Freq: Every day | ORAL | 3 refills | Status: DC

## 2022-09-15 NOTE — Patient Instructions (Signed)
Your provider has requested that you go to the basement level for lab work before leaving today. Press "B" on the elevator. The lab is located at the first door on the left as you exit the elevator.  We have sent the following medications to your pharmacy for you to pick up at your convenience: Pantoprazole  You have been scheduled for an endoscopy. Please follow written instructions given to you at your visit today. If you use inhalers (even only as needed), please bring them with you on the day of your procedure.  _______________________________________________________  If your blood pressure at your visit was 140/90 or greater, please contact your primary care physician to follow up on this.  _______________________________________________________  If you are age 63 or older, your body mass index should be between 23-30. Your Body mass index is 25.5 kg/m. If this is out of the aforementioned range listed, please consider follow up with your Primary Care Provider.  If you are age 50 or younger, your body mass index should be between 19-25. Your Body mass index is 25.5 kg/m. If this is out of the aformentioned range listed, please consider follow up with your Primary Care Provider.   ________________________________________________________  The [facility] providers would like to encourage you to use [facility] to communicate with providers for non-urgent requests or questions.  Due to long hold times on the telephone, sending your provider a message by [facility] may be a faster and more efficient way to get a response.  Please allow 48 business hours for a response.  Please remember that this is for non-urgent requests.  _______________________________________________________

## 2022-09-15 NOTE — Addendum Note (Signed)
Addended by: Horris Latino on: 09/15/2022 03:12 PM   Modules accepted: Orders

## 2022-09-15 NOTE — Progress Notes (Signed)
Jeffrey Gallagher   Referring Provider: Unk Pinto, MD Primary Care Physician:  Unk Pinto, MD  Chief complaint:  Belching, light-headedness   IMPRESSION:  Abdominal pain, eructation and bloating - ?SIBO or post-infectious IBS    - symptoms developed after appendectomy    - some improvement while on antibiotics for Yersinia    - some improvement with empiric Xifaxan x 2 weeks    - ? 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    - initially diagnosed 2013    - symptoms now improved on Lialda 4.8g daily    - fecal calprotectin 508 prior to increasing daily dose    - fecal calprotectin 30 08/23/20 after resuming treatment    - fecal calprotectin 36 04/30/22 25m pulmonary nodule on CT 11/23    - Follow-up CT planned this spring/summer Iron deficiency without anemia    - PCP stopped iron supplements    - although his iron levels are normal, his ferritin level has been consistently <40    - will defer decision to PCP but consider reinitiating oral iron supplements every other day History of hyperplastic rectal polyp 2006 Acute appendicitis 01/2022 No known family history of colon cancer or polyps   PLAN: - Continue Lialda 4.8 g daily - EGD  to further evaluation his abdominal pain and bloating - CBC, iron, ferritin - Continue pantoprazole 40 mg daily (refilled today) - Consider trial of dicyclomine in the future - Consider repeating Xifaxan 550 mg TID x 14 days, may substitute with doxycyline 100 mg BID, if symptoms return - Follow-up 6 months, earlier if needed  HPI: Jeffrey ELLISTONis a 67y.o. male returns in follow-up, worked in after he called earlier this month with concerns for a UC flare presenting with bloating, eructation, and fatigue.   He has a history of left-sided segemtal colitis in 2013 treated with Azulfidine. Resumed Lialda 2.4g daily after a 4-5 year hiatus  without significant improvement in symptoms of bloody diarrhea up to 6-7 daily with bloating and gas. Colonoscopy 07/22/20 revealed moderately active colitis from the anal verge to 30 cm.  Symptoms improved with Lialda 4.8 g daily and Rowasa 4g enemas PR BID x 2 weeks.  He has recently been under evaluation for bloating and eructation since his appendectomy in July 2023. He felt better after antibiotics for Yersinia enterocolitica.  Returns today in follow-up. He is overall feeling better. He found the 2 weeks of Xifaxan did provide some improvement symptoms and more formed stools.  Bowel habits have normalized with two formed bowel movements daily.  He also attributes some improvement Celexa.  He now feels like his eructation is due to anxiety. 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 Using his wife's Xanax has relieved his symptoms. His energy levels have improved since starting iron.   Labs 12/21: fecal calprotectin 508, negative GI pathogen panel, ESR 21, CRP 1, Hemoglobin 12.3, MCV 92.7, RDW 13.3, platelets 315 Labs 08/21/20: WBC 7, hgb 12.2, MCV 90, RDW 13.7, platelets 321, ferritin 34.5, iron 53 Labs 03/12/22: iron 139, ferritin 30, percent saturation 44 Labs 04/21/22: normal liver enzymes, normal CBC, normal TSH Labs 04/30/22: fecal calprotectin 36 Labs 07/30/22: iron 175  CT abd/pelvis 05/25/22: 776mpulmonary nodule, atherosclerosis  Endoscopic history: Colonoscopy 02/02/2005 for hematochezia: 10 mm hyperplastic rectal polyp Colonoscopy 02/11/2009: colitis from 25 to 45 cm with rectal sparing.  Biopsies most consistent with UC. Rectal biopsies not obtained.  Started on sulfasalazine. Colonoscopy 04/29/2012: Internal hemorrhoids.  Right sided colon biopsies were normal.  Left sided colon biopsies from 30 to 50 cm showed mild chronic active inflammation with mild cryptitis but no granulomata.  Rectal biopsies were normal. Colonoscopy 07/22/20:  moderately active colitis from the anal verge to 30 cm.   No known family history of colon cancer or polyps. No family history of uterine/endometrial cancer, pancreatic cancer or gastric/stomach cancer.   Past Medical History:  Diagnosis Date   Anemia    Hyperlipidemia    Internal hemorrhoids    Ulcerative colitis, left sided ([place])    Vitamin D deficiency     Past Surgical History:  Procedure Laterality Date   LAPAROSCOPIC APPENDECTOMY N/A 02/06/2022   Procedure: APPENDECTOMY LAPAROSCOPIC;  Surgeon: Kinsinger, Arta Bruce, MD;  Location: [facility];  Service: General;  Laterality: N/A;    Current Outpatient Medications  Medication Sig Dispense Refill   aspirin 81 MG tablet Take 81 mg by mouth daily.     Cholecalciferol (VITAMIN D PO) Take 6,000 Int'l Units by mouth daily.     citalopram (CELEXA) 10 MG tablet Take 1 tablet (10 mg total) by mouth daily. 30 tablet 2   Flaxseed, Linseed, (FLAX SEED OIL) 1000 MG CAPS Takes  1 capsule  Daily  0   fluticasone (FLONASE) 50 MCG/ACT nasal spray 2 sprays each nostril once a day 15.8 mL 2   mesalamine (LIALDA) 1.2 g EC tablet Take 4 tablets (4.8 g total) by mouth daily. 360 tablet 3   Multiple Vitamins-Minerals (MULTIVITAMIN PO) Take by mouth. Take 1 tab daily     pantoprazole (PROTONIX) 40 MG tablet TAKE 1 TABLET BY MOUTH DAILY 30 tablet 0   rifaximin (XIFAXAN) 550 MG TABS tablet Take 1 tablet (550 mg total) by mouth 3 (three) times daily. 42 tablet 0   No current facility-administered medications for this visit.    Allergies as of 09/15/2022   (No Known Allergies)    Family History  Problem Relation Age of Onset   Lung cancer Mother    Coronary artery disease Father    Colon cancer Neg Hx    Esophageal cancer Neg Hx    Stomach cancer Neg Hx    Rectal cancer Neg Hx    Pancreatic cancer Neg Hx      Physical Exam: Gen: Awake, alert, and oriented, and well communicative. HEENT: EOMI, non-icteric sclera, NCAT, MMM  Neck: Normal  movement of head and neck  Pulm: No labored breathing, speaking in full sentences without conversational dyspnea  Derm: No apparent lesions or bruising in visible field  MS: Moves all visible extremities without noticeable abnormality  Psych: Pleasant, cooperative, normal speech, thought processing seemingly intact      I spent 30 minutes, including in depth chart review, independent review of results, communicating results with the patient directly, face-to-face time with the patient, coordinating care, and ordering studies and medications as appropriate, and documentation.    Keondrick Dilks L. Tarri Glenn, MD, MPH 09/15/2022, 8:57 AM

## 2022-09-18 ENCOUNTER — Encounter: Payer: Self-pay | Admitting: Gastroenterology

## 2022-09-18 NOTE — Telephone Encounter (Signed)
Dr Tarri Glenn do you want this pt to continue iron? See his message below

## 2022-09-30 ENCOUNTER — Ambulatory Visit (AMBULATORY_SURGERY_CENTER): Payer: Medicare Other | Admitting: Gastroenterology

## 2022-09-30 ENCOUNTER — Encounter: Payer: Self-pay | Admitting: Gastroenterology

## 2022-09-30 VITALS — BP 120/74 | HR 49 | Temp 98.6°F | Resp 10 | Ht 67.5 in | Wt 166.0 lb

## 2022-09-30 DIAGNOSIS — K2970 Gastritis, unspecified, without bleeding: Secondary | ICD-10-CM

## 2022-09-30 DIAGNOSIS — K2950 Unspecified chronic gastritis without bleeding: Secondary | ICD-10-CM | POA: Diagnosis not present

## 2022-09-30 DIAGNOSIS — K31A0 Gastric intestinal metaplasia, unspecified: Secondary | ICD-10-CM | POA: Diagnosis not present

## 2022-09-30 DIAGNOSIS — D509 Iron deficiency anemia, unspecified: Secondary | ICD-10-CM | POA: Diagnosis not present

## 2022-09-30 MED ORDER — SODIUM CHLORIDE 0.9 % IV SOLN: 500.0000 mL | Freq: Once | INTRAVENOUS | Status: DC

## 2022-09-30 NOTE — Patient Instructions (Addendum)
YOU HAD AN ENDOSCOPIC PROCEDURE TODAY AT [facility]:   Refer to the procedure report that was given to you for any specific questions about what was found during the examination.  If the procedure report does not answer your questions, please call your gastroenterologist to clarify.  If you requested that your care partner not be given the details of your procedure findings, then the procedure report has been included in a sealed envelope for you to review at your convenience later. - Resume previous diet. - Continue present medications. - Increase omeprazole to 40 mg BID. - Await pathology results. - No aspirin, ibuprofen, naproxen, or other non-steroidal anti-inflammatory drugs.  YOU SHOULD EXPECT: Some feelings of bloating in the abdomen. Passage of more gas than usual.  Walking can help get rid of the air that was put into your GI tract during the procedure and reduce the bloating. If you had a lower endoscopy (such as a colonoscopy or flexible sigmoidoscopy) you may notice spotting of blood in your stool or on the toilet paper. If you underwent a bowel prep for your procedure, you may not have a normal bowel movement for a few days.  Please Note:  You might notice some irritation and congestion in your nose or some drainage.  This is from the oxygen used during your procedure.  There is no need for concern and it should clear up in a day or so.  SYMPTOMS TO REPORT IMMEDIATELY:  Following upper endoscopy (EGD)  Vomiting of blood or coffee ground material  New chest pain or pain under the shoulder blades  Painful or persistently difficult swallowing  New shortness of breath  Fever of 100F or higher  Black, tarry-looking stools  For urgent or emergent issues, a gastroenterologist can be reached at any hour by calling [phone]. Do not use MyChart messaging for urgent concerns.    DIET:  We do recommend a small meal at first, but then you may proceed to your regular  diet.  Drink plenty of fluids but you should avoid alcoholic beverages for 24 hours.  ACTIVITY:  You should plan to take it easy for the rest of today and you should NOT DRIVE or use heavy machinery until tomorrow (because of the sedation medicines used during the test).    FOLLOW UP: Our staff will call the number listed on your records the next business day following your procedure.  We will call around 7:15- 8:00 am to check on you and address any questions or concerns that you may have regarding the information given to you following your procedure. If we do not reach you, we will leave a message.     If any biopsies were taken you will be contacted by phone or by letter within the next 1-3 weeks.  Please call us at [phone] if you have not heard about the biopsies in 3 weeks.    SIGNATURES/CONFIDENTIALITY: You and/or your care partner have signed paperwork which will be entered into your electronic medical record.  These signatures attest to the fact that that the information above on your After Visit Summary has been reviewed and is understood.  Full responsibility of the confidentiality of this discharge information lies with you and/or your care-partner.

## 2022-09-30 NOTE — Progress Notes (Signed)
Called to room to assist during endoscopic procedure.  Patient ID and intended procedure confirmed with present staff. Received instructions for my participation in the procedure from the performing physician.  

## 2022-09-30 NOTE — Op Note (Addendum)
[place] Patient Name: Jeffrey Gallagher Procedure Date: 09/30/2022 12:25 PM MRN: [id number] Endoscopist: Thornton Park MD, MD, [id number] Age: 67 Referring MD:  Date of Birth: 09/02/1955 Gender: Male Account #: [id number] Procedure:                Upper GI endoscopy Indications:              Abdominal pain, Abdominal bloating, Eructation Medicines:                Monitored Anesthesia Care Procedure:                Pre-Anesthesia Assessment:                           - Prior to the procedure, a History and Physical                            was performed, and patient medications and                            allergies were reviewed. The patient's tolerance of                            previous anesthesia was also reviewed. The risks                            and benefits of the procedure and the sedation                            options and risks were discussed with the patient.                            All questions were answered, and informed consent                            was obtained. Prior Anticoagulants: The patient has                            taken no anticoagulant or antiplatelet agents. ASA                            Grade Assessment: II - A patient with mild systemic                            disease. After reviewing the risks and benefits,                            the patient was deemed in satisfactory condition to                            undergo the procedure.                           After obtaining informed consent, the endoscope was  passed under direct vision. Throughout the                            procedure, the patient's blood pressure, pulse, and                            oxygen saturations were monitored continuously. The                            Olympus scope [id number] was introduced through the                            mouth, and advanced to the second part of duodenum.                            The  upper GI endoscopy was accomplished without                            difficulty. The patient tolerated the procedure                            well. Scope In: Scope Out: Findings:                 The examined esophagus was normal. Biopsies were                            taken from the distal esophagus with a cold forceps                            for histology. Estimated blood loss was minimal.                           Patchy minimal inflammation characterized by                            erythema, friability and granularity was found in                            the gastric body. Biopsies were taken from the                            antrum, body, and fundus with a cold forceps for                            histology. Estimated blood loss was minimal.                           The examined duodenum was normal except for mild                            focal erythema. Biopsies were taken with a cold  forceps for histology. Estimated blood loss was                            minimal.                           The cardia and gastric fundus were normal on                            retroflexion.                           The exam was otherwise without abnormality. Complications:            No immediate complications. Estimated Blood Loss:     Estimated blood loss was minimal. Impression:               - Normal esophagus. Biopsied.                           - Gastritis. Biopsied.                           - Normal examined duodenum. Biopsied.                           - The examination was otherwise normal. Recommendation:           - Patient has a contact number available for                            emergencies. The signs and symptoms of potential                            delayed complications were discussed with the                            patient. Return to normal activities tomorrow.                            Written discharge instructions  were provided to the                            patient.                           - Resume previous diet.                           - Continue present medications.                           - Increase omeprazole to 40 mg BID.                           - Await pathology results.                           - No  aspirin, ibuprofen, naproxen, or other                            non-steroidal anti-inflammatory drugs. Thornton Park MD, MD 09/30/2022 12:44:26 PM This report has been signed electronically.

## 2022-09-30 NOTE — Progress Notes (Signed)
Indications for EGD: Abdominal pain, eructation, bloating  Please see my 09/15/2022 office note for complete details.  There is been no significant change in history or physical exam since that time.  He remains an appropriate candidate for monitored anesthesia care in the endoscopy center today.

## 2022-09-30 NOTE — Progress Notes (Signed)
VS completed by EC.   Pt's states no medical or surgical changes since previsit or office visit.  

## 2022-10-01 ENCOUNTER — Telehealth: Payer: Self-pay | Admitting: *Deleted

## 2022-10-01 NOTE — Telephone Encounter (Signed)
  Follow up Call-     09/30/2022   10:58 AM 06/10/2022    2:36 PM 07/22/2020    7:35 AM  Call back number  Post procedure Call Back phone  # [phone]  Permission to leave phone message Yes Yes Yes     Patient questions:  Message left to call us if necessary.

## 2022-10-14 DIAGNOSIS — C44212 Basal cell carcinoma of skin of right ear and external auricular canal: Secondary | ICD-10-CM | POA: Diagnosis not present

## 2022-10-21 ENCOUNTER — Encounter: Payer: Self-pay | Admitting: Gastroenterology

## 2022-10-21 ENCOUNTER — Other Ambulatory Visit: Payer: Self-pay | Admitting: Nurse Practitioner

## 2022-10-21 DIAGNOSIS — K080 Exfoliation of teeth due to systemic causes: Secondary | ICD-10-CM | POA: Diagnosis not present

## 2022-10-21 DIAGNOSIS — F419 Anxiety disorder, unspecified: Secondary | ICD-10-CM

## 2022-10-22 DIAGNOSIS — K080 Exfoliation of teeth due to systemic causes: Secondary | ICD-10-CM | POA: Diagnosis not present

## 2022-10-28 DIAGNOSIS — D485 Neoplasm of uncertain behavior of skin: Secondary | ICD-10-CM | POA: Diagnosis not present

## 2022-10-28 DIAGNOSIS — C44319 Basal cell carcinoma of skin of other parts of face: Secondary | ICD-10-CM | POA: Diagnosis not present

## 2022-10-28 DIAGNOSIS — C44311 Basal cell carcinoma of skin of nose: Secondary | ICD-10-CM | POA: Diagnosis not present

## 2022-11-02 ENCOUNTER — Encounter: Payer: Self-pay | Admitting: Internal Medicine

## 2022-11-02 ENCOUNTER — Ambulatory Visit (INDEPENDENT_AMBULATORY_CARE_PROVIDER_SITE_OTHER): Payer: Medicare Other | Admitting: Internal Medicine

## 2022-11-02 VITALS — BP 130/70 | HR 71 | Temp 97.9°F | Resp 16 | Ht 69.0 in | Wt 167.0 lb

## 2022-11-02 DIAGNOSIS — R0989 Other specified symptoms and signs involving the circulatory and respiratory systems: Secondary | ICD-10-CM

## 2022-11-02 DIAGNOSIS — E559 Vitamin D deficiency, unspecified: Secondary | ICD-10-CM

## 2022-11-02 DIAGNOSIS — I700 Atherosclerosis of aorta: Secondary | ICD-10-CM

## 2022-11-02 DIAGNOSIS — Z79899 Other long term (current) drug therapy: Secondary | ICD-10-CM

## 2022-11-02 DIAGNOSIS — R911 Solitary pulmonary nodule: Secondary | ICD-10-CM | POA: Insufficient documentation

## 2022-11-02 DIAGNOSIS — R7309 Other abnormal glucose: Secondary | ICD-10-CM

## 2022-11-02 DIAGNOSIS — E782 Mixed hyperlipidemia: Secondary | ICD-10-CM | POA: Diagnosis not present

## 2022-11-02 DIAGNOSIS — K5150 Left sided colitis without complications: Secondary | ICD-10-CM

## 2022-11-02 NOTE — Patient Instructions (Signed)

## 2022-11-02 NOTE — Progress Notes (Signed)
Future Appointments  Date Time Provider Department  11/02/2022                     ov  3:30 PM [name], MD [facility]  04/22/2023                   cpe  2:00 PM [name], MD [facility]    History of Present Illness:       This very nice 67 y.o. MWM presents for 3 month follow up with HTN, HLD, Pre-Diabetes and Vitamin D Deficiency.  Patient is also followed by Dr Carmelia Roller  and is on Lialda for Ulcerative Colitis.  CT Scan on Nov 13 , 2023 suggested ? RML lung nodule sl enlarged compared to previous & suggested 6 month f/u CT (ordered for after December 14, 2022) . Patient also had recent normal Iron studies & Vit B12 levels done by Dr Orvan Falconer.           In Jan 2023,  patient was evaluated in  the ER with Brain MRI & Cervical CT scans foir numbness of the upper extremities . MRI showed Normal brain, but patient was found to have significant Cx spinal stenosis  and patient Upper Extremity numbness sx's resolved. In July 2023, Patient had a lap Appendectomy by Dr Sheliah Hatch for Perforated Appendix & recovered uneventfully.        Labile HTN has been monitored expectantly since 2007.  Today's BP is at goal - 124/78. Patient has had no complaints of any cardiac type chest pain, palpitations, dyspnea / orthopnea / PND, dizziness, claudication or dependent edema.       Hyperlipidemia has not been controlled with diet in the past.  Last Lipids were not at goal :  Lab Results  Component Value Date   CHOL 187 04/16/2020   HDL 34 (L) 04/16/2020   LDLCALC 116 (H) 04/16/2020   TRIG 243 (H) 04/16/2020   CHOLHDL 5.5 (H) 04/16/2020     Also, the patient has been monitored expectantly for glucose intolerance and has had no symptoms of reactive hypoglycemia, diabetic polys, paresthesias or visual blurring.  Last A1c was at goal :  Lab   Component Value Date   HGBA1C 5.4 01/02/2016                                                          Further, the patient also has history  of Vitamin D Deficiency ("41" /2008) and supplements vitamin D without any suspected side-effects. Last vitamin D was at  goal :   Lab Results  Component Value Date   VD25OH 82 02/01/2017     Current Outpatient Medications on File Prior to Visit  Medication Sig   aspirin 81 MG tablet Take daily.   VITAMIN D  Take 6,000 Int'l Units  daily.   mesalamine (LIALDA) 1.2 g EC tablet Take 4 tablets (4.8 g total) daily.   Multiple Vitamins-Minerals  Take 1 tab daily   Flaxseed oil  1 capsule daily.   Probiotic  Take 1 capsule daily.     No Known Allergies   PMHx:   Past Medical History:  Diagnosis Date   Anemia    Hyperlipidemia    Internal hemorrhoids    Ulcerative colitis, left sided (  HCC)    Vitamin D deficiency      Immunization History  Administered Date(s) Administered   Td 07/13/2001     Past Surgical History:  Procedure Laterality Date   LAPAROSCOPIC APPENDECTOMY N/A 02/06/2022   Procedure: APPENDECTOMY LAPAROSCOPIC;  Surgeon: Kinsinger, De Blanch, MD;  Location: [facility] OR;  Service: General;  Laterality: N/A;   UPPER GASTROINTESTINAL ENDOSCOPY      FHx:    Reviewed / unchanged  SHx:    Reviewed / unchanged   Systems Review:  Constitutional: Denies fever, chills, wt changes, headaches, insomnia, fatigue, night sweats, change in appetite. Eyes: Denies redness, blurred vision, diplopia, discharge, itchy, watery eyes.  ENT: Denies discharge, congestion, post nasal drip, epistaxis, sore throat, earache, hearing loss, dental pain, tinnitus, vertigo, sinus pain, snoring.  CV: Denies chest pain, palpitations, irregular heartbeat, syncope, dyspnea, diaphoresis, orthopnea, PND, claudication or edema. Respiratory: denies cough, dyspnea, DOE, pleurisy, hoarseness, laryngitis, wheezing.  Gastrointestinal: Denies dysphagia, odynophagia, heartburn, reflux, water brash, abdominal pain or cramps, nausea, vomiting, bloating, diarrhea, constipation, hematemesis, melena,  hematochezia  or hemorrhoids. Genitourinary: Denies dysuria, frequency, urgency, nocturia, hesitancy, discharge, hematuria or flank pain. Musculoskeletal: Denies arthralgias, myalgias, stiffness, jt. swelling, pain, limping or strain/sprain.  Skin: Denies pruritus, rash, hives, warts, acne, eczema or change in skin lesion(s). Neuro: No weakness, tremor, incoordination, spasms, paresthesia or pain. Psychiatric: Denies confusion, memory loss or sensory loss. Endo: Denies change in weight, skin or hair change.  Heme/Lymph: No excessive bleeding, bruising or enlarged lymph nodes.  Physical Exam  There were no vitals taken for this visit.  Appears  well nourished  and in no distress.  Eyes: PERRLA, EOMs, conjunctiva no swelling or erythema. Sinuses: No frontal/maxillary tenderness ENT/Mouth: EAC's clear, TM's nl w/o erythema, bulging. Nares clear w/o erythema, swelling, exudates. Oropharynx clear without erythema or exudates. Oral hygiene is good. Tongue normal, non obstructing. Hearing intact.  Neck: Supple. Thyroid not palpable. Car 2+/2+ without bruits, nodes or JVD. Chest: Respirations nl with BS clear & equal w/o rales, rhonchi, wheezing or stridor.  Cor: Heart sounds normal w/ regular rate and rhythm without sig. murmurs, gallops, clicks or rubs. Peripheral pulses normal and equal  without edema.  Abdomen: Soft & bowel sounds normal. Non-tender w/o guarding, rebound, hernias, masses or organomegaly.  Lymphatics: Unremarkable.  Musculoskeletal: Full ROM all peripheral extremities, joint stability, 5/5 strength and normal gait.  Skin: Warm, dry without exposed rashes, lesions or ecchymosis apparent.  Neuro: Cranial nerves intact, reflexes equal bilaterally. Sensory-motor testing grossly intact. Tendon reflexes grossly intact.  Pysch: Alert & oriented x 3.  Insight and judgement nl & appropriate. No ideations.  Assessment and Plan:  1. Labile hypertension  - Continue medication, monitor  blood pressure at home.  - Continue DASH diet.  Reminder to go to the ER if any CP,  SOB, nausea, dizziness, severe HA, changes vision/speech.   - CBC with Differential/Platelet - COMPLETE METABOLIC PANEL WITH GFR - Magnesium - TSH  2. Hyperlipidemia, mixed  - Continue diet/meds, exercise,& lifestyle modifications.  - Continue monitor periodic cholesterol/liver & renal functions    - Lipid panel - TSH  3. Abnormal glucose  - Continue diet, exercise  - Lifestyle modifications.  - Monitor appropriate labs   - Hemoglobin A1c - Insulin, random  4. Vitamin D deficiency  - Continue supplementation   - VITAMIN D 25 Hydroxy  5. Left sided ulcerative (chronic) colitis (HCC)   6. Medication management - CBC with Differential/Platelet - COMPLETE METABOLIC PANEL WITH GFR -  Magnesium - Lipid panel - TSH - Hemoglobin A1c - Insulin, random - VITAMIN D 25 Hydroxy           Discussed  regular exercise, BP monitoring, weight control to achieve/maintain BMI less than 25 and discussed med and SE's. Recommended labs to assess and monitor clinical status with further disposition pending results of labs.  I discussed the assessment and treatment plan with the patient. The patient was provided an opportunity to ask questions and all were answered. The patient agreed with the plan and demonstrated an understanding of the instructions.  I provided over 30 minutes of exam, counseling, chart review and  complex critical decision making.         The patient was advised to call back or seek an in-person evaluation if the symptoms worsen or if the condition fails to improve as anticipated.   Marinus Maw, MD

## 2022-11-03 ENCOUNTER — Other Ambulatory Visit: Payer: Self-pay | Admitting: Internal Medicine

## 2022-11-03 DIAGNOSIS — E782 Mixed hyperlipidemia: Secondary | ICD-10-CM

## 2022-11-03 LAB — COMPLETE METABOLIC PANEL WITH GFR
AG Ratio: 1.9 (calc) (ref 1.0–2.5)
ALT: 16 U/L (ref 9–46)
AST: 15 U/L (ref 10–35)
Albumin: 4.2 g/dL (ref 3.6–5.1)
Alkaline phosphatase (APISO): 82 U/L (ref 35–144)
BUN: 22 mg/dL (ref 7–25)
CO2: 27 mmol/L (ref 20–32)
Calcium: 9.2 mg/dL (ref 8.6–10.3)
Chloride: 105 mmol/L (ref 98–110)
Creat: 0.97 mg/dL (ref 0.70–1.35)
Globulin: 2.2 g/dL (calc) (ref 1.9–3.7)
Glucose, Bld: 101 mg/dL — ABNORMAL HIGH (ref 65–99)
Potassium: 4.2 mmol/L (ref 3.5–5.3)
Sodium: 139 mmol/L (ref 135–146)
Total Bilirubin: 0.5 mg/dL (ref 0.2–1.2)
Total Protein: 6.4 g/dL (ref 6.1–8.1)
eGFR: 86 mL/min/{1.73_m2} (ref >=60)

## 2022-11-03 LAB — CBC WITH DIFFERENTIAL/PLATELET
Absolute Monocytes: 389 cells/uL (ref 200–950)
Basophils Absolute: 27 cells/uL (ref 0–200)
Basophils Relative: 0.4 %
Eosinophils Absolute: 47 cells/uL (ref 15–500)
Eosinophils Relative: 0.7 %
HCT: 39.8 % (ref 38.5–50.0)
Hemoglobin: 13.4 g/dL (ref 13.2–17.1)
Lymphs Abs: 1655 cells/uL (ref 850–3900)
MCH: 31.4 pg (ref 27.0–33.0)
MCHC: 33.7 g/dL (ref 32.0–36.0)
MCV: 93.2 fL (ref 80.0–100.0)
MPV: 10.3 fL (ref 7.5–12.5)
Monocytes Relative: 5.8 %
Neutro Abs: 4583 cells/uL (ref 1500–7800)
Neutrophils Relative %: 68.4 %
Platelets: 330 10*3/uL (ref 140–400)
RBC: 4.27 10*6/uL (ref 4.20–5.80)
RDW: 12 % (ref 11.0–15.0)
Total Lymphocyte: 24.7 %
WBC: 6.7 10*3/uL (ref 3.8–10.8)

## 2022-11-03 LAB — TSH: TSH: 2.8 mIU/L (ref 0.40–4.50)

## 2022-11-03 LAB — MAGNESIUM: Magnesium: 1.8 mg/dL (ref 1.5–2.5)

## 2022-11-03 LAB — VITAMIN D 25 HYDROXY (VIT D DEFICIENCY, FRACTURES): Vit D, 25-Hydroxy: 94 ng/mL (ref 30–100)

## 2022-11-03 LAB — C-REACTIVE PROTEIN: CRP: <3 mg/L (ref <8.0)

## 2022-11-03 LAB — HEMOGLOBIN A1C
Hgb A1c MFr Bld: 5.5 % of total Hgb (ref <5.7)
Mean Plasma Glucose: 111 mg/dL
eAG (mmol/L): 6.2 mmol/L

## 2022-11-03 LAB — LIPID PANEL
Cholesterol: 217 mg/dL — ABNORMAL HIGH (ref <200)
HDL: 39 mg/dL — ABNORMAL LOW (ref >=40)
LDL Cholesterol (Calc): 147 mg/dL (calc) — ABNORMAL HIGH
Non-HDL Cholesterol (Calc): 178 mg/dL (calc) — ABNORMAL HIGH (ref <130)
Total CHOL/HDL Ratio: 5.6 (calc) — ABNORMAL HIGH (ref <5.0)
Triglycerides: 172 mg/dL — ABNORMAL HIGH (ref <150)

## 2022-11-03 LAB — INSULIN, RANDOM: Insulin: 23.4 u[IU]/mL — ABNORMAL HIGH

## 2022-11-03 LAB — SEDIMENTATION RATE: Sed Rate: 2 mm/h (ref 0–20)

## 2022-11-03 MED ORDER — ROSUVASTATIN CALCIUM 20 MG PO TABS
ORAL_TABLET | ORAL | 3 refills | Status: AC
Start: 2022-11-03 — End: ?

## 2022-11-03 NOTE — Progress Notes (Signed)
-   Iron,  Ferritin & Vitamin B12 levels done recently by Dr Orvan Falconer were Normal &   Medicare will not pay to have them repeated so soon.    - CT scan reviewed &                          recommended repeat CT in 6 months from date, So I submitted                                                For Xray to repeat CT Lung scan in 6 months after June 3rd.

## 2022-11-08 ENCOUNTER — Encounter: Payer: Self-pay | Admitting: Gastroenterology

## 2022-11-10 MED ORDER — OMEPRAZOLE 40 MG PO CPDR: 40.0000 mg | DELAYED_RELEASE_CAPSULE | Freq: Two times a day (BID) | ORAL | 3 refills | Status: DC

## 2022-11-12 ENCOUNTER — Other Ambulatory Visit: Payer: Self-pay | Admitting: Gastroenterology

## 2022-11-19 ENCOUNTER — Other Ambulatory Visit: Payer: Self-pay | Admitting: Internal Medicine

## 2022-11-19 DIAGNOSIS — R911 Solitary pulmonary nodule: Secondary | ICD-10-CM

## 2022-12-02 ENCOUNTER — Ambulatory Visit (HOSPITAL_BASED_OUTPATIENT_CLINIC_OR_DEPARTMENT_OTHER): Admission: RE | Admit: 2022-12-02 | Payer: Medicare Other | Source: Ambulatory Visit

## 2022-12-03 ENCOUNTER — Ambulatory Visit
Admission: RE | Admit: 2022-12-03 | Discharge: 2022-12-03 | Disposition: A | Discharge status: Home / Self Care | Payer: Medicare Other | Source: Ambulatory Visit | Attending: Internal Medicine | Admitting: Internal Medicine

## 2022-12-03 DIAGNOSIS — R911 Solitary pulmonary nodule: Secondary | ICD-10-CM | POA: Diagnosis not present

## 2022-12-03 DIAGNOSIS — J439 Emphysema, unspecified: Secondary | ICD-10-CM | POA: Diagnosis not present

## 2022-12-08 ENCOUNTER — Other Ambulatory Visit: Payer: Self-pay | Admitting: Internal Medicine

## 2022-12-08 DIAGNOSIS — R911 Solitary pulmonary nodule: Secondary | ICD-10-CM

## 2022-12-08 NOTE — Progress Notes (Signed)
^<^<^<^<^<^<^<^<^<^<^<^<^<^<^<^<^<^<^<^<^<^<^<^<^<^<^<^<^<^<^<^<^<^<^<^<^ ^>^>^>^>^>^>^>^>^>^>^>>^>^>^>^>^>^>^>^>^>^>^>^>^>^>^>^>^>^>^>^>^>^>^>^>^>  -   Follow up Chest CT scan  suspicious of                 possible increasing size of the tiny nodule in the Right Middle lobe of lung.    So Pulmonary Consultation requested  ^<^<^<^<^<^<^<^<^<^<^<^<^<^<^<^<^<^<^<^<^<^<^<^<^<^<^<^<^<^<^<^<^<^<^<^<^ ^>^>^>^>^>^>^>^>^>^>^>^>^>^>^>^>^>^>^>^>^>^>^>^>^>^>^>^>^>^>^>^>^>^>^>^>^

## 2022-12-15 DIAGNOSIS — K080 Exfoliation of teeth due to systemic causes: Secondary | ICD-10-CM | POA: Diagnosis not present

## 2022-12-22 ENCOUNTER — Telehealth: Payer: Self-pay | Admitting: *Deleted

## 2022-12-22 NOTE — Telephone Encounter (Signed)
-----   Message from [name], RN sent at 06/18/2022 11:36 AM EST ----- Patient needs CT ordered & scheduled (6 month follow up). Refer to imaging note 05/25/22.

## 2022-12-22 NOTE — Telephone Encounter (Signed)
Dr Leonides Schanz (DOD)-  Previous Dr Orvan Falconer patient with recommendations for repeat CT at this time (see 05/25/22 imaging note). I think we were repeating due to pulmonary nodule seen on that 05/25/22 imaging. However, it appears patient had chest CT 12/03/22 ordered by Dr Oneta Rack.   Is there any reason patient would need additional CT at this time or defer to [facility]?

## 2022-12-28 NOTE — Progress Notes (Unsigned)
Synopsis: Referred in June 2024 for pulmonary nodule by [name], MD  Subjective:   PATIENT ID: Jeffrey Gallagher GENDER: male DOB: [birth year], MRN: [id number]  No chief complaint on file.   This is a 67 year old gentleman with past medical history of hyperlipidemia, internal hemorrhoids, ulcerative colitis, vitamin D deficiency.  Mother with a history of lung cancer.  Patient is a never smoker.  Occasional alcohol use.Patient was referred after CT scan of the chest completed in May 2024.  Patient was found to have a right middle lobe nodule measuring 8 x 7 mm.  Findings were suspicious for growing neoplasm as seen on previous CT imaging shows the lesion has slightly enlarged.  Was referred here for next steps.    Past Medical History:  Diagnosis Date   Anemia    Hyperlipidemia    Internal hemorrhoids    Ulcerative colitis, left sided (HCC)    Vitamin D deficiency      Family History  Problem Relation Age of Onset   Lung cancer Mother    Coronary artery disease Father    Colon cancer Neg Hx    Esophageal cancer Neg Hx    Stomach cancer Neg Hx    Rectal cancer Neg Hx    Pancreatic cancer Neg Hx      Past Surgical History:  Procedure Laterality Date   LAPAROSCOPIC APPENDECTOMY N/A 02/06/2022   Procedure: APPENDECTOMY LAPAROSCOPIC;  Surgeon: Kinsinger, De Blanch, MD;  Location: [facility] OR;  Service: General;  Laterality: N/A;   UPPER GASTROINTESTINAL ENDOSCOPY      Social History   Socioeconomic History   Marital status: Married    Spouse name: Not on file   Number of children: 0   Years of education: Not on file   Highest education level: Not on file  Occupational History    Employer: [employer]  Tobacco Use   Smoking status: Former    Types: Cigarettes    Quit date: 04/20/2003    Years since quitting: 19.7   Smokeless tobacco: Former  Building services engineer Use: Never used  Substance and Sexual Activity   Alcohol use: Yes    Alcohol/week: 7.0  standard drinks of alcohol    Types: 7 Cans of beer per week    Comment: 2 drinks a day   Drug use: No   Sexual activity: Not on file  Other Topics Concern   Not on file  Social History Narrative   Not on file   Social Determinants of Health   Financial Resource Strain: Not on file  Food Insecurity: Not on file  Transportation Needs: Not on file  Physical Activity: Not on file  Stress: Not on file  Social Connections: Not on file  Intimate Partner Violence: Not on file     No Known Allergies   Outpatient Medications Prior to Visit  Medication Sig Dispense Refill   aspirin 81 MG tablet Take 81 mg by mouth daily.     citalopram (CELEXA) 10 MG tablet TAKE 1 TABLET BY MOUTH DAILY 30 tablet 2   Flaxseed, Linseed, (FLAX SEED OIL) 1000 MG CAPS Takes  1 capsule  Daily  0   mesalamine (LIALDA) 1.2 g EC tablet Take 4 tablets (4.8 g total) by mouth daily. 360 tablet 3   Multiple Vitamins-Minerals (MULTIVITAMIN PO) Take by mouth. Take 1 tab daily     pantoprazole (PROTONIX) 40 MG tablet TAKE 1 TABLET BY MOUTH TWICE A DAY 180 tablet 2  rosuvastatin (CRESTOR) 20 MG tablet Take  1 tablet  Daily  for Cholesterol 90 tablet 3   No facility-administered medications prior to visit.    ROS   Objective:  Physical Exam   There were no vitals filed for this visit.   on *** LPM *** RA BMI Readings from Last 3 Encounters:  11/02/22 24.66 kg/m  09/30/22 25.62 kg/m  09/15/22 25.50 kg/m   Wt Readings from Last 3 Encounters:  11/02/22 167 lb (75.8 kg)  09/30/22 166 lb (75.3 kg)  09/15/22 166 lb 8 oz (75.5 kg)     CBC    Component Value Date/Time   WBC 6.7 11/02/2022 1537   RBC 4.27 11/02/2022 1537   HGB 13.4 11/02/2022 1537   HCT 39.8 11/02/2022 1537   PLT 330 11/02/2022 1537   MCV 93.2 11/02/2022 1537   MCH 31.4 11/02/2022 1537   MCHC 33.7 11/02/2022 1537   RDW 12.0 11/02/2022 1537   LYMPHSABS 1,655 11/02/2022 1537   MONOABS 0.5 09/15/2022 1020   EOSABS 47 11/02/2022  1537   BASOSABS 27 11/02/2022 1537     Chest Imaging:  May 2024 CT chest: 8 mm right middle lobe nodule slightly changed in size. The patient's images have been independently reviewed by me.    Pulmonary Functions Testing Results:     No data to display          FeNO:   Pathology:   Echocardiogram:   Heart Catheterization:     Assessment & Plan:     ICD-10-CM   1. Right middle lobe pulmonary nodule  R91.1       Discussion: ***   Current Outpatient Medications:    aspirin 81 MG tablet, Take 81 mg by mouth daily., Disp: , Rfl:    citalopram (CELEXA) 10 MG tablet, TAKE 1 TABLET BY MOUTH DAILY, Disp: 30 tablet, Rfl: 2   Flaxseed, Linseed, (FLAX SEED OIL) 1000 MG CAPS, Takes  1 capsule  Daily, Disp: , Rfl: 0   mesalamine (LIALDA) 1.2 g EC tablet, Take 4 tablets (4.8 g total) by mouth daily., Disp: 360 tablet, Rfl: 3   Multiple Vitamins-Minerals (MULTIVITAMIN PO), Take by mouth. Take 1 tab daily, Disp: , Rfl:    pantoprazole (PROTONIX) 40 MG tablet, TAKE 1 TABLET BY MOUTH TWICE A DAY, Disp: 180 tablet, Rfl: 2   rosuvastatin (CRESTOR) 20 MG tablet, Take  1 tablet  Daily  for Cholesterol, Disp: 90 tablet, Rfl: 3  I spent *** minutes dedicated to the care of this patient on the date of this encounter to include pre-visit review of records, face-to-face time with the patient discussing conditions above, post visit ordering of testing, clinical documentation with the electronic health record, making appropriate referrals as documented, and communicating necessary findings to members of the patients care team.   Josephine Igo, DO [facility] 12/28/2022 11:06 AM

## 2022-12-29 ENCOUNTER — Ambulatory Visit: Payer: Medicare Other | Admitting: Pulmonary Disease

## 2022-12-29 ENCOUNTER — Encounter: Payer: Self-pay | Admitting: Pulmonary Disease

## 2022-12-29 VITALS — BP 120/80 | HR 58 | Ht 69.0 in | Wt 168.2 lb

## 2022-12-29 DIAGNOSIS — Z87891 Personal history of nicotine dependence: Secondary | ICD-10-CM | POA: Diagnosis not present

## 2022-12-29 DIAGNOSIS — R911 Solitary pulmonary nodule: Secondary | ICD-10-CM

## 2022-12-29 NOTE — Patient Instructions (Addendum)
Thank you for visiting Dr. Tonia Brooms at [facility]. Today we recommend the following:  Orders Placed This Encounter  Procedures   CT Super D Chest Wo Contrast   Please follow up with [place] in 1 year   Return in about 1 year (around 12/29/2023) for with Kandice Robinsons, NP, or Dr. Tonia Brooms, after CT Chest.    Please do your part to reduce the spread of COVID-19.

## 2023-01-27 DIAGNOSIS — K080 Exfoliation of teeth due to systemic causes: Secondary | ICD-10-CM | POA: Diagnosis not present

## 2023-02-08 NOTE — Progress Notes (Unsigned)
3 MONTH FOLLOW UP Assessment:   Jeffrey Gallagher was seen today for follow-up and medicare wellness.  Diagnoses and all orders for this visit:   Labile hypertension - continue DASH diet, exercise and monitor at home. Call if greater than 130/80.   -     CBC with Differential/Platelet  Aortic Atherosclerosis(HCC) Control BP, weight, cholesterol and blood sugars  Hyperlipidemia, mixed Continue diet and exercise On Rosuvastatin 20 mg but just started 2-3 weeks ago -     COMPLETE METABOLIC PANEL WITH GFR -     Lipid panel  Abnormal glucose Continue diet and exercise  Vitamin D deficiency Currently in range  Left sided ulcerative (chronic) colitis (HCC) Continue Lialda and follow with Dr. Orvan Falconer  Medication management -     Magnesium  Overweight (BMI 25.0-29.9) Long discussion about weight loss, diet, and exercise Recommended diet heavy in fruits and veggies and low in animal meats, cheeses, and dairy products, appropriate calorie intake Follow up at next visit   Lipoma right upper arm Refer to general surgery for evaluation and possible removal- has grown and causes occasional numbness of arm.   Anxiety Practice relaxation, good sleep hygiene, diet and exercise    Over 30 minutes of exam, counseling, chart review, and critical decision making was performed  Future Appointments  Date Time Provider Department Center  06/14/2023  2:00 PM [name], MD [facility] None     Subjective:  Jeffrey Gallagher is a 67 y.o. male who presents for Medicare Annual Wellness Visit and 3 month follow up for labile HTN, hyperlipidemia, abnormal glucose, left sided ulcerative colitis and vitamin D Def.   He is followed by Dr. Orvan Falconer for ulcerative colitis and is on Lialda. Last colonoscopy was 07/22/20. He has persistent stomach growling and bloating. CT scan was negative.    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  The area is  increasing in size and does cause occasional numbness in the right arm  His blood pressure has been controlled at home, he is not currently on BP medication, today their BP is BP: 104/62  BP Readings from Last 3 Encounters:  02/09/23 104/62  12/29/22 120/80  11/02/22 130/70  He does workout. He denies chest pain, shortness of breath, dizziness.   BMI is Body mass index is 25.93 kg/m., he has been working on diet and exercise. Wt Readings from Last 3 Encounters:  02/09/23 171 lb 12.8 oz (77.9 kg)  12/29/22 168 lb 3.2 oz (76.3 kg)  11/02/22 167 lb (75.8 kg)     He is on cholesterol medication, rosuvastatin 20 mg but just restarted 2- 3 weeks ago and denies myalgias. His cholesterol is not at goal. He has been limiting dairy, red meat and eggs.  The cholesterol last visit was:   Lab Results  Component Value Date   CHOL 217 (H) 11/02/2022   HDL 39 (L) 11/02/2022   LDLCALC 147 (H) 11/02/2022   TRIG 172 (H) 11/02/2022   CHOLHDL 5.6 (H) 11/02/2022   He has been working on diet and exercise for abnormal glucose. Last A1C in the office was:  Lab Results  Component Value Date   HGBA1C 5.5 11/02/2022   Last GFR Lab Results  Component Value Date   EGFR 86 11/02/2022      Patient is not on Vitamin D supplement.   Lab Results  Component Value Date   VD25OH 94 11/02/2022  Medication Review:   Current Outpatient Medications (Cardiovascular):    rosuvastatin (CRESTOR) 20 MG tablet, Take  1 tablet  Daily  for Cholesterol   Current Outpatient Medications (Analgesics):    aspirin 81 MG tablet, Take 81 mg by mouth daily.   Current Outpatient Medications (Other):    Flaxseed, Linseed, (FLAX SEED OIL) 1000 MG CAPS, Takes  1 capsule  Daily (Patient taking differently: PRN)   mesalamine (LIALDA) 1.2 g EC tablet, Take 4 tablets (4.8 g total) by mouth daily.   Multiple Vitamins-Minerals (MULTIVITAMIN PO), Take by mouth. Take 1 tab daily   omeprazole (PRILOSEC) 20 MG capsule, Take  20 mg by mouth 2 (two) times daily before a meal.  Allergies: No Known Allergies  Current Problems (verified) has Vitamin D deficiency; Mixed hyperlipidemia; ULCERATIVE COLITIS, LEFT SIDED; Hemorrhoids; Elevated PSA; Elevated BP; Medication management; Acute appendicitis; Aortic atherosclerosis (HCC) by Abd CT scan on 02/03/2022; and Nodule of middle lobe of right lung on their problem list.  Screening Tests Immunization History  Administered Date(s) Administered   Td 07/13/2001   Health Maintenance  Topic Date Due   COVID-19 Vaccine (1) Never done   Hepatitis C Screening  Never done   Zoster Vaccines- Shingrix (1 of 2) Never done   DTaP/Tdap/Td (2 - Tdap) 07/14/2011   Pneumonia Vaccine 70+ Years old (1 of 1 - PCV) Never done   Medicare Annual Wellness (AWV)  12/04/2022   INFLUENZA VACCINE  02/11/2023   Colonoscopy  06/10/2032   HPV VACCINES  Aged Out     Names of Other Physician/Practitioners you currently use: 1. [facility] here for primary care 2. Dr. Nile Riggs 07/2021, eye doctor, last visit  3. Dr Ladona Ridgel 10/2021, dentist, last visit  Patient Care Team: [name], MD as PCP - General (Internal Medicine) Janalyn Harder, MD (Inactive) as Consulting Physician (Dermatology)  Surgical: He  has a past surgical history that includes laparoscopic appendectomy (N/A, 02/06/2022) and Upper gastrointestinal endoscopy. Family His family history includes Coronary artery disease in his father; Lung cancer in his mother. Social history  He reports that he quit smoking about 19 years ago. His smoking use included cigarettes. He has quit using smokeless tobacco. He reports current alcohol use of about 7.0 standard drinks of alcohol per week. He reports that he does not use drugs.     Objective:   Today's Vitals   02/09/23 1556  BP: 104/62  Pulse: 66  Temp: 97.9 F (36.6 C)  SpO2: 97%  Weight: 171 lb 12.8 oz (77.9 kg)  Height: 5' 8.25"  (1.734 m)     Body mass index is 25.93 kg/m.  General appearance: alert, no distress, WD/WN, male HEENT: normocephalic, sclerae anicteric, TMs pearly, small amount of fluid left TM, nares palet, no discharge or erythema, pharynx normal Oral cavity: MMM, no lesions Neck: supple, no lymphadenopathy, no thyromegaly, no masses Heart: RRR, normal S1, S2, no murmurs Lungs: CTA bilaterally, no wheezes, rhonchi, or rales Abdomen: +bs, soft, non tender, non distended, no masses, no hepatomegaly, no splenomegaly Musculoskeletal: nontender, no swelling, no obvious deformity Extremities: no edema, no cyanosis, no clubbing. Right medial elbow golf sized lipoma/cyst, non tender.  Pulses: 2+ symmetric, upper and lower extremities, normal cap refill Neurological: alert, oriented x 3, CN2-12 intact, strength normal upper extremities and lower extremities, sensation normal throughout, DTRs 2+ throughout, no cerebellar signs, gait normal Psychiatric: normal affect, behavior normal, pleasant        Raynelle Dick, NP  02/09/2023   

## 2023-02-09 ENCOUNTER — Encounter: Payer: Self-pay | Admitting: Nurse Practitioner

## 2023-02-09 ENCOUNTER — Ambulatory Visit (INDEPENDENT_AMBULATORY_CARE_PROVIDER_SITE_OTHER): Payer: Medicare Other | Admitting: Nurse Practitioner

## 2023-02-09 VITALS — BP 104/62 | HR 66 | Temp 97.9°F | Ht 68.25 in | Wt 171.8 lb

## 2023-02-09 DIAGNOSIS — R7309 Other abnormal glucose: Secondary | ICD-10-CM

## 2023-02-09 DIAGNOSIS — K5150 Left sided colitis without complications: Secondary | ICD-10-CM

## 2023-02-09 DIAGNOSIS — Z79899 Other long term (current) drug therapy: Secondary | ICD-10-CM | POA: Diagnosis not present

## 2023-02-09 DIAGNOSIS — R0989 Other specified symptoms and signs involving the circulatory and respiratory systems: Secondary | ICD-10-CM

## 2023-02-09 DIAGNOSIS — F419 Anxiety disorder, unspecified: Secondary | ICD-10-CM

## 2023-02-09 DIAGNOSIS — E559 Vitamin D deficiency, unspecified: Secondary | ICD-10-CM

## 2023-02-09 DIAGNOSIS — I700 Atherosclerosis of aorta: Secondary | ICD-10-CM | POA: Diagnosis not present

## 2023-02-09 DIAGNOSIS — E663 Overweight: Secondary | ICD-10-CM

## 2023-02-09 DIAGNOSIS — E782 Mixed hyperlipidemia: Secondary | ICD-10-CM

## 2023-02-09 DIAGNOSIS — D1721 Benign lipomatous neoplasm of skin and subcutaneous tissue of right arm: Secondary | ICD-10-CM

## 2023-02-09 LAB — CBC WITH DIFFERENTIAL/PLATELET
Absolute Monocytes: 570 cells/uL (ref 200–950)
Basophils Absolute: 30 cells/uL (ref 0–200)
Basophils Relative: 0.5 %
Eosinophils Absolute: 108 cells/uL (ref 15–500)
Eosinophils Relative: 1.8 %
HCT: 39 % (ref 38.5–50.0)
Hemoglobin: 13.2 g/dL (ref 13.2–17.1)
Lymphs Abs: 1764 cells/uL (ref 850–3900)
MCH: 31.7 pg (ref 27.0–33.0)
MCHC: 33.8 g/dL (ref 32.0–36.0)
MCV: 93.5 fL (ref 80.0–100.0)
MPV: 10.2 fL (ref 7.5–12.5)
Monocytes Relative: 9.5 %
Neutro Abs: 3528 cells/uL (ref 1500–7800)
Neutrophils Relative %: 58.8 %
Platelets: 325 10*3/uL (ref 140–400)
RBC: 4.17 10*6/uL — ABNORMAL LOW (ref 4.20–5.80)
RDW: 12.4 % (ref 11.0–15.0)
Total Lymphocyte: 29.4 %
WBC: 6 10*3/uL (ref 3.8–10.8)

## 2023-02-09 NOTE — Patient Instructions (Signed)
Lipoma  A lipoma is a noncancerous (benign) tumor that is made up of fat cells. This is a very common type of soft-tissue growth. Lipomas are usually found under the skin (subcutaneous). They may occur in any tissue of the body that contains fat. Common areas for lipomas to appear include the back, arms, shoulders, buttocks, and thighs. Lipomas grow slowly, and they are usually painless. Most lipomas do not cause problems and do not require treatment. What are the causes? The cause of this condition is not known. What increases the risk? You are more likely to develop this condition if: You are 40-60 years old. You have a family history of lipomas. What are the signs or symptoms? A lipoma usually appears as a small, round bump under the skin. In most cases, the lump will: Feel soft or rubbery. Not cause pain or other symptoms. However, if a lipoma is located in an area where it pushes on nerves, it can become painful or cause other symptoms. How is this diagnosed? A lipoma can usually be diagnosed with a physical exam. You may also have tests to confirm the diagnosis and to rule out other conditions. Tests may include: Imaging tests, such as a CT scan or an MRI. Removal of a tissue sample to be looked at under a microscope (biopsy). How is this treated? Treatment for this condition depends on the size of the lipoma and whether it is causing any symptoms. For small lipomas that are not causing problems, no treatment is needed. If a lipoma is bigger or it causes problems, surgery may be done to remove the lipoma. Lipomas can also be removed to improve appearance. Most often, the procedure is done after applying a medicine that numbs the area (local anesthetic). Liposuction may be done to reduce the size of the lipoma before it is removed through surgery, or it may be done to remove the lipoma. Lipomas are removed with this method to limit incision size and scarring. A liposuction tube is  inserted through a small incision into the lipoma, and the contents of the lipoma are removed through the tube with suction. Follow these instructions at home: Watch your lipoma for any changes. Keep all follow-up visits. This is important. Where to find more information OrthoInfo: orthoinfo.aaos.org Contact a health care provider if: Your lipoma becomes larger or hard. Your lipoma becomes painful, red, or increasingly swollen. These could be signs of infection or a more serious condition. Get help right away if: You develop tingling or numbness in an area near the lipoma. This could indicate that the lipoma is causing nerve damage. Summary A lipoma is a noncancerous tumor that is made up of fat cells. Most lipomas do not cause problems and do not require treatment. If a lipoma is bigger or it causes problems, surgery may be done to remove the lipoma. Contact a health care provider if your lipoma becomes larger or hard, or if it becomes painful, red, or increasingly swollen. These could be signs of infection or a more serious condition. This information is not intended to replace advice given to you by your health care provider. Make sure you discuss any questions you have with your health care provider. Document Revised: 07/18/2021 Document Reviewed: 07/18/2021 Elsevier Patient Education  2024 Elsevier Inc. 

## 2023-03-08 ENCOUNTER — Ambulatory Visit: Payer: Self-pay | Admitting: Surgery

## 2023-03-08 DIAGNOSIS — D1721 Benign lipomatous neoplasm of skin and subcutaneous tissue of right arm: Secondary | ICD-10-CM | POA: Diagnosis not present

## 2023-03-08 NOTE — H&P (Signed)
Subjective    Chief Complaint: Lipoma       History of Present Illness: Jeffrey Gallagher is a 67 y.o. male who is seen today as an office consultation at the request of Dr. Aundria Rud for evaluation of Lipoma .     This is a 67 year old male who presents with a 30-year history of a slowly enlarging mass on the medial part of his right elbow.  Recently this has become large enough that is causing intermittent numbness shooting down towards his hand.  He was examined by his PCP who felt that this likely represented a lipoma causing some pressure.  He is referred to [place] to discuss excision.   The patient has a history of ulcerative colitis. Review of Systems: A complete review of systems was obtained from the patient.  I have reviewed this information and discussed as appropriate with the patient.  See HPI as well for other ROS.   Review of Systems  Constitutional: Negative.   HENT: Negative.    Eyes: Negative.   Respiratory: Negative.    Cardiovascular: Negative.   Gastrointestinal:  Positive for abdominal pain.  Genitourinary: Negative.   Musculoskeletal: Negative.   Skin: Negative.   Neurological: Negative.   Endo/Heme/Allergies: Negative.   Psychiatric/Behavioral: Negative.          Medical History: Past Medical History  History reviewed. No pertinent past medical history.     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        Family History       Family History  Problem Relation Age of Onset   High blood pressure (Hypertension) Sister          Tobacco Use History  Social History        Tobacco Use  Smoking Status Former   Types: Cigarettes  Smokeless Tobacco Not on file        Social History  Social History         Socioeconomic History   Marital status: Married  Tobacco Use   Smoking status: Former      Types: Cigarettes  Substance and Sexual Activity   Alcohol use: Yes   Drug use: Never        Objective:          Vitals:    03/08/23 1029 03/08/23 1030  BP: 120/68    Pulse: 70    Temp: 37.2 C (98.9 F)    SpO2: 97%    Weight: 76.7 kg (169 lb 3.2 oz)    Height: 175.3 cm (5\' 9" )    PainSc:   0-No pain    Body  mass index is 24.99 kg/m.   Physical Exam    Constitutional:  WDWN in NAD, conversant, no obvious deformities; lying in bed comfortably Eyes:  Pupils equal, round; sclera anicteric; moist conjunctiva; no lid lag HENT:  Oral mucosa moist; good dentition  Neck:  No masses palpated, trachea midline; no thyromegaly Lungs:  CTA bilaterally; normal respiratory effort CV:  Regular rate and rhythm; no murmurs; extremities well-perfused with no edema Abd:  +bowel sounds, soft, non-tender, no palpable organomegaly; no palpable hernias Musc:  Normal gait; no apparent clubbing or cyanosis in extremities Lymphatic:  No palpable cervical or axillary lymphadenopathy Skin:  Warm, dry; no sign of jaundice Right medial elbow shows a protruding 3 cm subcutaneous mass.  When the patient's muscles are relaxed, this mass is quite soft.  There are 2 subcutaneous veins that coursed over this area.  The mass seems to be fairly well-demarcated.  It does not appear to be fixed to the underlying tissue.  This seems to be outside the joint. 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  Potential risks, benefits, alternative treatments, and expected outcomes have been explained.  All of the patient's questions at this time have been answered.  The likelihood of reaching the patient's treatment goal is good.  The patient understands the proposed surgical procedure and wishes to proceed.         Otis Burress Delbert Harness, MD  03/08/2023 10:39 AM

## 2023-03-17 ENCOUNTER — Encounter: Payer: Self-pay | Admitting: Nurse Practitioner

## 2023-04-01 ENCOUNTER — Other Ambulatory Visit: Payer: Self-pay

## 2023-04-01 ENCOUNTER — Encounter: Payer: Self-pay | Admitting: Nurse Practitioner

## 2023-04-01 ENCOUNTER — Ambulatory Visit (INDEPENDENT_AMBULATORY_CARE_PROVIDER_SITE_OTHER): Payer: Medicare Other | Admitting: Nurse Practitioner

## 2023-04-01 VITALS — BP 124/70 | HR 76 | Temp 97.9°F | Resp 16 | Ht 68.25 in | Wt 169.6 lb

## 2023-04-01 DIAGNOSIS — J0110 Acute frontal sinusitis, unspecified: Secondary | ICD-10-CM | POA: Diagnosis not present

## 2023-04-01 DIAGNOSIS — G4483 Primary cough headache: Secondary | ICD-10-CM | POA: Diagnosis not present

## 2023-04-01 DIAGNOSIS — J439 Emphysema, unspecified: Secondary | ICD-10-CM

## 2023-04-01 DIAGNOSIS — Z1152 Encounter for screening for COVID-19: Secondary | ICD-10-CM

## 2023-04-01 DIAGNOSIS — R911 Solitary pulmonary nodule: Secondary | ICD-10-CM | POA: Diagnosis not present

## 2023-04-01 DIAGNOSIS — Z720 Tobacco use: Secondary | ICD-10-CM

## 2023-04-01 LAB — POCT INFLUENZA A/B
Influenza A, POC: NEGATIVE
Influenza B, POC: NEGATIVE

## 2023-04-01 LAB — POC COVID19 BINAXNOW: SARS Coronavirus 2 Ag: NEGATIVE

## 2023-04-01 MED ORDER — PREDNISONE 10 MG PO TABS: ORAL_TABLET | ORAL | 0 refills | Status: DC

## 2023-04-01 MED ORDER — AZITHROMYCIN 250 MG PO TABS
ORAL_TABLET | ORAL | 1 refills | Status: DC
Start: 2023-04-01 — End: 2023-06-13

## 2023-04-01 NOTE — Patient Instructions (Signed)

## 2023-04-01 NOTE — Progress Notes (Signed)
Assessment and Plan:  Jeffrey Gallagher was seen today for an episodic visit.  Diagnoses and all order for this visit:  Cough headache Negative Covid and Flu  - POC COVID-19 - POCT Influenza A/B  Acute non-recurrent frontal sinusitis Start Z-Pak and steroid taper Zyrtec 10 mg sample provided Stay well hydrate to keep mucus thin and productive  - azithromycin (ZITHROMAX) 250 MG tablet; Take 2 tablets on  Day 1,  followed by 1 tablet  daily for 4 more days    for Sinusitis  /Bronchitis  Dispense: 6 each; Refill: 1 - predniSONE (DELTASONE) 10 MG tablet; 1 tab 3 x day for 2 days, then 1 tab 2 x day for 2 days, then 1 tab 1 x day for 3 days  Dispense: 13 tablet; Refill: 0  Nodule of middle lobe of right lung/Emphysema/Occasional smoker Consider pulmonary referral for further work up options Continue annual chest CT Screening     Continue to monitor for any increase in fever, chills, N/V, diarrhea, changes to bowel habits, blood in stool.  Notify office for further evaluation and treatment, questions or concerns if s/s fail to improve. The risks and benefits of my recommendations, as well as other treatment options were discussed with the patient today. Questions were answered.  Further disposition pending results of labs. Discussed med's effects and SE's.    Over 20 minutes of exam, counseling, chart review, and critical decision making was performed.   Future Appointments  Date Time Provider Department Center  06/14/2023  2:00 PM [name], MD [facility] None    ------------------------------------------------------------------------------------------------------------------   HPI BP 124/70   Pulse 76   Temp 97.9 F (36.6 C)   Resp 16   Ht 5' 8.25" (1.734 m)   Wt 169 lb 9.6 oz (76.9 kg)   SpO2 96%   BMI 25.60 kg/m    Patient complains of symptoms of a URI, possible sinusitis. Symptoms include congestion, nasal congestion, productive cough with  yellow and green  colored sputum, and sinus pressure. Onset of symptoms was 3 weeks ago, and has been unchanged since that time. Treatment to date: antibiotics and decongestants.  Denies fever, chills, N/V. Does reports being exposed to Covid however negative in clinic today along with flu.  Admits to occasionally smoking.  Had lung CT scan 11/2022 that revealed right middle lobe nodule measuring 8 x 7 mm. This was previously only partially included on abdominal CT field of views, however a faint millimetric nodule is visualized on remote prior CT. Margins are micronodular. Findings are suspicious for slow growing neoplasm. Consider pulmonary referral for further workup options, nodule does not meet size criteria for PET characterization. Moderate emphysema.  He has no follow up appointments with pulmonology.   Past Medical History:  Diagnosis Date   Anemia    Hyperlipidemia    Internal hemorrhoids    Ulcerative colitis, left sided (HCC)    Vitamin D deficiency      No Known Allergies  Current Outpatient Medications on File Prior to Visit  Medication Sig   aspirin 81 MG tablet Take 81 mg by mouth daily.   Flaxseed, Linseed, (FLAX SEED OIL) 1000 MG CAPS Takes  1 capsule  Daily (Patient taking differently: PRN)   mesalamine (LIALDA) 1.2 g EC tablet Take 4 tablets (4.8 g total) by mouth daily.   Multiple Vitamins-Minerals (MULTIVITAMIN PO) Take by mouth. Take 1 tab daily   omeprazole (PRILOSEC) 20 MG capsule Take 20 mg by mouth 2 (two) times daily  before a meal.   rosuvastatin (CRESTOR) 20 MG tablet Take  1 tablet  Daily  for Cholesterol   No current facility-administered medications on file prior to visit.    ROS: all negative except what is noted in the HPI.   Physical Exam:  BP 124/70   Pulse 76   Temp 97.9 F (36.6 C)   Resp 16   Ht 5' 8.25" (1.734 m)   Wt 169 lb 9.6 oz (76.9 kg)   SpO2 96%   BMI 25.60 kg/m   General Appearance: NAD.  Awake, conversant and cooperative. Eyes: PERRLA, EOMs  intact.  Sclera white.  Conjunctiva without erythema. Sinuses: Frontal/maxillary tenderness.  No nasal discharge. Nares patent.  ENT/Mouth: Ext aud canals clear.  Bilateral TMs w/DOL and without erythema or bulging. Hearing intact.  Posterior pharynx without swelling or exudate.  Tonsils without swelling or erythema.  Neck: Supple.  No masses, nodules or thyromegaly. Respiratory: Effort is regular with non-labored breathing. Breath sounds are equal bilaterally without rales, rhonchi, wheezing or stridor.  Cardio: RRR with no MRGs. Brisk peripheral pulses without edema.  Abdomen: Active BS in all four quadrants.  Soft and non-tender without guarding, rebound tenderness, hernias or masses. Lymphatics: Non tender without lymphadenopathy.  Musculoskeletal: Full ROM, 5/5 strength, normal ambulation.  No clubbing or cyanosis. Skin: Appropriate color for ethnicity. Warm without rashes, lesions, ecchymosis, ulcers.  Neuro: CN II-XII grossly normal. Normal muscle tone without cerebellar symptoms and intact sensation.   Psych: AO X 3,  appropriate mood and affect, insight and judgment.     Adela Glimpse, NP 10:02 AM [facility]

## 2023-04-22 ENCOUNTER — Encounter: Payer: Medicare Other | Admitting: Internal Medicine

## 2023-04-28 DIAGNOSIS — K080 Exfoliation of teeth due to systemic causes: Secondary | ICD-10-CM | POA: Diagnosis not present

## 2023-05-04 DIAGNOSIS — K080 Exfoliation of teeth due to systemic causes: Secondary | ICD-10-CM | POA: Diagnosis not present

## 2023-06-13 ENCOUNTER — Encounter: Payer: Self-pay | Admitting: Internal Medicine

## 2023-06-13 NOTE — Progress Notes (Unsigned)
[place]     ADULT & ADOLESCENT     INTERNAL MEDICINE  Jeffrey Gallagher, M.D.          Jeffrey Gallagher, A.NP        Jeffrey Gallagher, F.NP  [facility] [address]  [place], [place]. [address] Telephone [phone] Telefax [phone]   Annual  Screening/Preventative Visit  & Comprehensive Evaluation & Examination   Future Appointments  Date Time Provider Department  06/14/2023  2:00 PM Jeffrey Cowboy, MD [facility]  07/04/2024  2:00 PM Jeffrey Cowboy, MD [facility]            This very nice 67 y.o. MWM presents for a Screening /Preventative Visit & comprehensive evaluation and management of multiple medical co-morbidities.  Patient has been followed for HTN, HLD, Prediabetes and Vitamin D Deficiency. Patient is  followed by Dr Jeffrey Gallagher  on [place] for Ulcerative Colitis.  Abd CT scan on 02/03/2022 showed Aortic Atherosclerosis.        Labile  HTN predates circa 2007. Patient's BP has been monitored expectantly .  Today's BP is at goal - 132/70 . Patient denies any cardiac symptoms as chest pain, palpitations, shortness of breath, dizziness or ankle swelling.       Patient's hyperlipidemia has not been  controlled with diet in the past  and now he is on Rosuvastatin. . Patient denies myalgias or other medication SE's. Last lipids were at goal  except elevated Trig's :  Lab Results  Component Value Date   CHOL 117 02/09/2023   HDL 45 02/09/2023   LDLCALC 47 02/09/2023   TRIG 186 (H) 02/09/2023   CHOLHDL 2.6 02/09/2023         Patient has been monitored for glucose intolerance and patient denies reactive hypoglycemic symptoms, visual blurring, diabetic polys or paresthesias. Last A1c was normal & at goal :   Lab Results  Component Value Date   HGBA1C 5.5 11/02/2022         Finally, patient has history of Vitamin D Deficiency ("41" /2008) and  last vitamin D was at goal :   Lab Results  Component Value Date   VD25OH 94 11/02/2022        Current Outpatient Medications  Medication Instructions   aspirin 81 mg Daily   FLAX SEED OIL 1000 MG CAPS Takes  1 capsule  Daily   mesalamine (LIALDA)  4.8 g  Daily   Multiple Vitamins-Minerals  Take 1 tab daily    omeprazole   20 mg,  2 times daily before meals   rosuvastatin  20 MG tablet Take  1 tablet  Daily    No Known Allergies   Past Medical History:  Diagnosis Date   Anemia    Hyperlipidemia    Internal hemorrhoids    Ulcerative colitis, left sided (HCC)    Vitamin D deficiency      Health Maintenance  Topic Date Due   COVID-19 Vaccine (1) Never done   Zoster Vaccines- Shingrix (1 of 2) Never done   INFLUENZA VACCINE  Never done   Pneumonia Vaccine 39+ Years old (1 - PCV) 12/04/2022 (Originally 07/24/2021)   TETANUS/TDAP  12/04/2022 (Originally 07/14/2011)   Hepatitis C Screening  12/04/2022 (Originally 07/24/1973)   HIV Screening  Completed   HPV VACCINES  Aged Out     Immunization History  Administered Date(s) Administered   Td 07/13/2001    Last Colon - 07/22/2020 - Dr Jeffrey Gallagher   Past Surgical History:  Procedure Laterality Date   LAPAROSCOPIC APPENDECTOMY N/A 02/06/2022     Family History  Problem Relation Age of Onset   Coronary artery disease Father    Lung cancer Mother    Colon cancer Neg Hx    Esophageal cancer Neg Hx    Stomach cancer Neg Hx    Rectal cancer Neg Hx      Social History   Tobacco Use   Smoking status: Former    Types: Cigarettes    Quit date: 04/20/2003    Years since quitting: 19.0   Smokeless tobacco: Never  Vaping Use   Vaping Use: Never used  Substance Use Topics   Alcohol use: Yes    Alcohol/week: 7.0 standard drinks of alcohol    Types: 7 Cans of beer per week    Comment: 2 drinks a day   Drug use: No      ROS Constitutional: Denies fever, chills, weight loss/gain, headaches, insomnia,  night sweats or change in appetite. Does c/o fatigue. Eyes: Denies redness, blurred vision, diplopia,  discharge, itchy or watery eyes.  ENT: Denies discharge, congestion, post nasal drip, epistaxis, sore throat, earache, hearing loss, dental pain, Tinnitus, Vertigo, Sinus pain or snoring.  Cardio: Denies chest pain, palpitations, irregular heartbeat, syncope, dyspnea, diaphoresis, orthopnea, PND, claudication or edema Respiratory: denies cough, dyspnea, DOE, pleurisy, hoarseness, laryngitis or wheezing.  Gastrointestinal: Denies dysphagia, heartburn, reflux, water brash, pain, cramps, nausea, vomiting, bloating, diarrhea, constipation, hematemesis, melena, hematochezia, jaundice or hemorrhoids Genitourinary: Denies dysuria, frequency, urgency,  hesitancy, discharge, hematuria or flank pain. Report nocturia x 1-2 , rarely x 3.  Musculoskeletal: Denies arthralgia, myalgia, stiffness, Jt. Swelling, pain, limp or strain/sprain. Denies Falls. Skin: Denies puritis, rash, hives, warts, acne, eczema or change in skin lesion Neuro: No weakness, tremor, incoordination, spasms, paresthesia or pain Psychiatric: Denies confusion, memory loss or sensory loss. Denies Depression. Reports occas anxiety.  Endocrine: Denies change in weight, skin, hair change, nocturia, and paresthesia, diabetic polys, visual blurring or hyper / hypo glycemic episodes.  Heme/Lymph: No excessive bleeding, bruising or enlarged lymph nodes.   Physical Exam  BP 132/70   Pulse 75   Temp (!) 97.5 F (36.4 C)   Ht 5' 8.25" (1.734 m)   Wt 176 lb (79.8 kg)   SpO2 96%   BMI 26.57 kg/m   General Appearance: Well nourished and well groomed and in no apparent distress.  Eyes: PERRLA, EOMs, conjunctiva no swelling or erythema, normal fundi and vessels. Sinuses: No frontal/maxillary tenderness ENT/Mouth: EACs patent / TMs  nl. Nares clear without erythema, swelling, mucoid exudates. Oral hygiene is good. No erythema, swelling, or exudate. Tongue normal, non-obstructing. Tonsils not swollen or erythematous. Hearing normal.  Neck:  Supple, thyroid not palpable. No bruits, nodes or JVD. Respiratory: Respiratory effort normal.  BS equal and clear bilateral without rales, rhonci, wheezing or stridor. Cardio: Heart sounds are normal with regular rate and rhythm and no murmurs, rubs or gallops. Peripheral pulses are normal and equal bilaterally without edema. No aortic or femoral bruits. Chest: symmetric with normal excursions and percussion.  Abdomen: Soft, with Nl bowel sounds. Nontender, no guarding, rebound, hernias, masses, or organomegaly.  Lymphatics: Non tender without lymphadenopathy.  Musculoskeletal: Full ROM all peripheral extremities, joint stability, 5/5 strength, and normal gait. Skin: Warm and dry without rashes, lesions, cyanosis, clubbing or  ecchymosis.  Neuro: Cranial nerves intact, reflexes equal bilaterally. Normal muscle tone, no cerebellar symptoms. Sensation intact.  Pysch: Alert and oriented x  3 with normal  affect, insight and judgment appropriate.   Assessment and Plan  1. Annual Preventative/Screening Exam    2. Labile hypertension  - EKG 12-Lead - [place], RETROPERITNL ABD,  LTD - Urinalysis, Routine w reflex microscopic - Microalbumin / creatinine urine ratio - CBC with Differential/Platelet - COMPLETE METABOLIC PANEL WITH GFR - Magnesium - TSH   3. Hyperlipidemia, mixed  - EKG 12-Lead - [place], RETROPERITNL ABD,  LTD - Lipid panel - TSH   4. Abnormal glucose  - EKG 12-Lead - [place], RETROPERITNL ABD,  LTD - Hemoglobin A1c - Insulin, random   5. Vitamin D deficiency  - VITAMIN D 25 Hydroxy   6. ULCERATIVE COLITIS, LEFT SIDED  - POC Hemoccult Bld/Stl  - Sedimentation rate - C-reactive protein   7. Aortic atherosclerosis (HCC) by Abd CT scan on 02/03/2022  - EKG 12-Lead - [place], RETROPERITNL ABD,  LTD - Lipid panel   8. BPH with obstruction/lower urinary tract symptoms  - PSA   9. Prostate cancer screening  - PSA   10. Screening for heart disease  - EKG  12-Lead   11. FHx: heart disease  - EKG 12-Lead - [place], RETROPERITNL ABD,  LTD   12. Screening for AAA (aortic abdominal aneurysm)  - [place], RETROPERITNL ABD,  LTD   13. Medication management - Urinalysis, Routine w reflex microscopic - Microalbumin / creatinine urine ratio - CBC with Differential/Platelet - COMPLETE METABOLIC PANEL WITH GFR - Magnesium - Lipid panel - TSH - Hemoglobin A1c - Insulin, random - VITAMIN D 25 Hydroxy   14. Screening for colorectal cancer  - POC Hemoccult Bld/Stl            Patient was counseled in prudent diet, weight control to achieve/maintain BMI less than 25, BP monitoring, regular exercise and medications as discussed.  Discussed med effects and SE's  Routine screening labs and tests as requested with regular follow-up as recommended. Over 40 minutes of exam, counseling, chart review and high complex critical decision making was performed   Jeffrey Maw, MD

## 2023-06-13 NOTE — Patient Instructions (Signed)

## 2023-06-14 ENCOUNTER — Ambulatory Visit (INDEPENDENT_AMBULATORY_CARE_PROVIDER_SITE_OTHER): Payer: Medicare Other | Admitting: Internal Medicine

## 2023-06-14 ENCOUNTER — Encounter: Payer: Self-pay | Admitting: Internal Medicine

## 2023-06-14 VITALS — BP 132/70 | HR 75 | Temp 97.5°F | Ht 68.25 in | Wt 176.0 lb

## 2023-06-14 DIAGNOSIS — K5150 Left sided colitis without complications: Secondary | ICD-10-CM | POA: Diagnosis not present

## 2023-06-14 DIAGNOSIS — Z0000 Encounter for general adult medical examination without abnormal findings: Secondary | ICD-10-CM | POA: Diagnosis not present

## 2023-06-14 DIAGNOSIS — Z79899 Other long term (current) drug therapy: Secondary | ICD-10-CM | POA: Diagnosis not present

## 2023-06-14 DIAGNOSIS — Z0001 Encounter for general adult medical examination with abnormal findings: Secondary | ICD-10-CM

## 2023-06-14 DIAGNOSIS — R0989 Other specified symptoms and signs involving the circulatory and respiratory systems: Secondary | ICD-10-CM

## 2023-06-14 DIAGNOSIS — E559 Vitamin D deficiency, unspecified: Secondary | ICD-10-CM

## 2023-06-14 DIAGNOSIS — R7309 Other abnormal glucose: Secondary | ICD-10-CM

## 2023-06-14 DIAGNOSIS — I700 Atherosclerosis of aorta: Secondary | ICD-10-CM

## 2023-06-14 DIAGNOSIS — Z125 Encounter for screening for malignant neoplasm of prostate: Secondary | ICD-10-CM | POA: Diagnosis not present

## 2023-06-14 DIAGNOSIS — E782 Mixed hyperlipidemia: Secondary | ICD-10-CM

## 2023-06-14 DIAGNOSIS — D500 Iron deficiency anemia secondary to blood loss (chronic): Secondary | ICD-10-CM

## 2023-06-14 DIAGNOSIS — K2100 Gastro-esophageal reflux disease with esophagitis, without bleeding: Secondary | ICD-10-CM

## 2023-06-14 DIAGNOSIS — Z136 Encounter for screening for cardiovascular disorders: Secondary | ICD-10-CM | POA: Diagnosis not present

## 2023-06-14 DIAGNOSIS — Z1211 Encounter for screening for malignant neoplasm of colon: Secondary | ICD-10-CM

## 2023-06-14 DIAGNOSIS — N401 Enlarged prostate with lower urinary tract symptoms: Secondary | ICD-10-CM

## 2023-06-14 DIAGNOSIS — Z1212 Encounter for screening for malignant neoplasm of rectum: Secondary | ICD-10-CM

## 2023-06-14 DIAGNOSIS — Z8249 Family history of ischemic heart disease and other diseases of the circulatory system: Secondary | ICD-10-CM

## 2023-06-14 MED ORDER — MESALAMINE 1.2 G PO TBEC: DELAYED_RELEASE_TABLET | ORAL | 3 refills | Status: AC

## 2023-06-14 MED ORDER — OMEPRAZOLE 20 MG PO CPDR: DELAYED_RELEASE_CAPSULE | ORAL | 3 refills | Status: AC

## 2023-06-15 LAB — VITAMIN D 25 HYDROXY (VIT D DEFICIENCY, FRACTURES): Vit D, 25-Hydroxy: 63 ng/mL (ref 30–100)

## 2023-06-15 LAB — CBC WITH DIFFERENTIAL/PLATELET
Absolute Lymphocytes: 1761 {cells}/uL (ref 850–3900)
Absolute Monocytes: 530 {cells}/uL (ref 200–950)
Basophils Absolute: 23 {cells}/uL (ref 0–200)
Basophils Relative: 0.4 %
Eosinophils Absolute: 80 {cells}/uL (ref 15–500)
Eosinophils Relative: 1.4 %
HCT: 41.8 % (ref 38.5–50.0)
Hemoglobin: 13.7 g/dL (ref 13.2–17.1)
MCH: 31 pg (ref 27.0–33.0)
MCHC: 32.8 g/dL (ref 32.0–36.0)
MCV: 94.6 fL (ref 80.0–100.0)
MPV: 10.4 fL (ref 7.5–12.5)
Monocytes Relative: 9.3 %
Neutro Abs: 3306 {cells}/uL (ref 1500–7800)
Neutrophils Relative %: 58 %
Platelets: 294 10*3/uL (ref 140–400)
RBC: 4.42 10*6/uL (ref 4.20–5.80)
RDW: 12.8 % (ref 11.0–15.0)
Total Lymphocyte: 30.9 %
WBC: 5.7 10*3/uL (ref 3.8–10.8)

## 2023-06-15 LAB — COMPLETE METABOLIC PANEL WITH GFR
AG Ratio: 1.6 (calc) (ref 1.0–2.5)
ALT: 19 U/L (ref 9–46)
AST: 16 U/L (ref 10–35)
Albumin: 4.1 g/dL (ref 3.6–5.1)
Alkaline phosphatase (APISO): 78 U/L (ref 35–144)
BUN: 16 mg/dL (ref 7–25)
CO2: 29 mmol/L (ref 20–32)
Calcium: 9 mg/dL (ref 8.6–10.3)
Chloride: 103 mmol/L (ref 98–110)
Creat: 1.31 mg/dL (ref 0.70–1.35)
Globulin: 2.5 g/dL (ref 1.9–3.7)
Glucose, Bld: 94 mg/dL (ref 65–99)
Potassium: 4.4 mmol/L (ref 3.5–5.3)
Sodium: 139 mmol/L (ref 135–146)
Total Bilirubin: 0.5 mg/dL (ref 0.2–1.2)
Total Protein: 6.6 g/dL (ref 6.1–8.1)
eGFR: 60 mL/min/{1.73_m2} (ref >=60)

## 2023-06-15 LAB — IRON,TIBC AND FERRITIN PANEL
%SAT: 27 % (ref 20–48)
Ferritin: 64 ng/mL (ref 24–380)
Iron: 83 ug/dL (ref 50–180)
TIBC: 303 ug/dL (ref 250–425)

## 2023-06-15 LAB — URINALYSIS, ROUTINE W REFLEX MICROSCOPIC
Bilirubin Urine: NEGATIVE
Glucose, UA: NEGATIVE
Hgb urine dipstick: NEGATIVE
Ketones, ur: NEGATIVE
Leukocytes,Ua: NEGATIVE
Nitrite: NEGATIVE
Protein, ur: NEGATIVE
Specific Gravity, Urine: 1.021 (ref 1.001–1.035)
pH: 5.5 (ref 5.0–8.0)

## 2023-06-15 LAB — LIPID PANEL
Cholesterol: 207 mg/dL — ABNORMAL HIGH (ref <200)
HDL: 46 mg/dL (ref >=40)
LDL Cholesterol (Calc): 122 mg/dL — ABNORMAL HIGH
Non-HDL Cholesterol (Calc): 161 mg/dL — ABNORMAL HIGH (ref <130)
Total CHOL/HDL Ratio: 4.5 (calc) (ref <5.0)
Triglycerides: 239 mg/dL — ABNORMAL HIGH (ref <150)

## 2023-06-15 LAB — PSA: PSA: 2.09 ng/mL (ref <=4.00)

## 2023-06-15 LAB — SEDIMENTATION RATE: Sed Rate: 2 mm/h (ref 0–20)

## 2023-06-15 LAB — INSULIN, RANDOM: Insulin: 15.4 u[IU]/mL

## 2023-06-15 LAB — TSH: TSH: 2.64 m[IU]/L (ref 0.40–4.50)

## 2023-06-15 LAB — MICROALBUMIN / CREATININE URINE RATIO
Creatinine, Urine: 158 mg/dL (ref 20–320)
Microalb, Ur: <0.2 mg/dL

## 2023-06-15 LAB — HEMOGLOBIN A1C
Hgb A1c MFr Bld: 5.6 %{Hb} (ref <5.7)
Mean Plasma Glucose: 114 mg/dL
eAG (mmol/L): 6.3 mmol/L

## 2023-06-15 LAB — C-REACTIVE PROTEIN: CRP: <3 mg/L (ref <8.0)

## 2023-06-15 LAB — MAGNESIUM: Magnesium: 1.9 mg/dL (ref 1.5–2.5)

## 2023-06-15 NOTE — Progress Notes (Signed)
[] [] [] [] [] [] [] [] [] [] [] [] [] [] [] [] [] [] [] [] [] [] [] [] [] [] [] [] [] [] [] [] [] [] [] [] [] [] [] [] [] ][] [] [] [] [] [] [] [] [] [] [] [] [] [] [] [] [] [] [] [] [] [] [[] [] [] [] []  [] [] [] [] [] [] [] [] [] [] [] [] [] [] [] [] [] [] [] [] [] [] [] [] [] [] [] [] [] [] [] [] [] [] [] [] [] [] [] [] [] ][] [] [] [] [] [] [] [] [] [] [] [] [] [] [] [] [] [] [] [] [] [] [[] [] [] [] []  -Test results slightly outside the reference range are not unusual. If there is anything important, I will review this with you,  otherwise it is considered normal test values.  If you have further questions,  please do not hesitate to contact me at the office or via My Chart.  [] [] [] [] [] [] [] [] [] [] [] [] [] [] [] [] [] [] [] [] [] [] [] [] [] [] [] [] [] [] [] [] [] [] [] [] [] [] [] [] [] ][] [] [] [] [] [] [] [] [] [] [] [] [] [] [] [] [] [] [] [] [] [] [[] [] [] [] []  [] [] [] [] [] [] [] [] [] [] [] [] [] [] [] [] [] [] [] [] [] [] [] [] [] [] [] [] [] [] [] [] [] [] [] [] [] [] [] [] [] ][] [] [] [] [] [] [] [] [] [] [] [] [] [] [] [] [] [] [] [] [] [] [[] [] [] [] []   -  Total  Chol =   207    - Elevated             (  Ideal  or  Goal is less than 180  !  )  & -  Bad / Dangerous LDL  Chol =  122   - also Elevated              (  Ideal  or  Goal is less than 70  !  )   - Please be sure taking your Rosuvastatin / Crestor every day &   - Diet is still very important  - Cholesterol is too high - Recommend low cholesterol diet   - Cholesterol only comes from animal sources                                                                                                    - ie. meat, dairy, egg yolks  - Eat all the vegetables you want.  - Avoid Meat, Avoid Meat,  Avoid Meat                                                          - especially Red Meat - Beef AND Pork .  - Avoid cheese & dairy - milk & ice cream.     - Cheese is the most concentrated form of trans-fats which                                                           is the worst thing to clog up our arteries.    - Veggie cheese is OK which can be found in the fresh                         produce section at [employer] or [employer] or  [employer]  [] [] [] [] [] [] [] [] [] [] [] [] [] [] [] [] [] [] [] [] [] [] [] [] [] [] [] [] [] [] [] [] [] [] [] [] [] [] [] [] [] ][] [] [] [] [] [] [] [] [] [] [] [] [] [] [] [] [] [] [] [] [] [] [[] [] [] [] []   -   Also Triglycerides (  =  239 ) or fats in blood are too high                 (   Ideal or  Goal is less than 150  !  )    - Recommend avoid fried & greasy foods,  sweets / candy,   - Avoid white rice  (brown or wild rice or Quinoa is OK),   - Avoid white  potatoes  (sweet potatoes are OK)   - Avoid anything made from white flour  - bagels, doughnuts, rolls, buns, biscuits, white and   wheat breads, pizza crust and traditional pasta made of white flour & egg white  - (vegetarian pasta or spinach or wheat pasta is OK).    - Multi-grain bread is OK - like multi-grain flat bread or  sandwich thins.   - Avoid alcohol in excess.   - Exercise is also important.  [] [] [] [] [] [] [] [] [] [] [] [] [] [] [] [] [] [] [] [] [] [] [] [] [] [] [] [] [] [] [] [] [] [] [] [] [] [] [] [] [] ][] [] [] [] [] [] [] [] [] [] [] [] [] [] [] [] [] [] [] [] [] [] [[] [] [] [] []   -   PSA - Still very low - No Prostate Cancer  [] [] [] [] [] [] [] [] [] [] [] [] [] [] [] [] [] [] [] [] [] [] [] [] [] [] [] [] [] [] [] [] [] [] [] [] [] [] [] [] [] ][] [] [] [] [] [] [] [] [] [] [] [] [] [] [] [] [] [] [] [] [] [] [[] [] [] [] []   -   A1c - Still Normal - No Diabetes - Great !  [] [] [] [] [] [] [] [] [] [] [] [] [] [] [] [] [] [] [] [] [] [] [] [] [] [] [] [] [] [] [] [] [] [] [] [] [] [] [] [] [] ][] [] [] [] [] [] [] [] [] [] [] [] [] [] [] [] [] [] [] [] [] [] [[] [] [] [] []   -   Vitamin D = 63 - Great  - Please keep dosage same  !   [] [] [] [] [] [] [] [] [] [] [] [] [] [] [] [] [] [] [] [] [] [] [] [] [] [] [] [] [] [] [] [] [] [] [] [] [] [] [] [] [] ][] [] [] [] [] [] [] [] [] [] [] [] [] [] [] [] [] [] [] [] [] [] [[] [] [] [] []   -   Iron Stores are Normal & OK   [] [] [] [] [] [] [] [] [] [] [] [] [] [] [] [] [] [] [] [] [] [] [] [] [] [] [] [] [] [] [] [] [] [] [] [] [] [] [] [] [] ][] [] [] [] [] [] [] [] [] [] [] [] [] [] [] [] [] [] [] [] [] [] [[] [] [] [] []   -   Both Sed Rate  &  CPR levels - Both very Low - Wonderful  !  [] [] [] [] [] [] [] [] [] [] [] [] [] [] [] [] [] [] [] [] [] [] [] [] [] [] [] [] [] [] [] [] [] [] [] [] [] [] [] [] [] ][] [] [] [] [] [] [] [] [] [] [] [] [] [] [] [] [] [] [] [] [] [] [[] [] [] [] []   - Keep up the [place] Work !  [] [] [] [] [] [] [] [] [] [] [] [] [] [] [] [] [] [] [] [] [] [] [] [] [] [] [] [] [] [] [] [] [] [] [] [] [] [] [] [] [] ][] [] [] [] [] [] [] [] [] [] [] [] [] [] [] [] [] [] [] [] [] [] [[] [] [] [] []

## 2023-08-16 DIAGNOSIS — H43392 Other vitreous opacities, left eye: Secondary | ICD-10-CM | POA: Diagnosis not present

## 2023-08-16 DIAGNOSIS — H524 Presbyopia: Secondary | ICD-10-CM | POA: Diagnosis not present

## 2023-08-18 DIAGNOSIS — C44311 Basal cell carcinoma of skin of nose: Secondary | ICD-10-CM | POA: Diagnosis not present

## 2023-08-18 DIAGNOSIS — D485 Neoplasm of uncertain behavior of skin: Secondary | ICD-10-CM | POA: Diagnosis not present

## 2023-08-18 DIAGNOSIS — C44319 Basal cell carcinoma of skin of other parts of face: Secondary | ICD-10-CM | POA: Diagnosis not present

## 2023-08-24 DIAGNOSIS — D485 Neoplasm of uncertain behavior of skin: Secondary | ICD-10-CM | POA: Diagnosis not present

## 2023-08-25 DIAGNOSIS — H35462 Secondary vitreoretinal degeneration, left eye: Secondary | ICD-10-CM | POA: Diagnosis not present

## 2023-08-25 DIAGNOSIS — H2513 Age-related nuclear cataract, bilateral: Secondary | ICD-10-CM | POA: Diagnosis not present

## 2023-08-25 DIAGNOSIS — H43813 Vitreous degeneration, bilateral: Secondary | ICD-10-CM | POA: Diagnosis not present

## 2023-08-25 DIAGNOSIS — H35372 Puckering of macula, left eye: Secondary | ICD-10-CM | POA: Diagnosis not present

## 2023-09-02 DIAGNOSIS — C44311 Basal cell carcinoma of skin of nose: Secondary | ICD-10-CM | POA: Diagnosis not present

## 2023-09-13 ENCOUNTER — Ambulatory Visit: Payer: Medicare Other | Admitting: Nurse Practitioner

## 2023-09-14 DIAGNOSIS — E785 Hyperlipidemia, unspecified: Secondary | ICD-10-CM | POA: Diagnosis not present

## 2023-09-14 DIAGNOSIS — K219 Gastro-esophageal reflux disease without esophagitis: Secondary | ICD-10-CM | POA: Diagnosis not present

## 2023-09-14 DIAGNOSIS — R300 Dysuria: Secondary | ICD-10-CM | POA: Diagnosis not present

## 2023-09-14 DIAGNOSIS — K5190 Ulcerative colitis, unspecified, without complications: Secondary | ICD-10-CM | POA: Diagnosis not present

## 2023-09-20 DIAGNOSIS — C44319 Basal cell carcinoma of skin of other parts of face: Secondary | ICD-10-CM | POA: Diagnosis not present

## 2023-09-29 DIAGNOSIS — R3914 Feeling of incomplete bladder emptying: Secondary | ICD-10-CM | POA: Diagnosis not present

## 2023-09-29 DIAGNOSIS — R3912 Poor urinary stream: Secondary | ICD-10-CM | POA: Diagnosis not present

## 2023-09-29 DIAGNOSIS — N401 Enlarged prostate with lower urinary tract symptoms: Secondary | ICD-10-CM | POA: Diagnosis not present

## 2023-10-27 DIAGNOSIS — C44311 Basal cell carcinoma of skin of nose: Secondary | ICD-10-CM | POA: Diagnosis not present

## 2023-10-28 DIAGNOSIS — K080 Exfoliation of teeth due to systemic causes: Secondary | ICD-10-CM | POA: Diagnosis not present

## 2023-11-17 DIAGNOSIS — K080 Exfoliation of teeth due to systemic causes: Secondary | ICD-10-CM | POA: Diagnosis not present

## 2023-11-19 DIAGNOSIS — K5190 Ulcerative colitis, unspecified, without complications: Secondary | ICD-10-CM | POA: Diagnosis not present

## 2023-11-19 DIAGNOSIS — E785 Hyperlipidemia, unspecified: Secondary | ICD-10-CM | POA: Diagnosis not present

## 2023-11-19 DIAGNOSIS — K219 Gastro-esophageal reflux disease without esophagitis: Secondary | ICD-10-CM | POA: Diagnosis not present

## 2023-11-25 DIAGNOSIS — H35462 Secondary vitreoretinal degeneration, left eye: Secondary | ICD-10-CM | POA: Diagnosis not present

## 2023-11-25 DIAGNOSIS — H35372 Puckering of macula, left eye: Secondary | ICD-10-CM | POA: Diagnosis not present

## 2023-11-25 DIAGNOSIS — H2513 Age-related nuclear cataract, bilateral: Secondary | ICD-10-CM | POA: Diagnosis not present

## 2023-11-25 DIAGNOSIS — H43813 Vitreous degeneration, bilateral: Secondary | ICD-10-CM | POA: Diagnosis not present

## 2023-11-29 DIAGNOSIS — R3914 Feeling of incomplete bladder emptying: Secondary | ICD-10-CM | POA: Diagnosis not present

## 2023-11-29 DIAGNOSIS — N401 Enlarged prostate with lower urinary tract symptoms: Secondary | ICD-10-CM | POA: Diagnosis not present

## 2023-11-29 DIAGNOSIS — R3912 Poor urinary stream: Secondary | ICD-10-CM | POA: Diagnosis not present

## 2023-12-07 DIAGNOSIS — N401 Enlarged prostate with lower urinary tract symptoms: Secondary | ICD-10-CM | POA: Diagnosis not present

## 2023-12-07 DIAGNOSIS — R3914 Feeling of incomplete bladder emptying: Secondary | ICD-10-CM | POA: Diagnosis not present

## 2023-12-07 DIAGNOSIS — R3912 Poor urinary stream: Secondary | ICD-10-CM | POA: Diagnosis not present

## 2023-12-09 DIAGNOSIS — R300 Dysuria: Secondary | ICD-10-CM | POA: Diagnosis not present

## 2023-12-13 DIAGNOSIS — L578 Other skin changes due to chronic exposure to nonionizing radiation: Secondary | ICD-10-CM | POA: Diagnosis not present

## 2023-12-13 DIAGNOSIS — L905 Scar conditions and fibrosis of skin: Secondary | ICD-10-CM | POA: Diagnosis not present

## 2023-12-13 DIAGNOSIS — Z08 Encounter for follow-up examination after completed treatment for malignant neoplasm: Secondary | ICD-10-CM | POA: Diagnosis not present

## 2023-12-13 DIAGNOSIS — L820 Inflamed seborrheic keratosis: Secondary | ICD-10-CM | POA: Diagnosis not present

## 2023-12-13 DIAGNOSIS — D492 Neoplasm of unspecified behavior of bone, soft tissue, and skin: Secondary | ICD-10-CM | POA: Diagnosis not present

## 2023-12-13 DIAGNOSIS — Z85828 Personal history of other malignant neoplasm of skin: Secondary | ICD-10-CM | POA: Diagnosis not present

## 2023-12-14 ENCOUNTER — Telehealth: Payer: Self-pay | Admitting: Pulmonary Disease

## 2023-12-14 DIAGNOSIS — R911 Solitary pulmonary nodule: Secondary | ICD-10-CM

## 2023-12-14 NOTE — Telephone Encounter (Signed)
 [facility] pt, has scheduled upcoming appt to see RB. Dr. Baldwin Levee can you advise if OK to place new CT order

## 2023-12-14 NOTE — Telephone Encounter (Signed)
 Patient needs new CT order due to [facility] not being with [facility].

## 2023-12-15 NOTE — Telephone Encounter (Signed)
 New order placed. Can this be scheduled asap before ov 6/18 with RB?

## 2023-12-15 NOTE — Telephone Encounter (Signed)
 Yes ok to order

## 2023-12-15 NOTE — Telephone Encounter (Signed)
 Ct has been scheduled and patient is aware. NFN

## 2023-12-20 DIAGNOSIS — R3912 Poor urinary stream: Secondary | ICD-10-CM | POA: Diagnosis not present

## 2023-12-20 DIAGNOSIS — N401 Enlarged prostate with lower urinary tract symptoms: Secondary | ICD-10-CM | POA: Diagnosis not present

## 2023-12-20 DIAGNOSIS — R3914 Feeling of incomplete bladder emptying: Secondary | ICD-10-CM | POA: Diagnosis not present

## 2023-12-21 ENCOUNTER — Other Ambulatory Visit: Payer: Self-pay | Admitting: Urology

## 2023-12-23 ENCOUNTER — Ambulatory Visit: Payer: Medicare Other | Admitting: Internal Medicine

## 2023-12-24 ENCOUNTER — Ambulatory Visit (HOSPITAL_COMMUNITY)
Admission: RE | Admit: 2023-12-24 | Discharge: 2023-12-24 | Disposition: A | Discharge status: Home / Self Care | Source: Ambulatory Visit | Attending: Emergency Medicine | Admitting: Emergency Medicine

## 2023-12-24 DIAGNOSIS — R911 Solitary pulmonary nodule: Secondary | ICD-10-CM | POA: Diagnosis not present

## 2023-12-24 DIAGNOSIS — J432 Centrilobular emphysema: Secondary | ICD-10-CM | POA: Diagnosis not present

## 2023-12-27 ENCOUNTER — Other Ambulatory Visit: Payer: Self-pay | Admitting: Urology

## 2023-12-27 NOTE — Progress Notes (Signed)
 Spelling errors in consent order. Left voicemail with Dr. Farris Hong scheduler regarding issue.

## 2023-12-27 NOTE — Patient Instructions (Addendum)
 SURGICAL WAITING ROOM VISITATION  Patients having surgery or a procedure may have no more than 2 support people in the waiting area - these visitors may rotate.    Children under the age of 61 must have an adult with them who is not the patient.  Visitors with respiratory illnesses are discouraged from visiting and should remain at home.  If the patient needs to stay at the hospital during part of their recovery, the visitor guidelines for inpatient rooms apply. Pre-op nurse will coordinate an appropriate time for 1 support person to accompany patient in pre-op.  This support person may not rotate.    Please refer to the [facility] website for the visitor guidelines for Inpatients (after your surgery is over and you are in a regular room).    Your procedure is scheduled on: 12/29/23   Report to [facility] Main Entrance    Report to admitting at 11:45 AM   Call this number if you have problems the morning of surgery [phone]   Do not eat food or drink liquids :After Midnight.          If you have questions, please contact your surgeon's office.   FOLLOW BOWEL PREP AND ANY ADDITIONAL PRE OP INSTRUCTIONS YOU RECEIVED FROM YOUR SURGEON'S OFFICE!!!     Oral Hygiene is also important to reduce your risk of infection.                                    Remember - BRUSH YOUR TEETH THE MORNING OF SURGERY WITH YOUR REGULAR TOOTHPASTE  DENTURES WILL BE REMOVED PRIOR TO SURGERY PLEASE DO NOT APPLY Poly grip OR ADHESIVES!!!   Stop all vitamins and herbal supplements 7 days before surgery.   Take these medicines the morning of surgery with A SIP OF WATER: Omeprazole , Rosuvastatin                                You may not have any metal on your body including jewelry, and body piercing             Do not wear lotions, powders, cologne, or deodorant              Men may shave face and neck.   Do not bring valuables to the hospital. [facility] IS NOT              RESPONSIBLE   FOR VALUABLES.   Contacts, glasses, dentures or bridgework may not be worn into surgery.   Bring small overnight bag day of surgery.   DO NOT BRING YOUR HOME MEDICATIONS TO THE HOSPITAL. PHARMACY WILL DISPENSE MEDICATIONS LISTED ON YOUR MEDICATION LIST TO YOU DURING YOUR ADMISSION IN THE HOSPITAL!   Special Instructions: Bring a copy of your healthcare power of attorney and living will documents the day of surgery if you haven't scanned them before.              Please read over the following fact sheets you were given: IF YOU HAVE QUESTIONS ABOUT YOUR PRE-OP INSTRUCTIONS PLEASE CALL [phone] Party    If you received a COVID test during your pre-op visit  it is requested that you wear a mask when out in public, stay away from anyone that may not be feeling well and notify your surgeon if you develop symptoms. If you  test positive for Covid or have been in contact with anyone that has tested positive in the last 10 days please notify you surgeon.    [facility] - Preparing for Surgery Before surgery, you can play an important role.  Because skin is not sterile, your skin needs to be as free of germs as possible.  You can reduce the number of germs on your skin by washing with CHG (chlorahexidine gluconate) soap before surgery.  CHG is an antiseptic cleaner which kills germs and bonds with the skin to continue killing germs even after washing. Please DO NOT use if you have an allergy to CHG or antibacterial soaps.  If your skin becomes reddened/irritated stop using the CHG and inform your nurse when you arrive at Short Stay. Do not shave (including legs and underarms) for at least 48 hours prior to the first CHG shower.  You may shave your face/neck.  Please follow these instructions carefully:  1.  Shower with CHG Soap the night before surgery and the  morning of surgery.  2.  If you choose to wash your hair, wash your hair first as usual with your normal  shampoo.  3.   After you shampoo, rinse your hair and body thoroughly to remove the shampoo.                             4.  Use CHG as you would any other liquid soap.  You can apply chg directly to the skin and wash.  Gently with a scrungie or clean washcloth.  5.  Apply the CHG Soap to your body ONLY FROM THE NECK DOWN.   Do   not use on face/ open                           Wound or open sores. Avoid contact with eyes, ears mouth and   genitals (private parts).                       Wash face,  Genitals (private parts) with your normal soap.             6.  Wash thoroughly, paying special attention to the area where your    surgery  will be performed.  7.  Thoroughly rinse your body with warm water from the neck down.  8.  DO NOT shower/wash with your normal soap after using and rinsing off the CHG Soap.                9.  Pat yourself dry with a clean towel.            10.  Wear clean pajamas.            11.  Place clean sheets on your bed the night of your first shower and do not  sleep with pets. Day of Surgery : Do not apply any lotions/deodorants the morning of surgery.  Please wear clean clothes to the hospital/surgery center.  FAILURE TO FOLLOW THESE INSTRUCTIONS MAY RESULT IN THE CANCELLATION OF YOUR SURGERY  PATIENT SIGNATURE_________________________________  NURSE SIGNATURE__________________________________  ________________________________________________________________________

## 2023-12-27 NOTE — Progress Notes (Signed)
 COVID Vaccine Completed:  Date of COVID positive in last 90 days:  PCP - Ronna Coho, MD Cardiologist -   CT- 12/24/23 Epic Chest x-ray -  EKG - 06/15/23 Epic Stress Test -  ECHO -  Cardiac Cath -  Pacemaker/ICD device last checked: Spinal Cord Stimulator:  Bowel Prep -   Sleep Study -  CPAP -   Fasting Blood Sugar -  Checks Blood Sugar _____ times a day  Last dose of GLP1 agonist-  N/A GLP1 instructions:  Hold 7 days before surgery    Last dose of SGLT-2 inhibitors-  N/A SGLT-2 instructions:  Hold 3 days before surgery    Blood Thinner Instructions:  Last dose:   Time: Aspirin Instructions: Last Dose:  Activity level:  Can go up a flight of stairs and perform activities of daily living without stopping and without symptoms of chest pain or shortness of breath.  Able to exercise without symptoms  Unable to go up a flight of stairs without symptoms of     Anesthesia review:   Patient denies shortness of breath, fever, cough and chest pain at PAT appointment  Patient verbalized understanding of instructions that were given to them at the PAT appointment. Patient was also instructed that they will need to review over the PAT instructions again at home before surgery.

## 2023-12-28 ENCOUNTER — Encounter (HOSPITAL_COMMUNITY)
Admission: RE | Admit: 2023-12-28 | Discharge: 2023-12-28 | Disposition: A | Discharge status: Home / Self Care | Source: Ambulatory Visit | Attending: Urology

## 2023-12-28 ENCOUNTER — Other Ambulatory Visit: Payer: Self-pay

## 2023-12-28 ENCOUNTER — Encounter (HOSPITAL_COMMUNITY): Payer: Self-pay

## 2023-12-28 VITALS — BP 127/74 | HR 54 | Temp 98.0°F | Resp 16 | Ht 66.0 in | Wt 168.6 lb

## 2023-12-28 DIAGNOSIS — Z01818 Encounter for other preprocedural examination: Secondary | ICD-10-CM | POA: Diagnosis not present

## 2023-12-28 DIAGNOSIS — Z01812 Encounter for preprocedural laboratory examination: Secondary | ICD-10-CM | POA: Insufficient documentation

## 2023-12-28 DIAGNOSIS — I700 Atherosclerosis of aorta: Secondary | ICD-10-CM | POA: Diagnosis not present

## 2023-12-28 HISTORY — DX: Malignant (primary) neoplasm, unspecified: C80.1

## 2023-12-28 HISTORY — DX: Gastro-esophageal reflux disease without esophagitis: K21.9

## 2023-12-28 LAB — BASIC METABOLIC PANEL WITH GFR
Anion gap: 8 (ref 5–15)
BUN: 18 mg/dL (ref 8–23)
CO2: 26 mmol/L (ref 22–32)
Calcium: 8.7 mg/dL — ABNORMAL LOW (ref 8.9–10.3)
Chloride: 102 mmol/L (ref 98–111)
Creatinine, Ser: 1.01 mg/dL (ref 0.61–1.24)
GFR, Estimated: >60 mL/min (ref >60)
Glucose, Bld: 106 mg/dL — ABNORMAL HIGH (ref 70–99)
Potassium: 3.8 mmol/L (ref 3.5–5.1)
Sodium: 136 mmol/L (ref 135–145)

## 2023-12-28 LAB — CBC
HCT: 40.3 % (ref 39.0–52.0)
Hemoglobin: 13.1 g/dL (ref 13.0–17.0)
MCH: 31.7 pg (ref 26.0–34.0)
MCHC: 32.5 g/dL (ref 30.0–36.0)
MCV: 97.6 fL (ref 80.0–100.0)
Platelets: 300 10*3/uL (ref 150–400)
RBC: 4.13 MIL/uL — ABNORMAL LOW (ref 4.22–5.81)
RDW: 13.2 % (ref 11.5–15.5)
WBC: 5.5 10*3/uL (ref 4.0–10.5)
nRBC: 0 % (ref 0.0–0.2)

## 2023-12-28 NOTE — H&P (Signed)
 Office Visit Report     12/20/2023   --------------------------------------------------------------------------------   Jeffrey Gallagher  MRN: [id number]  DOB: 01-Jan-1956, 68 year old Male  PRIMARY CARE:  Renetta Carter, MD  PRIMARY CARE FAX:  [phone]  REFERRING:  Veryl Gottron A. Sherrine Dolly, MD  PROVIDER:  Yevonne Heman, M.D.  LOCATION:  [facility] [phone]     --------------------------------------------------------------------------------   CC/HPI: Bladder issues   Jeffrey Gallagher is 68 year old male with a history BPH and incomplete bladder emptying.   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 He notes significant improvement in his force of stream with less urgency and frequency of urination. He denies any associated side effects with tamsulosin, interval UTIs, dysuria or hematuria.   12/20/23: The patient is here today for routine follow-up after his recent UroCuff revealed a high-pressure, low flow voiding pattern with a Q-Maxx of 8.4 mL/s and a voiding pressure of 150.1 cmH2O. He is currently performing CIC BID w/o difficulty. Denies interval UTIs, dysuria or hematuria. UA today is clear.     ALLERGIES: No Allergies    MEDICATIONS: GoodSense Aspirin 81 MG Tablet Chewable  Omeprazole  20 MG Capsule Delayed Release  Tamsulosin HCl 0.4 MG Capsule 1 capsule PO Daily  Mesalamine  1.2 GM Tablet Delayed Release  Rosuvastatin  Calcium  20 MG Tablet  Vitamin D  TABS Oral      GU PSH: Complex cystometrogram, with voiding pressure studies, any technique - 12/07/2023 Complex Uroflow - 12/07/2023 Emg surf Electrd - 12/07/2023       PSH Notes: No Surgical Problems   NON-GU PSH: Appendectomy (laparoscopic) - about 2023 Skin Biopsy     GU PMH: BPH w/LUTS - 12/07/2023, - 11/29/2023, - 09/29/2023 Incomplete bladder emptying - 12/07/2023, - 11/29/2023, - 09/29/2023 Weak Urinary Stream - 12/07/2023, - 11/29/2023, - 09/29/2023 Elevated PSA, Elevated prostate specific antigen (PSA) - 2014      PMH Notes:  1898-07-13 00:00:00 - Note: Normal Routine History And Physical Adult  2012-04-28 13:13:02 - Note: No Medical Problems   NON-GU PMH: Anxiety GERD Hypercholesterolemia Skin Cancer, History    FAMILY HISTORY: Death In The Family Father - Father Death In The Family Mother - Mother Family Health Status Number - No Family History Heart Disease - Father Lung Cancer - Mother   SOCIAL HISTORY: Marital Status: Married Drinks 1 drink per day.      Notes: Former smoker, Caffeine Use, Occupation:, Alcohol Use, Marital History - Single   REVIEW OF SYSTEMS:    GU Review Male:   Patient denies frequent urination, hard to postpone urination, burning/ pain with urination, get up at night to urinate, leakage of urine, stream starts and stops, trouble starting your stream, have to strain to urinate , erection problems, and penile pain.  Gastrointestinal (Upper):   Patient denies nausea, vomiting, and indigestion/ heartburn.  Gastrointestinal (Lower):   Patient denies diarrhea and constipation.  Constitutional:   Patient denies fever, night sweats, weight loss, and fatigue.  Skin:   Patient denies skin  rash/ lesion and itching.  Eyes:   Patient denies blurred vision and double vision.  Ears/ Nose/ Throat:   Patient denies sore throat and sinus problems.  Hematologic/Lymphatic:   Patient denies swollen glands and easy bruising.  Cardiovascular:   Patient denies leg swelling and  chest pains.  Respiratory:   Patient denies cough and shortness of breath.  Endocrine:   Patient denies excessive thirst.  Musculoskeletal:   Patient denies back pain and joint pain.  Neurological:   Patient denies headaches and dizziness.  Psychologic:   Patient denies depression and anxiety.   VITAL SIGNS: None   MULTI-SYSTEM PHYSICAL EXAMINATION:    Constitutional: Well-nourished. No physical deformities. Normally developed. Good grooming.  Neurologic / Psychiatric: Oriented to time, oriented to place, oriented to person. No depression, no anxiety, no agitation.     Complexity of Data:  Lab Test Review:   PSA  Records Review:   Previous Patient Records  Urodynamics Review:   Review Flow Rate   09/29/23 04/28/12  PSA  Total PSA 3.29 ng/mL 2.33     PROCEDURES:          Urinalysis Dipstick Dipstick Cont'd  Color: Yellow Bilirubin: Neg mg/dL  Appearance: Clear Ketones: Neg mg/dL  Specific Gravity: 7.425 Blood: Neg ery/uL  pH: 5.5 Protein: Neg mg/dL  Glucose: Neg mg/dL Urobilinogen: 0.2 mg/dL    Nitrites: Neg    Leukocyte Esterase: Neg leu/uL    ASSESSMENT:      ICD-10 Details  1 GU:   BPH w/LUTS - N40.1 Chronic, Stable  2   Incomplete bladder emptying - R39.14 Chronic, Stable  3   Weak Urinary Stream - R39.12 Chronic, Stable   PLAN:           Schedule Return Visit/Planned Activity: Next Available Appointment - Schedule Surgery          Document Letter(s):  Created for Renetta Carter, MD   Created for Patient: Clinical Summary         Notes:   -The risks, benefits and alternatives of cystoscopy with TURP was discussed with the patient. The risks included, but are not limited to, bleeding, urinary tract infection, bladder perforation requiring prolonged catheterization and/or open bladder repair, ureteral injury, ureteral obstruction, urethral stricture disease, new or worsening voiding dysfunction, retrograde ejaculation, MI, CVA, PE, DVT and the inherent risks of  general anesthesia. We also discussed the need for Foley catheterization for at least 3 days post-op and the likely need for post-op observation in the hospital following the procedure. The patient voices understanding and wishes to proceed.

## 2023-12-29 ENCOUNTER — Ambulatory Visit (HOSPITAL_COMMUNITY): Admitting: Anesthesiology

## 2023-12-29 ENCOUNTER — Observation Stay (HOSPITAL_COMMUNITY)
Admission: RE | Admit: 2023-12-29 | Discharge: 2023-12-30 | Disposition: A | Discharge status: Home / Self Care | Attending: Urology | Admitting: Urology

## 2023-12-29 ENCOUNTER — Encounter (HOSPITAL_COMMUNITY): Payer: Self-pay | Admitting: Urology

## 2023-12-29 ENCOUNTER — Encounter: Payer: Medicare Other | Admitting: Emergency Medicine

## 2023-12-29 ENCOUNTER — Other Ambulatory Visit: Payer: Self-pay

## 2023-12-29 ENCOUNTER — Encounter (HOSPITAL_COMMUNITY)
Admission: RE | Disposition: A | Discharge status: Home / Self Care | Payer: Self-pay | Source: Home / Self Care | Attending: Urology

## 2023-12-29 DIAGNOSIS — N320 Bladder-neck obstruction: Secondary | ICD-10-CM | POA: Diagnosis not present

## 2023-12-29 DIAGNOSIS — Z85828 Personal history of other malignant neoplasm of skin: Secondary | ICD-10-CM | POA: Insufficient documentation

## 2023-12-29 DIAGNOSIS — N138 Other obstructive and reflux uropathy: Principal | ICD-10-CM | POA: Diagnosis present

## 2023-12-29 DIAGNOSIS — N401 Enlarged prostate with lower urinary tract symptoms: Secondary | ICD-10-CM

## 2023-12-29 DIAGNOSIS — Z79899 Other long term (current) drug therapy: Secondary | ICD-10-CM | POA: Insufficient documentation

## 2023-12-29 DIAGNOSIS — R3912 Poor urinary stream: Secondary | ICD-10-CM | POA: Insufficient documentation

## 2023-12-29 DIAGNOSIS — R3914 Feeling of incomplete bladder emptying: Secondary | ICD-10-CM | POA: Insufficient documentation

## 2023-12-29 DIAGNOSIS — N400 Enlarged prostate without lower urinary tract symptoms: Secondary | ICD-10-CM | POA: Diagnosis not present

## 2023-12-29 HISTORY — PX: TRANSURETHRAL RESECTION OF PROSTATE: SHX73

## 2023-12-29 SURGERY — TURP (TRANSURETHRAL RESECTION OF PROSTATE)
Anesthesia: General | Site: Prostate

## 2023-12-29 MED ORDER — CLINDAMYCIN PHOSPHATE 900 MG/50ML IV SOLN: 900.0000 mg | Freq: Once | INTRAVENOUS | Status: DC

## 2023-12-29 MED ORDER — MIDAZOLAM HCL 2 MG/2ML IJ SOLN
INTRAMUSCULAR | Status: DC | PRN
  Administered 2023-12-29: 2 mg via INTRAVENOUS

## 2023-12-29 MED ORDER — SENNOSIDES-DOCUSATE SODIUM 8.6-50 MG PO TABS: 1.0000 | ORAL_TABLET | Freq: Every evening | ORAL | Status: DC | PRN

## 2023-12-29 MED ORDER — PROPOFOL 10 MG/ML IV BOLUS
INTRAVENOUS | Status: DC | PRN
  Administered 2023-12-29: 150 mg via INTRAVENOUS

## 2023-12-29 MED ORDER — ACETAMINOPHEN 500 MG PO TABS
1000.0000 mg | ORAL_TABLET | Freq: Once | ORAL | Status: AC
  Administered 2023-12-29: 1000 mg via ORAL
  Filled 2023-12-29: qty 2

## 2023-12-29 MED ORDER — SODIUM CHLORIDE 0.9 % IV SOLN: INTRAVENOUS | Status: DC

## 2023-12-29 MED ORDER — EPHEDRINE 5 MG/ML INJ
INTRAVENOUS | Status: AC
  Filled 2023-12-29: qty 5

## 2023-12-29 MED ORDER — LIDOCAINE 2% (20 MG/ML) 5 ML SYRINGE
INTRAMUSCULAR | Status: DC | PRN
Start: 2023-12-29 — End: 2023-12-29
  Administered 2023-12-29: 60 mg via INTRAVENOUS

## 2023-12-29 MED ORDER — ACETAMINOPHEN 325 MG PO TABS
650.0000 mg | ORAL_TABLET | ORAL | Status: DC | PRN
Start: 2023-12-29 — End: 2023-12-30

## 2023-12-29 MED ORDER — SODIUM CHLORIDE 0.9 % IR SOLN
3000.0000 mL | Status: DC
  Administered 2023-12-29: 3000 mL

## 2023-12-29 MED ORDER — ONDANSETRON HCL 4 MG/2ML IJ SOLN
INTRAMUSCULAR | Status: AC
  Filled 2023-12-29: qty 2

## 2023-12-29 MED ORDER — DEXAMETHASONE SODIUM PHOSPHATE 10 MG/ML IJ SOLN
INTRAMUSCULAR | Status: DC | PRN
Start: 2023-12-29 — End: 2023-12-29
  Administered 2023-12-29: 10 mg via INTRAVENOUS

## 2023-12-29 MED ORDER — CHLORHEXIDINE GLUCONATE 0.12 % MT SOLN: 15.0000 mL | Freq: Once | OROMUCOSAL | Status: AC

## 2023-12-29 MED ORDER — PROPOFOL 10 MG/ML IV BOLUS
INTRAVENOUS | Status: AC
  Filled 2023-12-29: qty 20

## 2023-12-29 MED ORDER — EPHEDRINE SULFATE (PRESSORS) 50 MG/ML IJ SOLN
INTRAMUSCULAR | Status: DC | PRN
Start: 2023-12-29 — End: 2023-12-29
  Administered 2023-12-29 (×2): 10 mg via INTRAVENOUS

## 2023-12-29 MED ORDER — MESALAMINE 1.2 G PO TBEC
4.8000 g | DELAYED_RELEASE_TABLET | Freq: Every day | ORAL | Status: DC
  Administered 2023-12-30: 4.8 g via ORAL
  Filled 2023-12-29: qty 4

## 2023-12-29 MED ORDER — DIPHENHYDRAMINE HCL 50 MG/ML IJ SOLN: 12.5000 mg | Freq: Four times a day (QID) | INTRAMUSCULAR | Status: DC | PRN

## 2023-12-29 MED ORDER — DEXAMETHASONE SODIUM PHOSPHATE 10 MG/ML IJ SOLN
INTRAMUSCULAR | Status: AC
  Filled 2023-12-29: qty 1

## 2023-12-29 MED ORDER — ONDANSETRON HCL 4 MG/2ML IJ SOLN
INTRAMUSCULAR | Status: DC | PRN
  Administered 2023-12-29: 4 mg via INTRAVENOUS

## 2023-12-29 MED ORDER — ZOLPIDEM TARTRATE 5 MG PO TABS: 5.0000 mg | ORAL_TABLET | Freq: Every evening | ORAL | Status: DC | PRN

## 2023-12-29 MED ORDER — FENTANYL CITRATE (PF) 250 MCG/5ML IJ SOLN
INTRAMUSCULAR | Status: DC | PRN
Start: 2023-12-29 — End: 2023-12-29
  Administered 2023-12-29: 100 ug via INTRAVENOUS
  Administered 2023-12-29 (×2): 50 ug via INTRAVENOUS

## 2023-12-29 MED ORDER — ONDANSETRON HCL 4 MG/2ML IJ SOLN: 4.0000 mg | INTRAMUSCULAR | Status: DC | PRN

## 2023-12-29 MED ORDER — HYDROMORPHONE HCL 1 MG/ML IJ SOLN: 0.5000 mg | INTRAMUSCULAR | Status: DC | PRN

## 2023-12-29 MED ORDER — STERILE WATER FOR IRRIGATION IR SOLN
Status: DC | PRN
  Administered 2023-12-29: 500 mL

## 2023-12-29 MED ORDER — CEFAZOLIN SODIUM-DEXTROSE 2-3 GM-%(50ML) IV SOLR
INTRAVENOUS | Status: DC | PRN
  Administered 2023-12-29: 2 g via INTRAVENOUS

## 2023-12-29 MED ORDER — FENTANYL CITRATE (PF) 100 MCG/2ML IJ SOLN
INTRAMUSCULAR | Status: AC
  Filled 2023-12-29: qty 2

## 2023-12-29 MED ORDER — ORAL CARE MOUTH RINSE
15.0000 mL | Freq: Once | OROMUCOSAL | Status: AC
  Administered 2023-12-29: 15 mL via OROMUCOSAL

## 2023-12-29 MED ORDER — SODIUM CHLORIDE 0.9 % IR SOLN
Status: DC | PRN
  Administered 2023-12-29 (×2): 6000 mL
  Administered 2023-12-29: 3000 mL

## 2023-12-29 MED ORDER — OXYBUTYNIN CHLORIDE 5 MG PO TABS: 5.0000 mg | ORAL_TABLET | Freq: Three times a day (TID) | ORAL | Status: DC | PRN

## 2023-12-29 MED ORDER — CEFAZOLIN SODIUM-DEXTROSE 2-4 GM/100ML-% IV SOLN
2.0000 g | INTRAVENOUS | Status: DC
  Filled 2023-12-29: qty 100

## 2023-12-29 MED ORDER — HYDROCODONE-ACETAMINOPHEN 5-325 MG PO TABS: 1.0000 | ORAL_TABLET | ORAL | Status: DC | PRN

## 2023-12-29 MED ORDER — DIPHENHYDRAMINE HCL 12.5 MG/5ML PO ELIX
12.5000 mg | ORAL_SOLUTION | Freq: Four times a day (QID) | ORAL | Status: DC | PRN
Start: 2023-12-29 — End: 2023-12-30

## 2023-12-29 MED ORDER — MIDAZOLAM HCL 2 MG/2ML IJ SOLN
INTRAMUSCULAR | Status: AC
  Filled 2023-12-29: qty 2

## 2023-12-29 MED ORDER — LACTATED RINGERS IV SOLN: INTRAVENOUS | Status: DC

## 2023-12-29 MED ORDER — CEFAZOLIN SODIUM-DEXTROSE 2-4 GM/100ML-% IV SOLN
2.0000 g | Freq: Three times a day (TID) | INTRAVENOUS | Status: DC
Start: 2023-12-29 — End: 2023-12-30
  Administered 2023-12-29 – 2023-12-30 (×2): 2 g via INTRAVENOUS
  Filled 2023-12-29 (×3): qty 100

## 2023-12-29 MED ORDER — FENTANYL CITRATE PF 50 MCG/ML IJ SOSY: 25.0000 ug | PREFILLED_SYRINGE | INTRAMUSCULAR | Status: DC | PRN

## 2023-12-29 SURGICAL SUPPLY — 15 items
BAG URINE DRAIN 2000ML AR STRL (UROLOGICAL SUPPLIES) ×2 IMPLANT
BAG URO CATCHER STRL LF (MISCELLANEOUS) ×2 IMPLANT
CATH FOLEY 3WAY 30CC 22FR (CATHETERS) IMPLANT
DRAPE FOOT SWITCH (DRAPES) ×2 IMPLANT
GLOVE SURG LX STRL 8.0 MICRO (GLOVE) ×2 IMPLANT
GOWN STRL SURGICAL XL XLNG (GOWN DISPOSABLE) ×2 IMPLANT
HOLDER FOLEY CATH W/STRAP (MISCELLANEOUS) IMPLANT
KIT TURNOVER KIT A (KITS) ×4 IMPLANT
LOOP CUT BIPOLAR 24F LRG (ELECTROSURGICAL) IMPLANT
MANIFOLD NEPTUNE II (INSTRUMENTS) ×2 IMPLANT
PACK CYSTO (CUSTOM PROCEDURE TRAY) ×2 IMPLANT
PAD PREP 24X48 CUFFED NSTRL (MISCELLANEOUS) ×2 IMPLANT
SYRINGE TOOMEY IRRIG 70ML (MISCELLANEOUS) ×2 IMPLANT
TUBING CONNECTING 10 (TUBING) ×2 IMPLANT
TUBING UROLOGY SET (TUBING) ×2 IMPLANT

## 2023-12-29 NOTE — Anesthesia Postprocedure Evaluation (Signed)
 Anesthesia Post Note  Patient: [name]  Procedure(s) Performed: TURP (TRANSURETHRAL RESECTION OF PROSTATE) (Prostate)     Patient location during evaluation: PACU Anesthesia Type: General Level of consciousness: awake and alert Pain management: pain level controlled Vital Signs Assessment: post-procedure vital signs reviewed and stable Respiratory status: spontaneous breathing, nonlabored ventilation and respiratory function stable Cardiovascular status: stable and blood pressure returned to baseline Anesthetic complications: no   No notable events documented.  Last Vitals:  Vitals:   12/29/23 1630 12/29/23 1700  BP: 139/80 135/75  Pulse: (!) 58 67  Resp: 12 10  Temp: 36.4 C   SpO2: 99% 97%    Last Pain:  Vitals:   12/29/23 1700  TempSrc:   PainSc: Asleep                 Juventino Oppenheim

## 2023-12-29 NOTE — Transfer of Care (Signed)
 Immediate Anesthesia Transfer of Care Note  Patient: Jeffrey Gallagher  Procedure(s) Performed: TURP (TRANSURETHRAL RESECTION OF PROSTATE) (Prostate)  Patient Location: PACU  Anesthesia Type:General  Level of Consciousness: drowsy and patient cooperative  Airway & Oxygen Therapy: Patient Spontanous Breathing  Post-op Assessment: Report given to RN and Post -op Vital signs reviewed and stable  Post vital signs: Reviewed and stable  Last Vitals:  Vitals Value Taken Time  BP 129/74 12/29/23 15:32  Temp    Pulse 69 12/29/23 15:33  Resp 15 12/29/23 15:33  SpO2 96 % 12/29/23 15:33  Vitals shown include unfiled device data.  Last Pain:  Vitals:   12/29/23 1202  TempSrc: Oral  PainSc: 0-No pain         Complications: No notable events documented.

## 2023-12-29 NOTE — Anesthesia Preprocedure Evaluation (Addendum)
 Anesthesia Evaluation  Patient identified by MRN, date of birth, ID band Patient awake    Reviewed: Allergy & Precautions, H&P , NPO status , Patient's Chart, lab work & pertinent test results  Airway Mallampati: II  TM Distance: >3 FB Neck ROM: Full    Dental no notable dental hx. (+) Teeth Intact, Dental Advisory Given   Pulmonary former smoker   Pulmonary exam normal breath sounds clear to auscultation       Cardiovascular negative cardio ROS  Rhythm:Regular Rate:Normal     Neuro/Psych negative neurological ROS  negative psych ROS   GI/Hepatic Neg liver ROS, PUD,GERD  Medicated,,  Endo/Other  negative endocrine ROS    Renal/GU negative Renal ROS  negative genitourinary   Musculoskeletal   Abdominal   Peds  Hematology  (+) Blood dyscrasia, anemia   Anesthesia Other Findings   Reproductive/Obstetrics negative OB ROS                             Anesthesia Physical Anesthesia Plan  ASA: 2  Anesthesia Plan: General   Post-op Pain Management: Tylenol  PO (pre-op)*   Induction: Intravenous  PONV Risk Score and Plan: 3 and Ondansetron  and Dexamethasone   Airway Management Planned: LMA  Additional Equipment:   Intra-op Plan:   Post-operative Plan: Extubation in OR  Informed Consent: I have reviewed the patients History and Physical, chart, labs and discussed the procedure including the risks, benefits and alternatives for the proposed anesthesia with the patient or authorized representative who has indicated his/her understanding and acceptance.     Dental advisory given  Plan Discussed with: CRNA  Anesthesia Plan Comments:        Anesthesia Quick Evaluation

## 2023-12-29 NOTE — Anesthesia Procedure Notes (Signed)
 Procedure Name: LMA Insertion Date/Time: 12/29/2023 2:40 PM  Performed by: Delona Ferron, CRNAPre-anesthesia Checklist: Patient identified, Emergency Drugs available, Suction available, Patient being monitored and Timeout performed Patient Re-evaluated:Patient Re-evaluated prior to induction Oxygen Delivery Method: Circle system utilized Preoxygenation: Pre-oxygenation with 100% oxygen Induction Type: IV induction LMA: LMA inserted LMA Size: 4.0 Number of attempts: 1 Placement Confirmation: positive ETCO2 and breath sounds checked- equal and bilateral Tube secured with: Tape Dental Injury: Teeth and Oropharynx as per pre-operative assessment

## 2023-12-29 NOTE — Op Note (Signed)
 Operative Note  Preoperative diagnosis:  1.  BPH with bladder outlet obstruction  Postoperative diagnosis: 1.  BPH with bladder outlet obstruction 2.  Meatal stenosis  Procedure(s): 1.  Bipolar TURP 2.  Urethral dilation with Dustin Gimenez sounds  Surgeon: Yevonne Heman, MD  Assistants:  None  Anesthesia:  General  Complications:  None  EBL: 100 mL  Specimens: 1. Prostate chips  Drains/Catheters: 1.  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 male with BPH with incomplete bladder emptying.  Pressure flow studies in the office showed a 8.4 mL/s flow rate and a voiding pressure of 150 cm water.  Due to his elevated PVRs, he has  been performing CIC twice a day that any issues.  He is here today for a TURP.  He has been consented for the above procedures, voices understanding and wishes to proceed.  Description of procedure:  After informed consent was obtained, the patient was brought to the operating room and general anesthesia was administered. The patient was then placed in the dorsolithotomy position and prepped and draped in usual sterile fashion. A timeout was performed. A 23 French rigid cystoscope was then inserted into the urethral meatus and advanced into the bladder under direct vision. A complete bladder survey revealed no intravesical pathology.  Both ureteral orifices were identified and well away from the bladder neck.  I attempted to place the 26 [place] resectoscope, but met resistance at the urethral meatus.  Dustin Gimenez sounds were then used to dilate the urethral meatus, starting at 42 [place] and progressing up to 28 [place], and 2 [place] increments.  The rigid cystoscope was  then exchanged for a 26 French resectoscope with a bipolar loop working element.  Starting at the bladder neck and progressing distally to the verumontanum, the prostatic adenoma was systematically resected until a widely patent prostatic urethral channel was created.  All prostate chips were then hand irrigated out of the bladder and sent to pathology for permanent section.  The resectoscope was then removed and exchanged for a 22 French three-way Foley catheter.  The three-way Foley catheter was then extensively hand irrigated until the irrigant returned clear to light pink.  The catheter was then placed to continuous bladder irrigation and placed on rubber band traction.  He tolerated the procedure well and was transferred to the postanesthesia unit in stable condition.  Plan:  CBI overnight

## 2023-12-30 ENCOUNTER — Encounter (HOSPITAL_COMMUNITY): Payer: Self-pay | Admitting: Urology

## 2023-12-30 DIAGNOSIS — N320 Bladder-neck obstruction: Secondary | ICD-10-CM | POA: Diagnosis not present

## 2023-12-30 DIAGNOSIS — N401 Enlarged prostate with lower urinary tract symptoms: Secondary | ICD-10-CM | POA: Diagnosis not present

## 2023-12-30 DIAGNOSIS — Z79899 Other long term (current) drug therapy: Secondary | ICD-10-CM | POA: Diagnosis not present

## 2023-12-30 DIAGNOSIS — R3914 Feeling of incomplete bladder emptying: Secondary | ICD-10-CM | POA: Diagnosis not present

## 2023-12-30 DIAGNOSIS — Z85828 Personal history of other malignant neoplasm of skin: Secondary | ICD-10-CM | POA: Diagnosis not present

## 2023-12-30 DIAGNOSIS — R3912 Poor urinary stream: Secondary | ICD-10-CM | POA: Diagnosis not present

## 2023-12-30 LAB — BASIC METABOLIC PANEL WITH GFR
Anion gap: 7 (ref 5–15)
BUN: 15 mg/dL (ref 8–23)
CO2: 24 mmol/L (ref 22–32)
Calcium: 8.3 mg/dL — ABNORMAL LOW (ref 8.9–10.3)
Chloride: 102 mmol/L (ref 98–111)
Creatinine, Ser: 1.03 mg/dL (ref 0.61–1.24)
GFR, Estimated: >60 mL/min (ref >60)
Glucose, Bld: 154 mg/dL — ABNORMAL HIGH (ref 70–99)
Potassium: 3.9 mmol/L (ref 3.5–5.1)
Sodium: 133 mmol/L — ABNORMAL LOW (ref 135–145)

## 2023-12-30 LAB — SURGICAL PATHOLOGY

## 2023-12-30 LAB — HEMOGLOBIN AND HEMATOCRIT, BLOOD
HCT: 38.1 % — ABNORMAL LOW (ref 39.0–52.0)
Hemoglobin: 12.6 g/dL — ABNORMAL LOW (ref 13.0–17.0)

## 2023-12-30 MED ORDER — PHENAZOPYRIDINE HCL 200 MG PO TABS
200.0000 mg | ORAL_TABLET | Freq: Three times a day (TID) | ORAL | 0 refills | Status: DC | PRN
Start: 2023-12-30 — End: 2024-03-27

## 2023-12-30 MED ORDER — OXYBUTYNIN CHLORIDE 5 MG PO TABS: 5.0000 mg | ORAL_TABLET | Freq: Three times a day (TID) | ORAL | 1 refills | Status: DC | PRN

## 2023-12-30 NOTE — Progress Notes (Signed)
   12/30/23 0840  TOC Brief Assessment  Insurance and Status Reviewed  Patient has primary care physician Yes  Home environment has been reviewed Resides in townhome with spouse  Prior level of function: Independent with ADLs at baseline  Prior/Current Home Services No current home services  Social Drivers of Health Review SDOH reviewed no interventions necessary  Readmission risk has been reviewed Yes  Transition of care needs no transition of care needs at this time

## 2023-12-30 NOTE — Discharge Summary (Signed)
 Date of admission: 12/29/2023  Date of discharge: 12/30/2023  Admission diagnosis: BPH w/ LUTS  Discharge diagnosis: BPH w/ LUTS  Procedures: TURP  History and Physical: For full details, please see admission history and physical. Briefly, Jeffrey Gallagher is a 68 y.o. year old patient with BPH with incomplete bladder emptying requiring CIC.   Hospital Course: Routine post-op course following TURP.  D/c home with Foley   Physical Exam:  General: Alert and oriented CV: RRR, palpable distal pulses Lungs: CTAB, equal chest rise Abdomen: Soft, NTND, no rebound or guarding GU:  Foley draining clear urine w/o CBI Ext: NT, No erythema  Laboratory values:  Recent Labs    12/28/23 0753 12/30/23 0409  HGB 13.1 12.6*  HCT 40.3 38.1*   Recent Labs    12/28/23 0753 12/30/23 0409  CREATININE 1.01 1.03    Disposition: Home  Discharge instruction: The patient was instructed to be ambulatory but told to refrain from heavy lifting, strenuous activity, or driving.  Discharge medications:  Allergies as of 12/30/2023   No Known Allergies      Medication List     TAKE these medications    mesalamine  1.2 g EC tablet Commonly known as: LIALDA  Take 4 tablets (4.8 gm)  Daily  for Colitis                              /                                                                   TAKE                                         BY                                                 MOUTH What changed:  how much to take how to take this when to take this additional instructions   omeprazole  20 MG capsule Commonly known as: PRILOSEC Take  1 capsule  2 x / day to  Prevent Heartburn &  Indigestion                                 /                                                                   TAKE                                         BY  MOUTH What changed:  how much to take how to take this when to take this additional instructions    oxybutynin 5 MG tablet Commonly known as: DITROPAN Take 1 tablet (5 mg total) by mouth every 8 (eight) hours as needed for bladder spasms.   phenazopyridine 200 MG tablet Commonly known as: Pyridium Take 1 tablet (200 mg total) by mouth 3 (three) times daily as needed (for pain with urination).   rosuvastatin  20 MG tablet Commonly known as: Crestor  Take  1 tablet  Daily  for Cholesterol   Vitamin D  50 MCG (2000 UT) Caps Take 4,000 Units by mouth daily.        Followup:   Follow-up Information     [facility] Follow up on 01/04/2024.   Why: Post-op appointment at 0945 for catheter removal. Contact information: [address] [place] [address] [place]  [phone]

## 2024-02-03 DIAGNOSIS — R3914 Feeling of incomplete bladder emptying: Secondary | ICD-10-CM | POA: Diagnosis not present

## 2024-02-08 DIAGNOSIS — Z09 Encounter for follow-up examination after completed treatment for conditions other than malignant neoplasm: Secondary | ICD-10-CM | POA: Diagnosis not present

## 2024-02-08 DIAGNOSIS — F411 Generalized anxiety disorder: Secondary | ICD-10-CM | POA: Diagnosis not present

## 2024-02-08 DIAGNOSIS — K219 Gastro-esophageal reflux disease without esophagitis: Secondary | ICD-10-CM | POA: Diagnosis not present

## 2024-02-08 DIAGNOSIS — E785 Hyperlipidemia, unspecified: Secondary | ICD-10-CM | POA: Diagnosis not present

## 2024-02-16 DIAGNOSIS — R8279 Other abnormal findings on microbiological examination of urine: Secondary | ICD-10-CM | POA: Diagnosis not present

## 2024-02-16 DIAGNOSIS — N481 Balanitis: Secondary | ICD-10-CM | POA: Diagnosis not present

## 2024-03-01 DIAGNOSIS — K080 Exfoliation of teeth due to systemic causes: Secondary | ICD-10-CM | POA: Diagnosis not present

## 2024-03-14 DIAGNOSIS — D492 Neoplasm of unspecified behavior of bone, soft tissue, and skin: Secondary | ICD-10-CM | POA: Diagnosis not present

## 2024-03-14 DIAGNOSIS — L821 Other seborrheic keratosis: Secondary | ICD-10-CM | POA: Diagnosis not present

## 2024-03-14 DIAGNOSIS — B079 Viral wart, unspecified: Secondary | ICD-10-CM | POA: Diagnosis not present

## 2024-03-14 DIAGNOSIS — D225 Melanocytic nevi of trunk: Secondary | ICD-10-CM | POA: Diagnosis not present

## 2024-03-14 DIAGNOSIS — L814 Other melanin hyperpigmentation: Secondary | ICD-10-CM | POA: Diagnosis not present

## 2024-03-27 ENCOUNTER — Ambulatory Visit: Payer: Medicare Other | Admitting: Nurse Practitioner

## 2024-03-27 ENCOUNTER — Encounter: Payer: Self-pay | Admitting: Pulmonary Disease

## 2024-03-27 ENCOUNTER — Ambulatory Visit: Admitting: Pulmonary Disease

## 2024-03-27 VITALS — BP 110/72 | HR 57 | Temp 98.0°F | Ht 66.0 in | Wt 172.0 lb

## 2024-03-27 DIAGNOSIS — Z87891 Personal history of nicotine dependence: Secondary | ICD-10-CM | POA: Diagnosis not present

## 2024-03-27 DIAGNOSIS — R911 Solitary pulmonary nodule: Secondary | ICD-10-CM

## 2024-03-27 NOTE — Progress Notes (Signed)
 Synopsis: Referred in June 2024 for pulmonary nodule by Kip Righter, MD  Subjective:   PATIENT ID: Jeffrey Gallagher GENDER: male DOB: 01-Mar-1956, MRN: [id number]  Chief Complaint  Patient presents with   Follow-up    Ct chest result     This is a 68 y.o. gentleman whom we are seeing for evaluation of lung nodule.  Most recent pulmonary note reviewed.  Several telephone encounters from our office reviewed.  Overall doing well.  No complaints of cough or shortness of breath.  No dyspnea on exertion.  Still working.  Enjoying his work.  Reviewed CT scan 12-2023 to review stable 7.7 mm nodule right middle lobe.  Compared to scan 1 year prior, 2024.  Reviewed serial CT scans in 2005 and 2006 that shows same nodule approximately 6 mm or just below 6 mm on my measurement, review and interpretation.  We discussed at length the likely stability.  Most likely scarring from some prior insult versus slow-growing malignancy.  There is slight change over time, approximate 2 to 3 mm growth over 20 years.  As such a low slow-growing malignancy is still certainly possible.  After shared decision making we agreed to ongoing surveillance.  We discussed after period of years, potentially 3 to 5 years if still stable we may decide to no longer image to this.  We also discussed, this could be stable over 20 years and differences in technique, resolution of CT scan, slice thickness etc. could attribute further changes on images but overall stability in his body.      Past Medical History:  Diagnosis Date   Anemia    Cancer (HCC)    basal cell   GERD (gastroesophageal reflux disease)    Hyperlipidemia    Internal hemorrhoids    Ulcerative colitis, left sided (HCC)    Vitamin D  deficiency      Family History  Problem Relation Age of Onset   Lung cancer Mother    Coronary artery disease Father    Colon cancer Neg Hx    Esophageal cancer Neg Hx    Stomach cancer Neg Hx    Rectal cancer Neg Hx     Pancreatic cancer Neg Hx      Past Surgical History:  Procedure Laterality Date   COLONOSCOPY     x5   LAPAROSCOPIC APPENDECTOMY N/A 02/06/2022   Procedure: APPENDECTOMY LAPAROSCOPIC;  Surgeon: Kinsinger, Herlene Righter, MD;  Location: [facility] OR;  Service: General;  Laterality: N/A;   PROSTATE SURGERY     March 2025   TRANSURETHRAL RESECTION OF PROSTATE N/A 12/29/2023   Procedure: TURP (TRANSURETHRAL RESECTION OF PROSTATE);  Surgeon: Devere Lonni Righter, MD;  Location: [facility];  Service: Urology;  Laterality: N/A;   UPPER GASTROINTESTINAL ENDOSCOPY      Social History   Socioeconomic History   Marital status: Married    Spouse name: Not on file   Number of children: 0   Years of education: Not on file   Highest education level: Not on file  Occupational History    Employer: [employer]  Tobacco Use   Smoking status: Former    Current packs/day: 0.00    Types: Cigarettes    Quit date: 04/20/2003    Years since quitting: 20.9   Smokeless tobacco: Former  Building services engineer status: Never Used  Substance and Sexual Activity   Alcohol use: Yes    Alcohol/week: 7.0 standard drinks of alcohol    Types: 7 Cans  of beer per week    Comment: 2 drinks a day   Drug use: No   Sexual activity: Not on file  Other Topics Concern   Not on file  Social History Narrative   Not on file   Social Drivers of Health   Financial Resource Strain: Not on file  Food Insecurity: No Food Insecurity (12/29/2023)   Hunger Vital Sign    Worried About Running Out of Food in the Last Year: Never true    Ran Out of Food in the Last Year: Never true  Transportation Needs: No Transportation Needs (12/29/2023)   PRAPARE - Administrator, Civil Service (Medical): No    Lack of Transportation (Non-Medical): No  Physical Activity: Not on file  Stress: Not on file  Social Connections: Moderately Integrated (12/29/2023)   Social Connection and Isolation Panel    Frequency of  Communication with Friends and Family: Three times a week    Frequency of Social Gatherings with Friends and Family: Three times a week    Attends Religious Services: Patient declined    Active Member of Clubs or Organizations: No    Attends Banker Meetings: 1 to 4 times per year    Marital Status: Married  Catering manager Violence: Not At Risk (12/29/2023)   Humiliation, Afraid, Rape, and Kick questionnaire    Fear of Current or Ex-Partner: No    Emotionally Abused: No    Physically Abused: No    Sexually Abused: No     No Known Allergies   Outpatient Medications Prior to Visit  Medication Sig Dispense Refill   aspirin EC 81 MG tablet 1 tablet Orally Once a day     Cholecalciferol (VITAMIN D ) 50 MCG (2000 UT) CAPS Take 4,000 Units by mouth daily.     mesalamine  (LIALDA ) 1.2 g EC tablet Take 4 tablets (4.8 gm)  Daily  for Colitis                              /                                                                   TAKE                                         BY                                                 MOUTH 360 tablet 3   omeprazole  (PRILOSEC) 20 MG capsule Take  1 capsule  2 x / day to  Prevent Heartburn &  Indigestion                                 /  TAKE                                         BY                                                 MOUTH 180 capsule 3   rosuvastatin  (CRESTOR ) 20 MG tablet Take  1 tablet  Daily  for Cholesterol 90 tablet 3   oxybutynin  (DITROPAN ) 5 MG tablet Take 1 tablet (5 mg total) by mouth every 8 (eight) hours as needed for bladder spasms. 30 tablet 1   phenazopyridine  (PYRIDIUM ) 200 MG tablet Take 1 tablet (200 mg total) by mouth 3 (three) times daily as needed (for pain with urination). 30 tablet 0   No facility-administered medications prior to visit.   Review of systems: N/AA   Objective:  Physical exam: General: Sitting in chair, no acute  distress Eyes: EOMI, icterus Neck: Supple, JVP Pulmonary: Clear, normal rate Cardiovascular: Warm, no edema noted Abdomen: Nondistended Neuro: Normal gait, no weakness Psych: Normal mood, full affect   Vitals:   03/27/24 0839  BP: 110/72  Pulse: (!) 57  Temp: 98 F (36.7 C)  TempSrc: Oral  SpO2: 99%  Weight: 172 lb (78 kg)  Height: 5' 6 (1.676 m)   99% on RA BMI Readings from Last 3 Encounters:  03/27/24 27.76 kg/m  12/29/23 27.12 kg/m  12/28/23 27.21 kg/m   Wt Readings from Last 3 Encounters:  03/27/24 172 lb (78 kg)  12/29/23 168 lb (76.2 kg)  12/28/23 168 lb 9.6 oz (76.5 kg)     CBC    Component Value Date/Time   WBC 5.5 12/28/2023 0753   RBC 4.13 (L) 12/28/2023 0753   HGB 12.6 (L) 12/30/2023 0409   HCT 38.1 (L) 12/30/2023 0409   PLT 300 12/28/2023 0753   MCV 97.6 12/28/2023 0753   MCH 31.7 12/28/2023 0753   MCHC 32.5 12/28/2023 0753   RDW 13.2 12/28/2023 0753   LYMPHSABS 1,764 02/09/2023 1632   MONOABS 0.5 09/15/2022 1020   EOSABS 80 06/14/2023 1514   BASOSABS 23 06/14/2023 1514     Chest Imaging:  May 2024 CT chest: 8 mm right middle lobe nodule slightly changed in size.  June 2025: Stable 8 mm right middle lobe nodule  Serial CT scans 2005- 2006 demonstrate 5 to 6 mm right middle lobe nodule  The patient's images have been independently reviewed by me.    Pulmonary Functions Testing Results:     No data to display          FeNO:   Pathology:   Echocardiogram:   Heart Catheterization:     Assessment & Plan:     ICD-10-CM   1. Lung nodule  R91.1 CT Super D Chest Wo Contrast      Lung nodule: Present since at least 2005.  20 years.  Overall stable.  Possible 2 to 3 mm growth over the course of 20 years.  Overall stable 2024-2025.  Although differences in imaging techniques could account for these changes.  Given perceived slow growth over time, we decided to continue to pursue yearly imaging over the course of the next  couple of years.  We may decide to stop imaging at some  point in time if continues to be stable.  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  Encouraged ongoing abstinence from smoking.    Current Outpatient Medications:    aspirin EC 81 MG tablet, 1 tablet Orally Once a day, Disp: , Rfl:    Cholecalciferol (VITAMIN D ) 50 MCG (2000 UT) CAPS, Take 4,000 Units by mouth daily., Disp: , Rfl:    mesalamine  (LIALDA ) 1.2 g EC tablet, Take 4 tablets (4.8 gm)  Daily  for Colitis                              /                                                                   TAKE                                         BY                                                 MOUTH, Disp: 360 tablet, Rfl: 3   omeprazole  (PRILOSEC) 20 MG capsule, Take  1 capsule  2 x / day to  Prevent Heartburn &  Indigestion                                 /                                                                   TAKE                                         BY                                                 MOUTH, Disp: 180 capsule, Rfl: 3   rosuvastatin  (CRESTOR ) 20 MG tablet, Take  1 tablet  Daily  for Cholesterol, Disp: 90 tablet, Rfl: 3   oxybutynin  (DITROPAN ) 5 MG tablet, Take 1 tablet (5 mg total) by mouth every 8 (eight) hours as needed for bladder spasms., Disp: 30 tablet, Rfl: 1   phenazopyridine  (PYRIDIUM ) 200 MG tablet, Take 1 tablet (200 mg total) by mouth 3 (three) times daily as needed (for pain with urination)., Disp: 30 tablet, Rfl: 0    Donnice JONELLE Annella ROLLA [facility] 03/27/2024 9:11 AM

## 2024-03-27 NOTE — Patient Instructions (Signed)
 CT scan stable, we will repeated in 1 year, June 2026 and follow-up to discuss results in the office

## 2024-04-28 ENCOUNTER — Ambulatory Visit: Payer: Self-pay | Admitting: Surgery

## 2024-04-28 DIAGNOSIS — D1721 Benign lipomatous neoplasm of skin and subcutaneous tissue of right arm: Secondary | ICD-10-CM | POA: Diagnosis not present

## 2024-04-28 NOTE — H&P (Signed)
 Subjective    Chief Complaint: LTFU (Lipoma of right upper extremity/)       History of Present Illness: Jeffrey Gallagher is a 68 y.o. male who is seen today as an office consultation at the request of Dr. Arch for evaluation of LTFU (Lipoma of right upper extremity/) .     This is a 67 year old male who presents with a 30-year history of a slowly enlarging mass on the medial part of his right elbow.  Recently this has become large enough that is causing intermittent numbness shooting down towards his hand.  He was examined by his PCP who felt that this likely represented a lipoma causing some pressure.  He is referred to us  to discuss excision.   The patient has a history of ulcerative colitis. Review of Systems: A complete review of systems was obtained from the patient.  I have reviewed this information and discussed as appropriate with the patient.  See HPI as well for other ROS.   Review of Systems  Constitutional: Negative.   HENT: Negative.    Eyes: Negative.   Respiratory: Negative.    Cardiovascular: Negative.   Gastrointestinal:  Positive for abdominal pain.  Genitourinary: Negative.   Musculoskeletal: Negative.   Skin: Negative.   Neurological: Negative.   Endo/Heme/Allergies: Negative.   Psychiatric/Behavioral: Negative.          Medical History: Past Medical History  History reviewed. No pertinent past medical history.     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        Family History       Family History  Problem Relation Age of Onset   High blood pressure (Hypertension) Sister          Tobacco Use History  Social History        Tobacco Use  Smoking Status Former   Types: Cigarettes  Smokeless Tobacco Not on file        Social History  Social History         Socioeconomic History   Marital status: Married  Tobacco Use   Smoking status: Former      Types: Cigarettes  Vaping Use   Vaping status: Unknown  Substance and Sexual Activity   Alcohol use: Yes   Drug use: Never    Social Drivers of Health        Food Insecurity: No Food Insecurity (12/29/2023)    Received from [facility]    Hunger Vital Sign     Within the past 12 months, you worried that your food would run out before you got the money to buy more.:  Never true     Within the past 12 months, the food you bought just didn't last and you didn't have money to get more.: Never true  Transportation Needs: No Transportation Needs (12/29/2023)    Received from [facility]     In the past 12 months, has lack of transportation kept you from medical appointments or from getting medications?: No     In the past 12 months, has lack of transportation kept you from meetings, work, or from getting things needed for daily living?: No  Social Connections: Moderately Integrated (12/29/2023)    Received from [facility]    Social Connection and Isolation Panel     In a typical week, how many times do you talk on the phone with family, friends, or neighbors?: Three times a week     How often do you get together with friends or relatives?: Three times a week     How often do you attend church or religious services?: Patient declined     Do you belong to any clubs or organizations such as church groups, unions, fraternal or  athletic groups, or school groups?: No     How often do you attend meetings of the clubs or organizations you belong to?: 1 to 4 times per year     Are you married, widowed, divorced, separated, never married, or living with a partner?: Married  Housing Stability: Unknown (04/28/2024)    Housing Stability Vital Sign     Homeless in the Last Year: No        Objective:         Vitals:    04/28/24 0947  BP: 128/70  Pulse: 81  Resp: 16  Temp: 36.6 C (97.8 F)  SpO2: 96%  Weight: 80.1 kg (176 lb 9.6 oz)  Height: 170.2 cm (5' 7)  PainSc: 1   PainLoc: Arm    Body mass index is 27.66 kg/m.   Physical Exam    Constitutional:  WDWN in NAD, conversant, no obvious deformities; lying in bed comfortably Eyes:  Pupils equal, round; sclera anicteric; moist conjunctiva; no lid lag HENT:  Oral mucosa moist; good dentition  Neck:  No masses palpated, trachea midline; no thyromegaly Lungs:  CTA bilaterally; normal respiratory effort CV:  Regular rate and rhythm; no murmurs; extremities well-perfused with no edema Abd:  +bowel sounds, soft, non-tender, no palpable organomegaly; no palpable hernias Musc:  Normal gait; no apparent clubbing or cyanosis in extremities Lymphatic:  No palpable cervical or axillary lymphadenopathy Skin:  Warm, dry; no sign of jaundice Right medial elbow shows a protruding 3 cm subcutaneous mass.  When the patient's muscles are relaxed, this mass is quite soft.  There are 2 subcutaneous veins that coursed over this area.  The mass seems to be fairly well-demarcated.  It does not appear to be fixed to the underlying tissue.  This seems to be outside the joint. 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  Potential risks, benefits, alternative treatments, and expected outcomes have been explained.  All of the patient's questions at this time have been answered.  The likelihood of reaching  the patient's treatment goal is good.  The patient understands the proposed surgical procedure and wishes to proceed.         Jeffrey Valadez DEWAYNE LIMA, Jeffrey Gallagher  04/28/2024 11:32 AM

## 2024-05-02 DIAGNOSIS — K080 Exfoliation of teeth due to systemic causes: Secondary | ICD-10-CM | POA: Diagnosis not present

## 2024-05-15 DIAGNOSIS — N401 Enlarged prostate with lower urinary tract symptoms: Secondary | ICD-10-CM | POA: Diagnosis not present

## 2024-05-15 DIAGNOSIS — N35811 Other urethral stricture, male, meatal: Secondary | ICD-10-CM | POA: Diagnosis not present

## 2024-05-15 DIAGNOSIS — R3912 Poor urinary stream: Secondary | ICD-10-CM | POA: Diagnosis not present

## 2024-05-23 DIAGNOSIS — K080 Exfoliation of teeth due to systemic causes: Secondary | ICD-10-CM | POA: Diagnosis not present

## 2024-06-13 DIAGNOSIS — L821 Other seborrheic keratosis: Secondary | ICD-10-CM | POA: Diagnosis not present

## 2024-06-13 DIAGNOSIS — L308 Other specified dermatitis: Secondary | ICD-10-CM | POA: Diagnosis not present

## 2024-06-13 DIAGNOSIS — L814 Other melanin hyperpigmentation: Secondary | ICD-10-CM | POA: Diagnosis not present

## 2024-06-13 DIAGNOSIS — L570 Actinic keratosis: Secondary | ICD-10-CM | POA: Diagnosis not present

## 2024-06-13 DIAGNOSIS — Z08 Encounter for follow-up examination after completed treatment for malignant neoplasm: Secondary | ICD-10-CM | POA: Diagnosis not present

## 2024-06-13 DIAGNOSIS — D225 Melanocytic nevi of trunk: Secondary | ICD-10-CM | POA: Diagnosis not present

## 2024-06-14 ENCOUNTER — Other Ambulatory Visit: Payer: Self-pay | Admitting: Surgery

## 2024-06-14 DIAGNOSIS — D1721 Benign lipomatous neoplasm of skin and subcutaneous tissue of right arm: Secondary | ICD-10-CM | POA: Diagnosis not present

## 2024-06-15 LAB — SURGICAL PATHOLOGY

## 2024-07-04 ENCOUNTER — Encounter: Payer: Medicare Other | Admitting: Internal Medicine
